# Patient Record
Sex: Male | Born: 1945 | Race: White | Hispanic: No | Marital: Single | Smoking: Current every day smoker
Health system: Southern US, Community
[De-identification: ages and names within clinical notes are randomized; demographics above are authoritative.]

## PROBLEM LIST (undated history)

## (undated) DIAGNOSIS — C73 Malignant neoplasm of thyroid gland: Secondary | ICD-10-CM

## (undated) DIAGNOSIS — N189 Chronic kidney disease, unspecified: Secondary | ICD-10-CM

## (undated) DIAGNOSIS — D369 Benign neoplasm, unspecified site: Secondary | ICD-10-CM

## (undated) DIAGNOSIS — E119 Type 2 diabetes mellitus without complications: Secondary | ICD-10-CM

## (undated) DIAGNOSIS — K644 Residual hemorrhoidal skin tags: Secondary | ICD-10-CM

## (undated) DIAGNOSIS — D126 Benign neoplasm of colon, unspecified: Secondary | ICD-10-CM

## (undated) DIAGNOSIS — J449 Chronic obstructive pulmonary disease, unspecified: Secondary | ICD-10-CM

## (undated) HISTORY — DX: Chronic obstructive pulmonary disease, unspecified: J44.9

## (undated) HISTORY — DX: Residual hemorrhoidal skin tags: K64.4

## (undated) HISTORY — DX: Malignant neoplasm of thyroid gland: C73

## (undated) HISTORY — DX: Benign neoplasm, unspecified site: D36.9

## (undated) HISTORY — DX: Benign neoplasm of colon, unspecified: D12.6

## (undated) HISTORY — PX: THYROIDECTOMY: SHX17

## (undated) HISTORY — DX: Chronic kidney disease, unspecified: N18.9

---

## 2000-03-11 ENCOUNTER — Other Ambulatory Visit: Admission: RE | Admit: 2000-03-11 | Discharge: 2000-03-11 | Payer: Self-pay | Admitting: Internal Medicine

## 2000-03-11 ENCOUNTER — Encounter (INDEPENDENT_AMBULATORY_CARE_PROVIDER_SITE_OTHER): Payer: Self-pay | Admitting: Specialist

## 2002-02-17 ENCOUNTER — Ambulatory Visit (HOSPITAL_BASED_OUTPATIENT_CLINIC_OR_DEPARTMENT_OTHER): Admission: RE | Admit: 2002-02-17 | Discharge: 2002-02-17 | Payer: Self-pay | Admitting: *Deleted

## 2005-01-10 ENCOUNTER — Encounter: Admission: RE | Admit: 2005-01-10 | Discharge: 2005-01-10 | Payer: Self-pay | Admitting: Otolaryngology

## 2005-01-10 IMAGING — CT CT NECK W/ CM
1 of 2 series · 9 of 14 positions shown, 12 images · IV contrast (OMNIPAQUE 75)
Comparison: None.

CLINICAL DATA: Left neck mass.
NECK CT WITH CONTRAST:
TECHNIQUE: Multidetector CT imaging of the neck was performed following the standard protocol during administration of intravenous contrast.
Contrast:  75 cc of Omnipaque 300.

[Series 2: neck · axial · 0.39mm/px · z∈[-32,+181]mm · 9 of 73 slices shown, 12 images]
[im 8/73  soft-tissue]
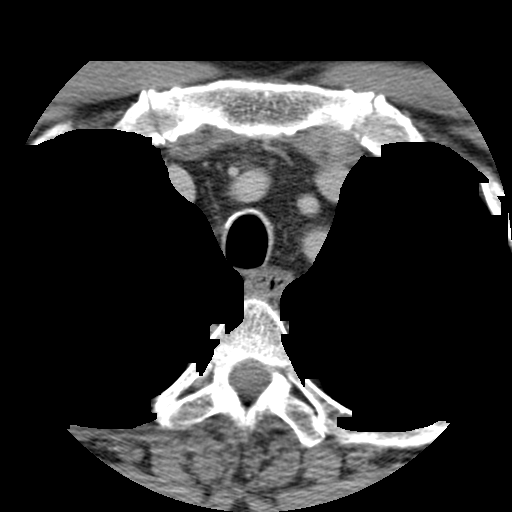
[im 8/73  bone]
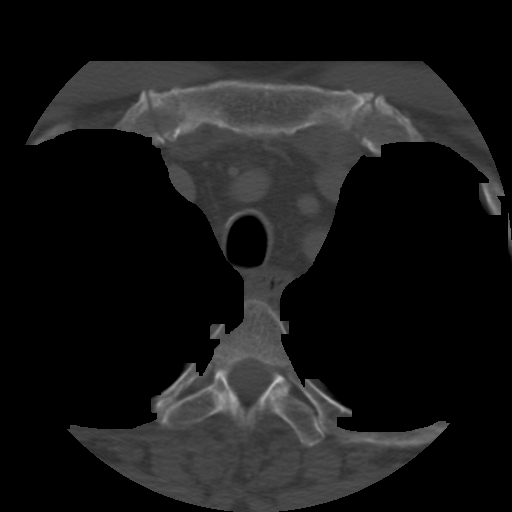
[im 15/73  bone]
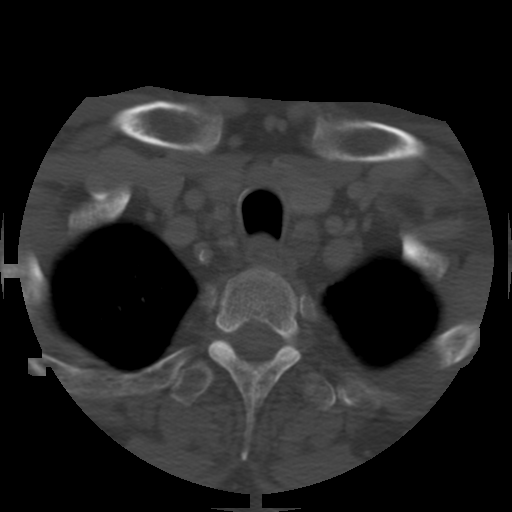
[im 22/73  bone]
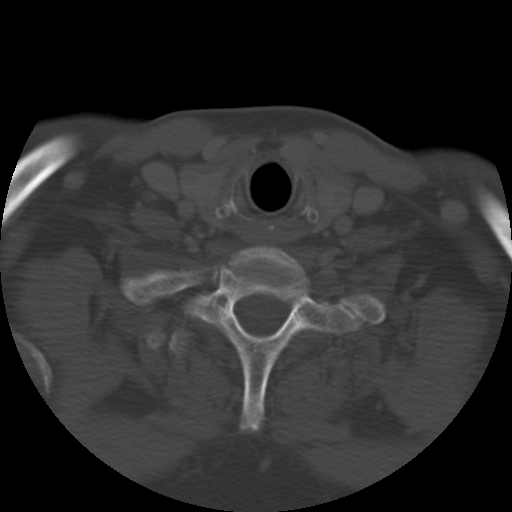
[im 29/73  bone]
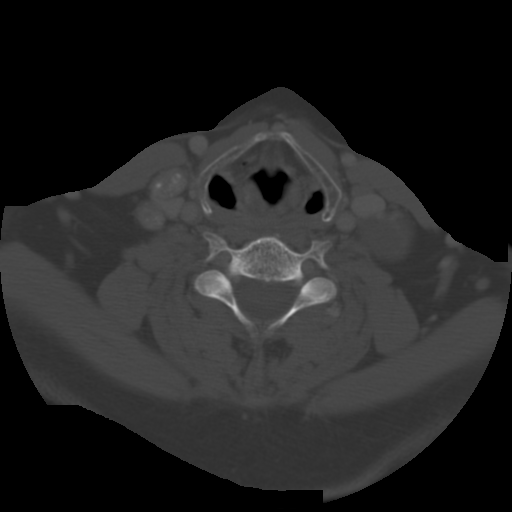
[im 37/73  soft-tissue]
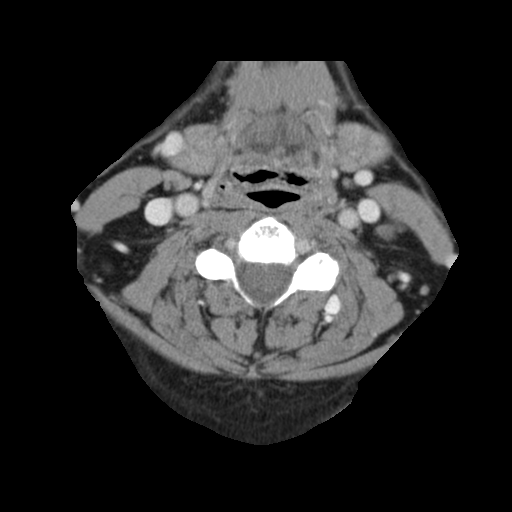
[im 37/73  bone]
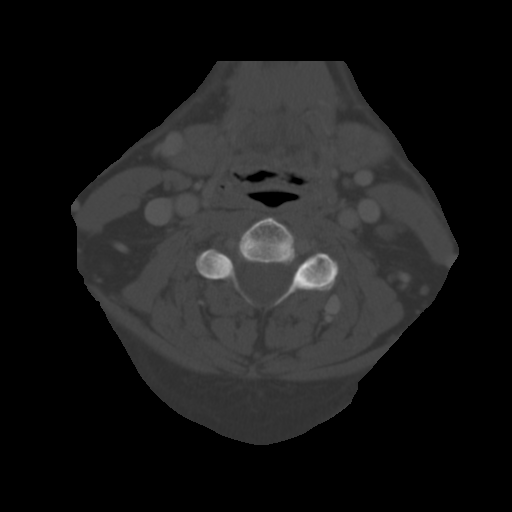
[im 44/73  bone]
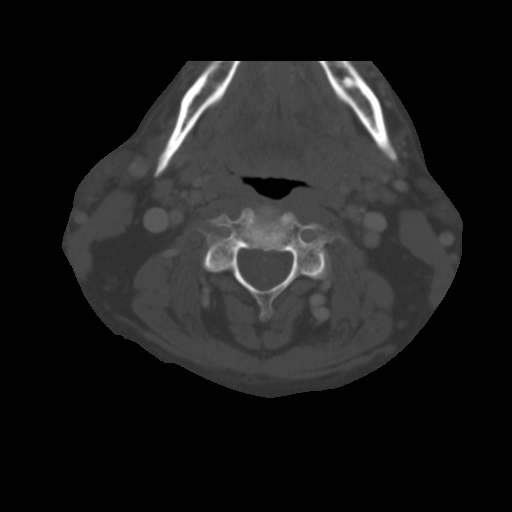
[im 51/73  bone]
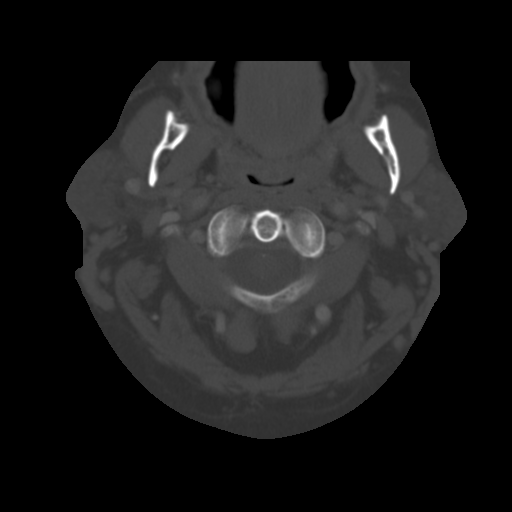
[im 58/73  bone]
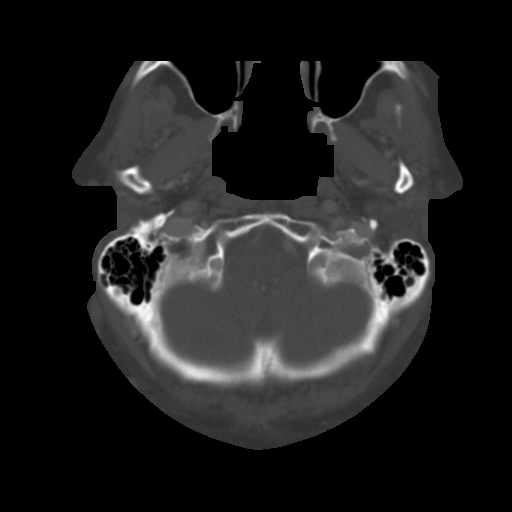
[im 65/73  soft-tissue]
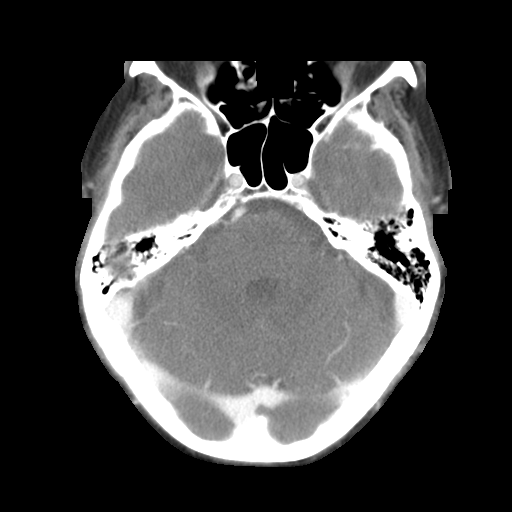
[im 65/73  bone]
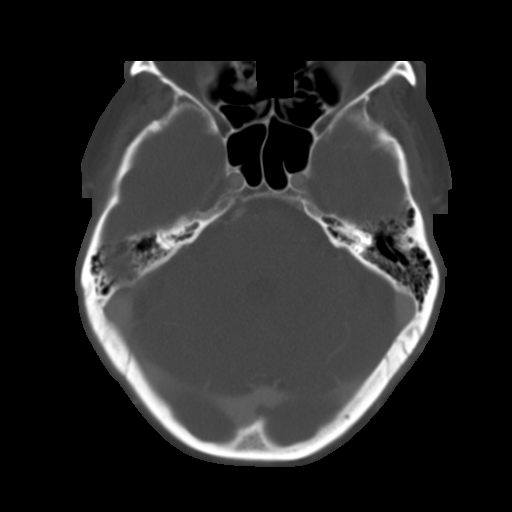

[9 of 14 positions shown; findings below may reference images not displayed]

FINDINGS: CT confirms the presence of a round mass in the left neck which is  at the level of the length of the thyroid cartilage.  The mass is spherical in shape.  It measures approximately 2.7 x 2.6 cm in diameter.  It is, for the most part, homogeneous with Hounsfield units in the high 20s.  However, anteriorly along the lower aspect of the mass, there is either some calcification or enhancement.  The sternocleidomastoid drapes over the mass.  It is directly adjacent to the outer aspect of the jugular vein.  On the contralateral side, there are some enlarged  lymph nodes with stippled calcifications.   The largest measures about 22 x 12 mm ,anterior to the great vessels.  A second node measuring about 9 x 12 mm is lateral to the jugular vein.  These nodes have stippled calcification that looks different from the left side.  Nonetheless, the mass is most likely to be a necrotic lymph node.  It is lower than one would anticipate for a branchial cleft cyst ,although that is still possible.  No other masses are identified.  No lesions of the airway or prevertebral space.  The thyroid gland appears normal except for a tiny calcification seen in the anterior aspect of the isthmus on image #57. This measures about 5 mm.
IMPRESSION: 1.  Left neck mass, as described above.  This is most likely a necrotic lymph node. The etiology of the presumed adenopathy is not apparent.
2.  There are enlarged lymph nodes on the right that have stippled calcification.
3.  No other masses or significant findings.

## 2005-01-22 ENCOUNTER — Encounter: Admission: RE | Admit: 2005-01-22 | Discharge: 2005-01-22 | Payer: Self-pay | Admitting: Otolaryngology

## 2005-01-22 IMAGING — CT CT CHEST W/O CM
1 series · 15 of 33 positions shown, 19 images · IV contrast (agent unspecified)
Comparison: none

CLINICAL DATA: Recent thyroid carcinoma with resection.  Evaluate for metastatic involvement or adenopathy.
 CHEST CT WITHOUT CONTRAST:
TECHNIQUE: Multidetector CT imaging of the chest was performed following the standard protocol without IV contrast.
 On lung window images, no evidence of lung metastases is seen.  No effusion is noted.  The thyroid gland appears normal in size.  There is a prominent node just posterior to the left lobe of thyroid measuring 16 x 9 mm and follow up CT is recommended.  A few small nodes are also noted in the midline just beneath the thyroid.  There is also a partially calcified node on image #16 adjacent to the trachea of 10 x 12 mm, worrisome for metastatic involvement.  No mediastinal or hilar nodes are evident.   On the last cut, there appears to be calcification of the wall of the gallbladder which may indicate porcelain gallbladder.

[Series 2: chest w/o · axial · non-contrast · 0.64mm/px · z∈[-381,-91]mm · 15 of 70 slices shown, 19 images]
[im 6/70  mediastinal]
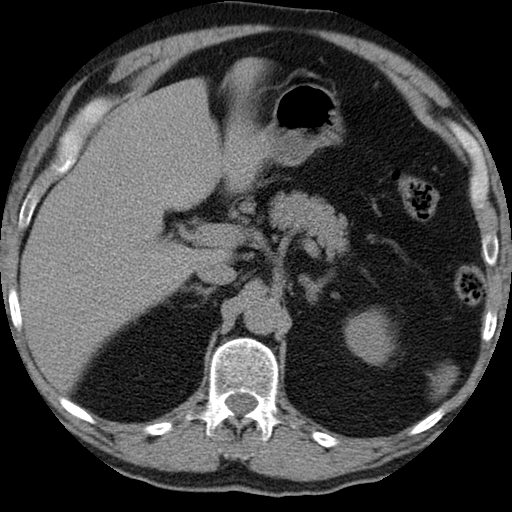
[im 6/70  lung]
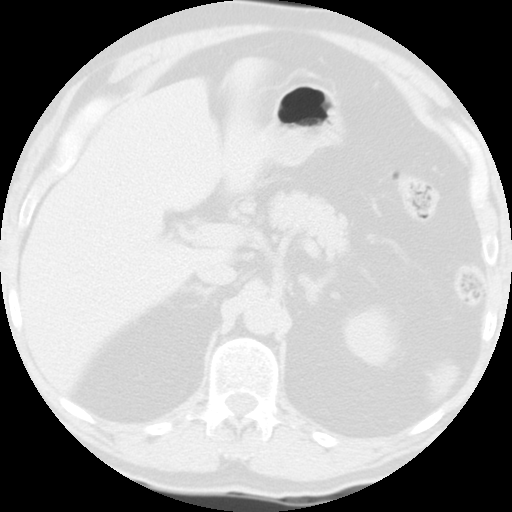
[im 11/70  lung]
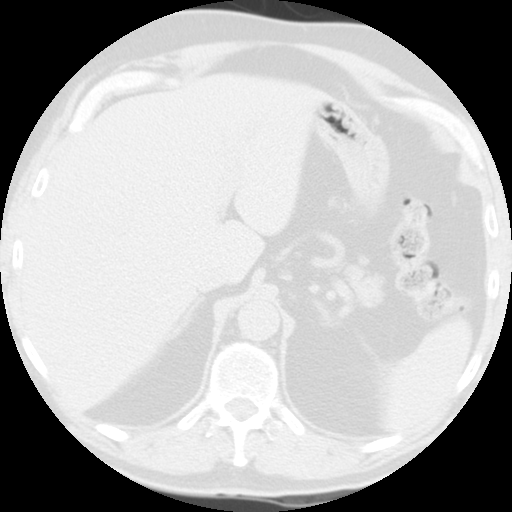
[im 14/70  lung]
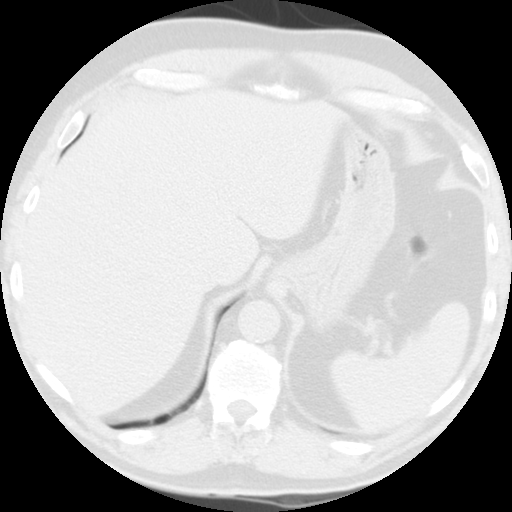
[im 18/70  lung]
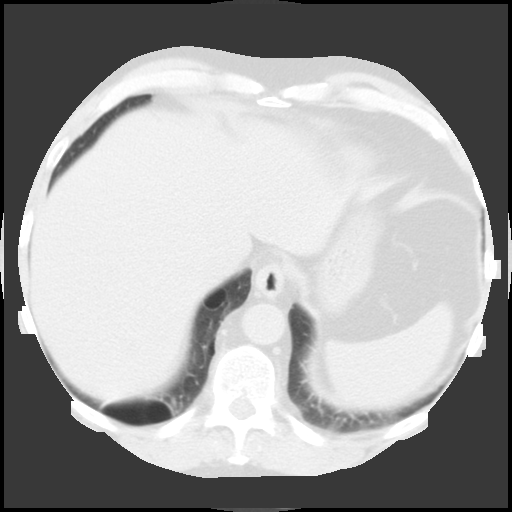
[im 24/70  mediastinal]
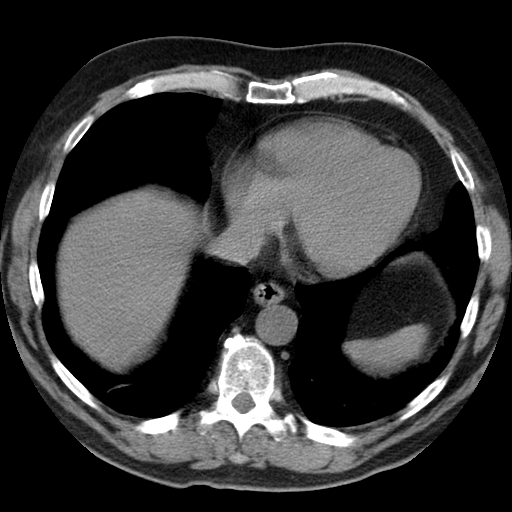
[im 24/70  lung]
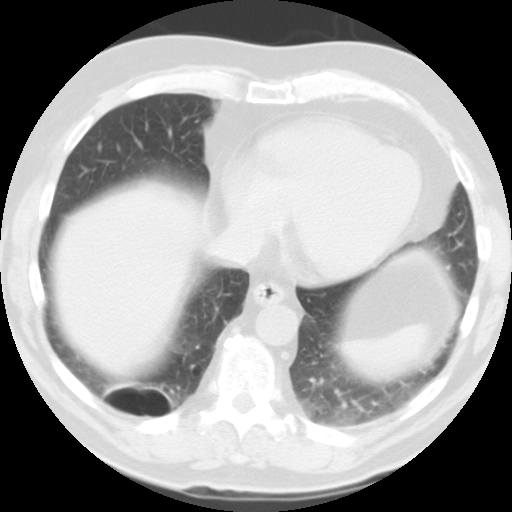
[im 28/70  lung]
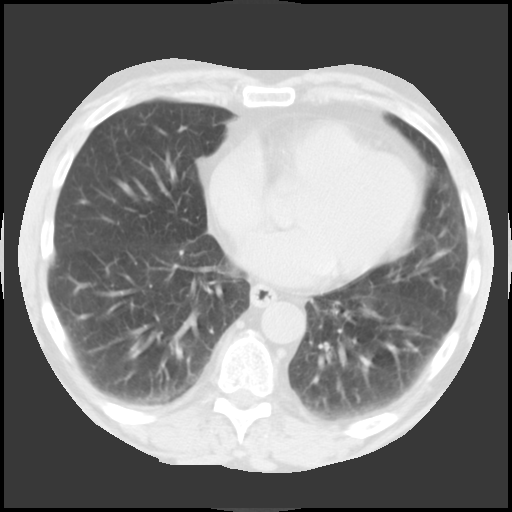
[im 31/70  lung]
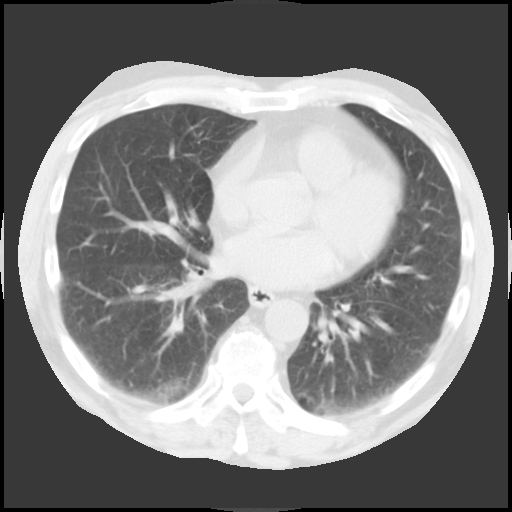
[im 36/70  lung]
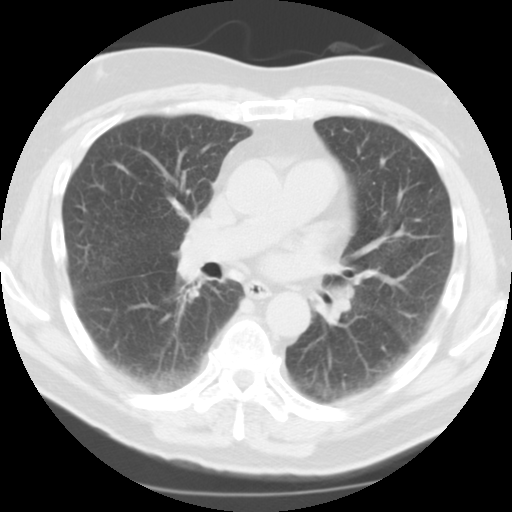
[im 39/70  mediastinal]
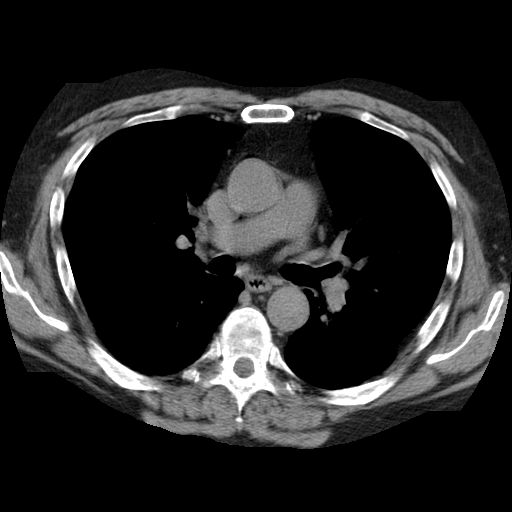
[im 39/70  lung]
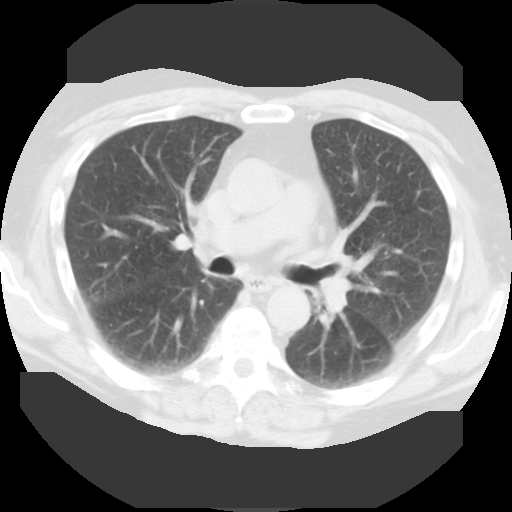
[im 42/70  lung]
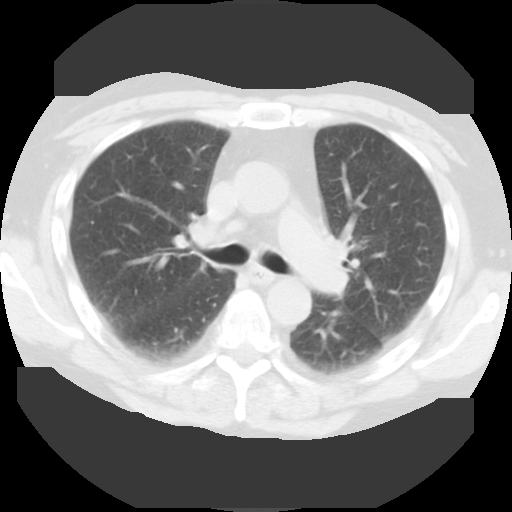
[im 47/70  lung]
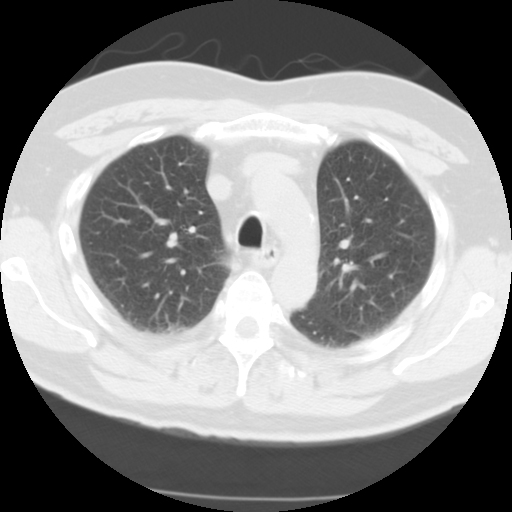
[im 52/70  lung]
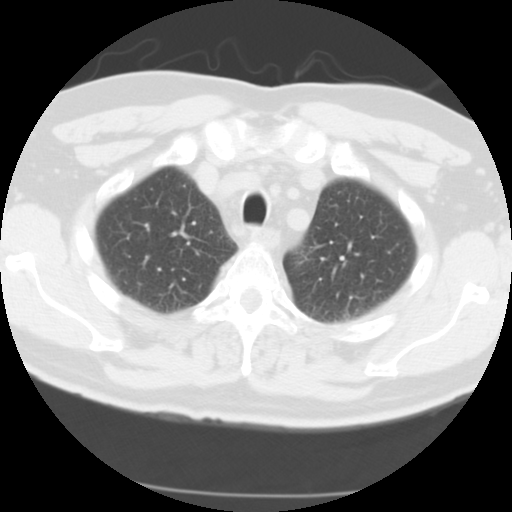
[im 56/70  mediastinal]
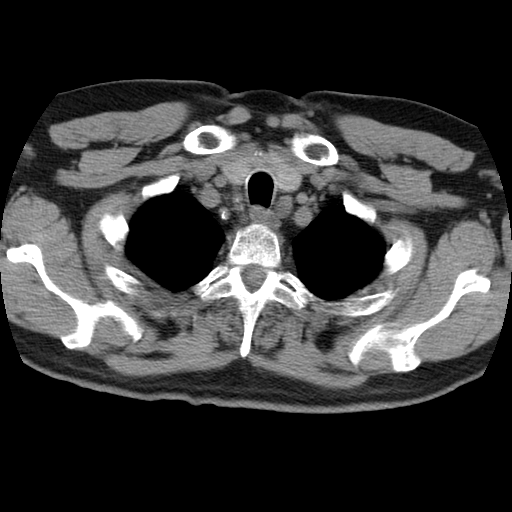
[im 56/70  lung]
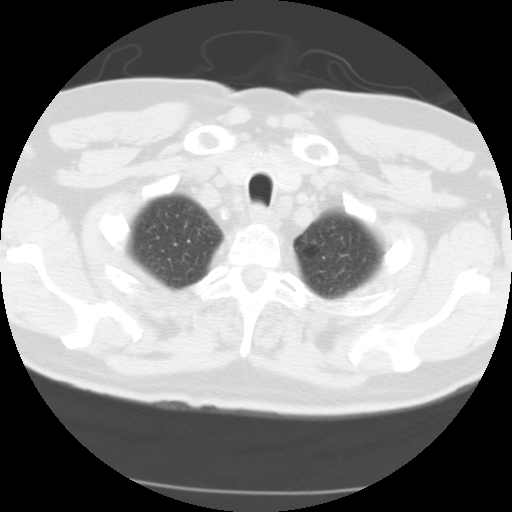
[im 59/70  lung]
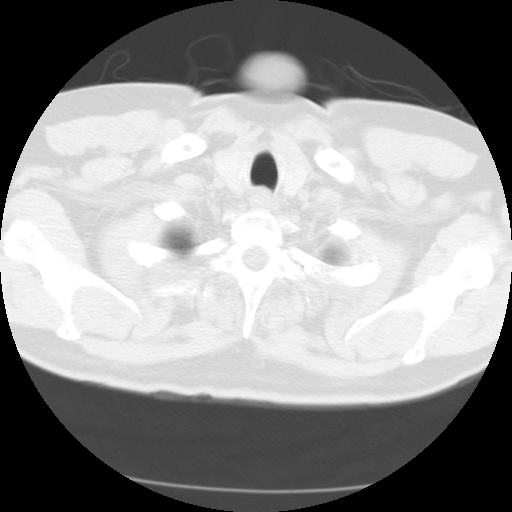
[im 64/70  lung]
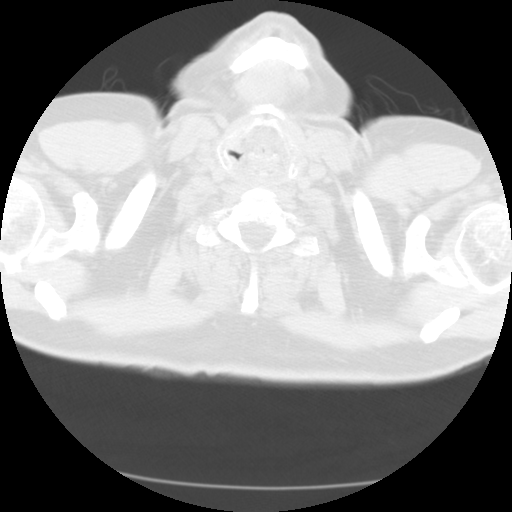

[15 of 33 positions shown; findings below may reference images not displayed]

IMPRESSION: 1.  There are some prominent nodes adjacent and inferior to the thyroid gland, and therefore metastatic adenopathy cannot be excluded.  No mediastinal or hilar adenopathy is seen.
 2.  No lung lesions are seen.
 3.  Question porcelain gallbladder on last image obtained.

## 2005-02-05 ENCOUNTER — Inpatient Hospital Stay (HOSPITAL_COMMUNITY): Admission: RE | Admit: 2005-02-05 | Discharge: 2005-02-07 | Payer: Self-pay | Admitting: Otolaryngology

## 2005-02-05 ENCOUNTER — Encounter (INDEPENDENT_AMBULATORY_CARE_PROVIDER_SITE_OTHER): Payer: Self-pay | Admitting: Specialist

## 2005-04-29 ENCOUNTER — Encounter (HOSPITAL_COMMUNITY): Admission: RE | Admit: 2005-04-29 | Discharge: 2005-07-28 | Payer: Self-pay | Admitting: Endocrinology

## 2005-05-02 ENCOUNTER — Encounter (HOSPITAL_COMMUNITY): Admission: RE | Admit: 2005-05-02 | Discharge: 2005-07-31 | Payer: Self-pay | Admitting: Endocrinology

## 2006-03-31 ENCOUNTER — Encounter: Admission: RE | Admit: 2006-03-31 | Discharge: 2006-03-31 | Payer: Self-pay | Admitting: Otolaryngology

## 2007-09-17 ENCOUNTER — Emergency Department (HOSPITAL_COMMUNITY): Admission: EM | Admit: 2007-09-17 | Discharge: 2007-09-17 | Payer: Self-pay | Admitting: Family Medicine

## 2008-11-17 ENCOUNTER — Encounter (INDEPENDENT_AMBULATORY_CARE_PROVIDER_SITE_OTHER): Payer: Self-pay | Admitting: *Deleted

## 2008-12-14 ENCOUNTER — Ambulatory Visit: Payer: Self-pay | Admitting: Internal Medicine

## 2008-12-28 ENCOUNTER — Encounter: Payer: Self-pay | Admitting: Internal Medicine

## 2008-12-28 ENCOUNTER — Ambulatory Visit: Payer: Self-pay | Admitting: Internal Medicine

## 2009-01-01 ENCOUNTER — Encounter: Payer: Self-pay | Admitting: Internal Medicine

## 2009-12-19 ENCOUNTER — Encounter: Admission: RE | Admit: 2009-12-19 | Discharge: 2009-12-19 | Payer: Self-pay | Admitting: Family Medicine

## 2009-12-19 IMAGING — US US ABDOMEN COMPLETE
1 series · 13 of 25 positions shown · non-contrast
Comparison: None.

CLINICAL DATA: Abdominal pain and bloating, history of thyroid
carcinoma with  thyroidectomy previously

COMPLETE ABDOMINAL ULTRASOUND

[Series 1: us abdomen complete · 0.43mm/px · 13 of 81 slices shown]
[im 1/81]
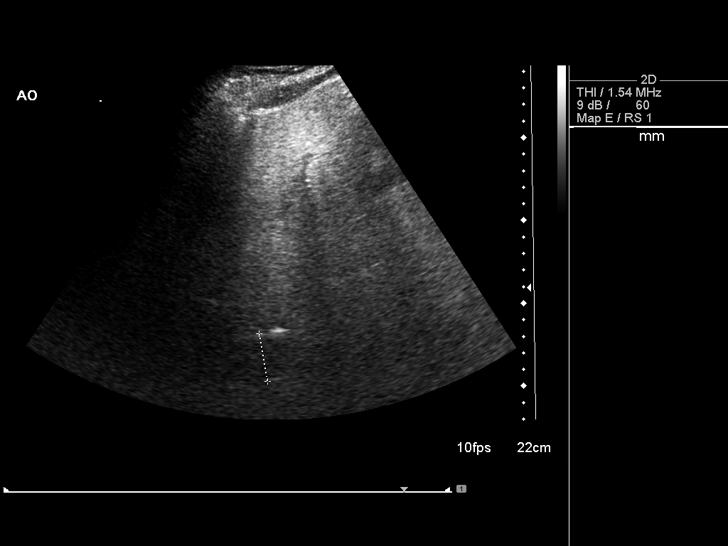
[im 7/81]
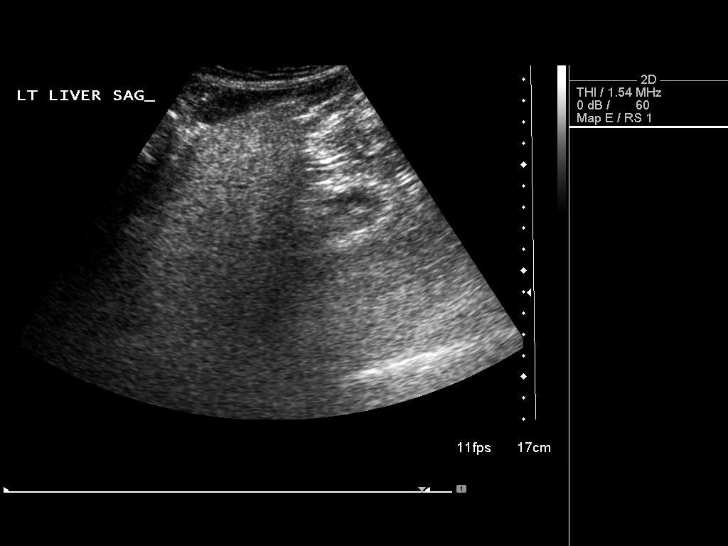
[im 14/81]
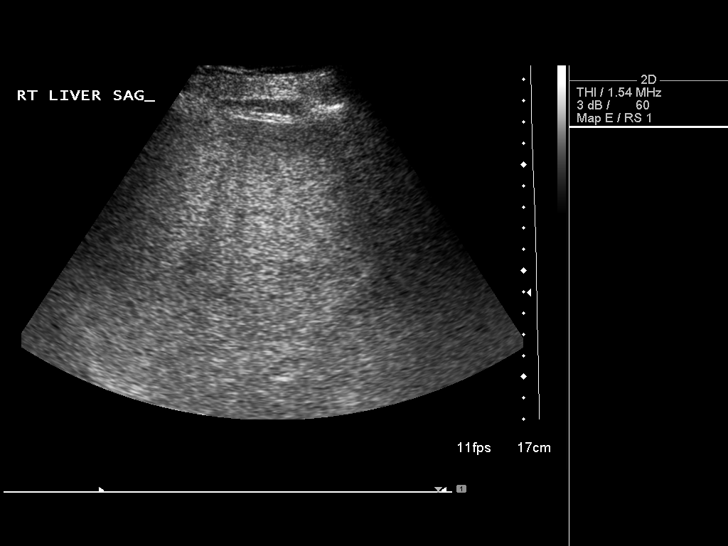
[im 21/81]
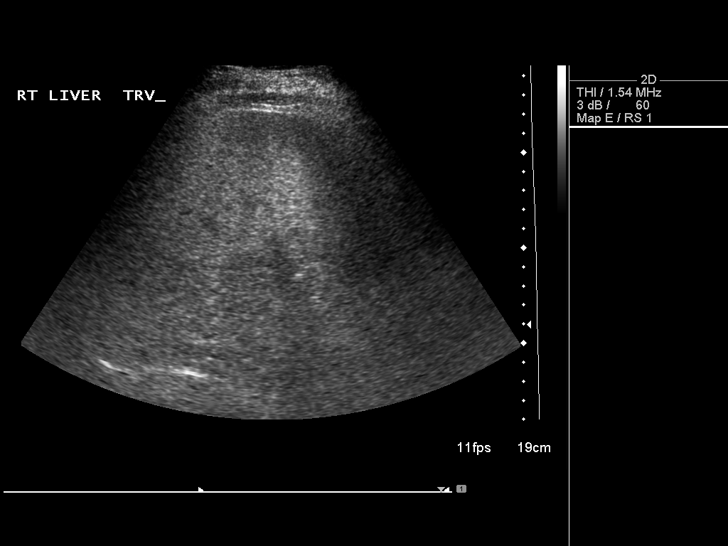
[im 27/81]
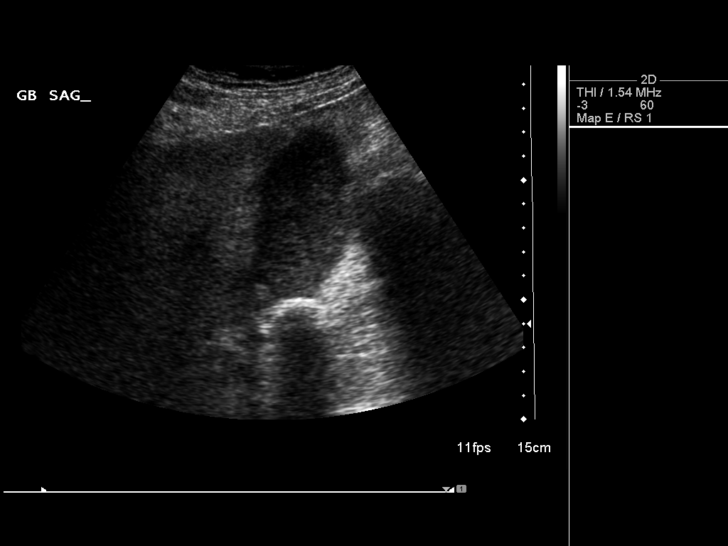
[im 34/81]
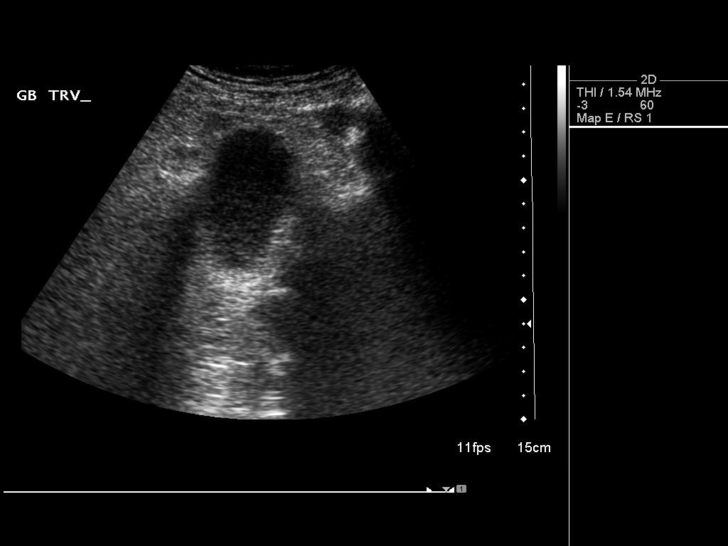
[im 41/81]
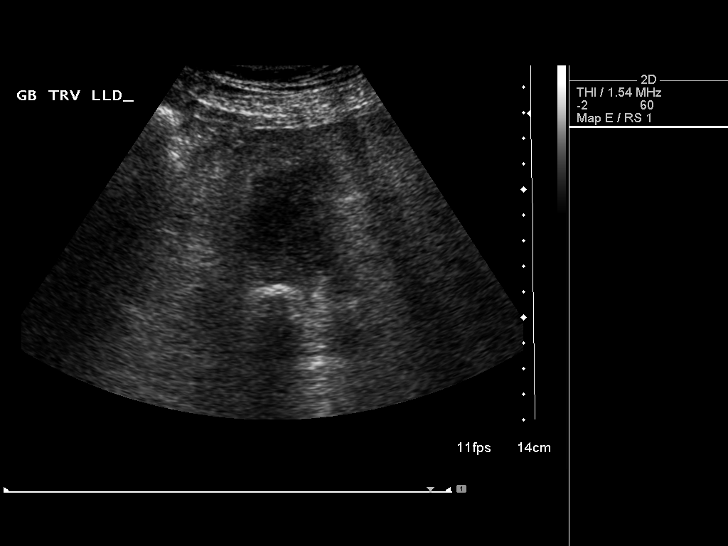
[im 47/81]
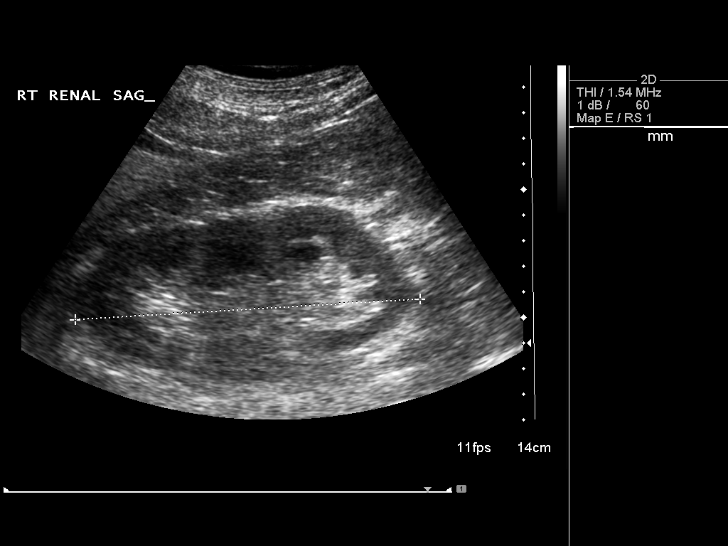
[im 54/81]
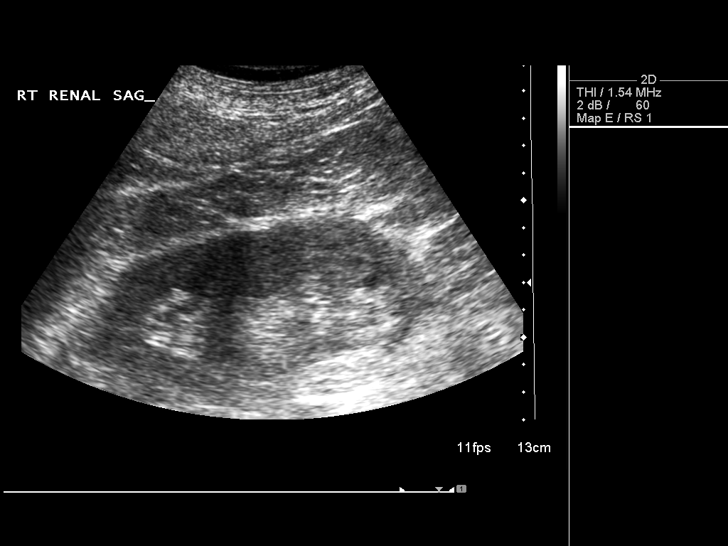
[im 61/81]
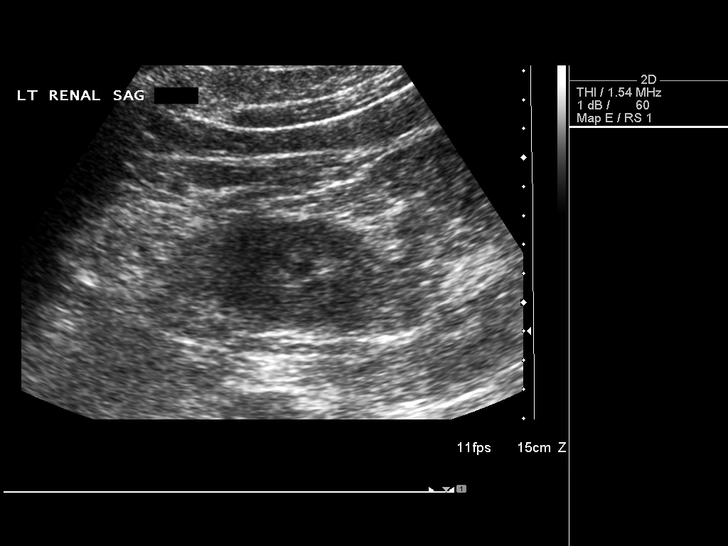
[im 67/81]
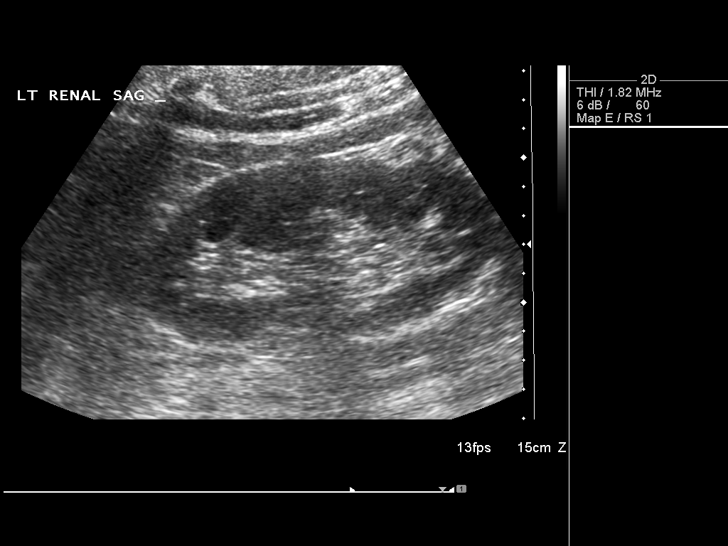
[im 74/81]
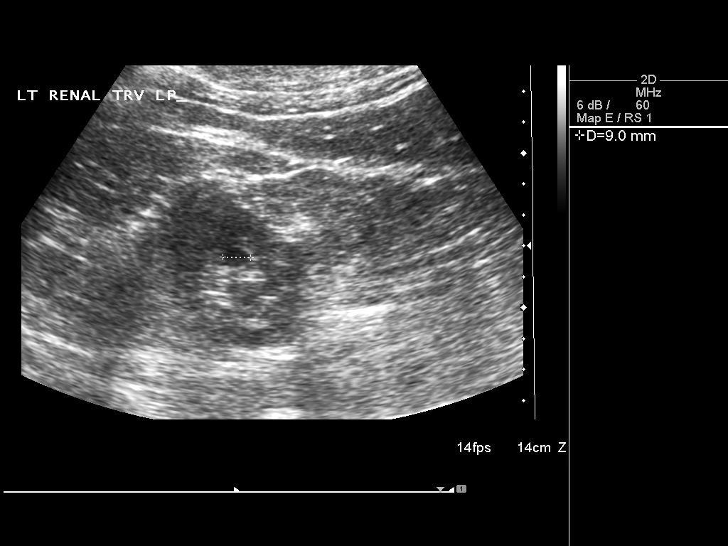
[im 81/81]
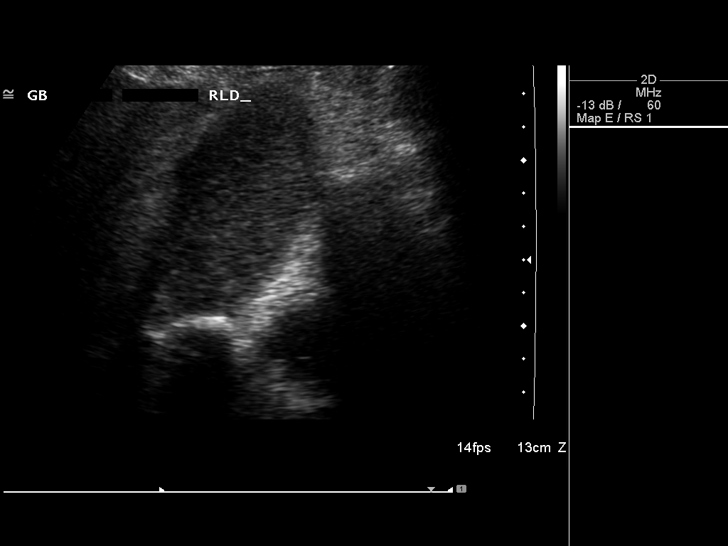

[13 of 25 positions shown; findings below may reference images not displayed]

FINDINGS: Gallbladder:  There is a 1.9 cm gallstone which appears to be
lodged in the neck of the gallbladder with gallbladder sludge
present.  However, no pain is present over the gallbladder with
compression.  The gallbladder wall is not thickened currently.

Common bile duct:  The common bile duct is normal measuring 6.4 mm
in diameter.

Liver:  The liver is diffusely echogenic consistent with fatty
infiltration.  No ductal dilatation is seen.

IVC:  The IVC is obscured by bowel gas.

Pancreas:  The pancreas is largely obscured by bowel gas.

Spleen:  The spleen is normal measuring six point of centimeters
sagittally.

Right Kidney:  No hydronephrosis is seen.  The right kidney
measures 13.5 cm sagittally.  A cyst is noted in the lower pole of
1.3 x 0.9 x 1.2 cm.

Left Kidney:  No hydronephrosis.  The left kidney measures 13.4 cm
sagittally.  Too small cysts are present no larger than 1.3 cm in
maximum diameter.

Abdominal aorta:  The abdominal aorta is normal in caliber although
distally obscured by bowel gas.
IMPRESSION: 1.  1.9 cm gallstone appears lodged in the neck of the gallbladder
with gallbladder sludge.  No pain is present over gallbladder with
compression.
2.  Diffuse fatty infiltration of the liver.  No ductal dilatation.
3.  The pancreas and portions of the abdominal aorta are obscured
by bowel gas.

## 2010-05-25 ENCOUNTER — Encounter: Payer: Self-pay | Admitting: Endocrinology

## 2010-09-20 NOTE — H&P (Signed)
NAME:  Matthew [name], Matthew [name]                           ACCOUNT NO.:  [id number]   MEDICAL RECORD NO.:  [id number]                   PATIENT TYPE:  AMB   LOCATION:  DSC                                  FACILITY:  [facility]   PHYSICIAN:  Reynolds Bowl, M.D.                 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   OPERATION/PROCEDURE:  Arthroscopy, partial medial meniscectomy.   DESCRIPTION OF PROCEDURE:  The patient was given general anesthetic while in  the supine position on the operating table.   A proximal pneumatic tourniquet was put in place.   He was prepped with Duraprep from the tips of the toes to the tourniquet  area and then draped in the usual manner, following which the leg after a  period of elevation and use of an Esmarch was exsanguinated and a proximal  pneumatic tourniquet elevated to 300 mmHg.   I established anterolateral and anteromedial portals and examined the knee.  The patella appeared to have only mild changes. The femoral trochlea had at  least grade III changes, and the lateral side of the knee appeared very  pristine. The anterior cruciate appeared normal. Attention was directed back  to the medial side. On the medial side, I could rub the articular cartilage  with a probe, and the cuticle of articular cartilage would be abraded off.  In general, it was very soft. The posterior flap tear was identified and  resected using various punches and the rotary meniscotome. This was resected  back to normal tissue, or stable tissue. I spent time irrigating all about  the knee including the  suprapatellar pouch to remove bits of debris. Having  completed that, the instruments were removed. Marcaine with epinephrine was  injected in the joint. Portals were approximated with 5-0 nylon. A bulky  dressing was applied, proximal pneumatic tourniquet deflated, and patient  returned to recovery room in good condition.   PLAN:  The plan at home will be the patient will take Vicodin for pain, use  cold packs, do frequent quad sets and ankle pumps, house rest with the leg  elevated. Call with concerns, otherwise see me in the office approximately  Monday as planned.                                               Reynolds Bowl, M.D.    JWK/MEDQ  D:  02/17/2002  T:  02/18/2002  Job:  [id number]

## 2010-09-20 NOTE — Op Note (Signed)
Matthew [name], Matthew [name]                 ACCOUNT NO.:  [id number]   MEDICAL RECORD NO.:  [id number]          PATIENT TYPE:  INP   LOCATION:  2867                         FACILITY:  [facility]   PHYSICIAN:  Kristine Garbe. Ezzard Standing, M.D.DATE OF BIRTH:  [birth year]   DATE OF PROCEDURE:  02/05/2005  DATE OF DISCHARGE:                                 OPERATIVE REPORT   PREOPERATIVE DIAGNOSIS:  Metastatic papillary carcinoma.   POSTOPERATIVE DIAGNOSIS:  Metastatic papillary carcinoma.   OPERATION:  Total thyroidectomy, limited right neck dissection, excision of  bilateral paratracheal lymph nodes.   SURGEON:  Kristine Garbe. Ezzard Standing, M.D.   ASSISTANT:  Hermelinda Medicus, M.D.   ANESTHESIA:  General endotracheal anesthesia.   ESTIMATED BLOOD LOSS:  100 mL.   COMPLICATIONS:  None.   BRIEF CLINICAL NOTE:  Matthew [name] is a 65 year old gentleman who has had  enlarged neck nodes now for several months.  He underwent excision initially  of a left neck node that measured approximately 3-4 cm in size in the first  part of September and final pathology report of this neck node revealed  metastatic papillary carcinoma.  On repeat and review of his CT scan, he has  several calcified lymph nodes, two in the right lower neck, and several  paratracheal lymph nodes.  He has no obvious thyroid mass but does have some  areas of calcium stipulations within the thyroid gland.  He is taken to the  operating room at this time for total thyroidectomy and excision of any  grossly enlarged lymph nodes in the right neck and paratracheal area  bilaterally.   DESCRIPTION OF PROCEDURE:  After adequate endotracheal anesthesia, the  patient received 1 gram Ancef IV preoperatively as well as 8 mg Decadron IV  preoperatively.  The neck was prepped with Betadine and draped out with  sterile towels.  The proposed incision site was marked out, injected with  Xylocaine with epinephrine.  Using a scalpel, an incision was made and  the  subplatysmal flaps were elevated superiorly and inferiorly.  The strap  muscles were divided in the midline.  There were a couple of enlarged lymph  nodes in the anterior tragal pad of lymph nodes.  These were excised and  sent as a specimen.  The thyroid isthmus was very enlarged.  This was  divided and first the right lobe of the thyroid was removed.  The dissection  was carried around the strap muscles which were resected off the thyroid  lobe on the right side.  Dissection was carried out inferiorly.  I  identified the parathyroid gland inferiorly and preserved this.  The  recurrent laryngeal nerve was identified inferiorly and followed superiorly  as the thyroid lobe was dissected out.  The superior thyroid vessels were  identified and ligated with 2-0 silk sutures and divided.  The lateral  thyroid veins were cauterized.  The thyroid lobe was resected and sent to  pathology along with the thyroid isthmus.  Next, the left thyroid lobe was  dissected out in a similar fashion.  Again, the recurrent nerve was  identified inferiorly.  The superior thyroid vessels and inferior thyroid  vessels were ligated with 2-0 silk sutures and divided.  The left thyroid  lobe was dissected out the tracheoesophageal groove with the recurrent nerve  in site.  After removing the thyroid lobes bilaterally, there were two  enlarged lymph nodes in the left paratracheal area which were dissected out  from the fat.  Bipolar cautery was used for hemostasis.  On the right side,  there were multiple enlarged paratracheal nodes which were dissected out and  sent as right paratracheal nodes from the right side.  Hemostasis was  obtained with bipolar cautery.  Following completion of the thyroidectomy  and excision of the paratracheal nodes, there were two enlarged nodes in the  right neck next to the jugular vein.  Dissection was carried out anterior to  the sternocleidomastoid muscle, the first node was  anterior to the jugular  vein and this was dissected out and sent to pathology.  There was another  node deep to the jugular vein, the jugular vein was dissected out, was  retracted medially and the other large lymph node was dissected off the back  side of the jugular vein.  There were no other palpable enlarged lymph nodes  in the neck or paratracheal area.  Hemostasis was obtained.  A large Hemovac  drain was brought through a separate stab incision.  The wound was then  closed with 3-0 chromic suture subcutaneously and staples to reapproximate  the skin edges.  A dressing was applied.  The patient was awakened from  anesthesia and transferred to the recovery room postop doing well.   DISPOSITION:  Will admit the patient to the hospital for follow up and  perioperative IV antibiotics.  We will need to follow perioperative calcium.  Will probably be able to discharge him in two days.           ______________________________  Kristine Garbe Ezzard Standing, M.D.     CEN/MEDQ  D:  02/05/2005  T:  02/05/2005  Job:  [id number]   cc:   Hermelinda Medicus, M.D.  Fax: [phone]   Windle Guard, M.D.  Fax: [phone]   Tera Mater. Evlyn Kanner, M.D.  Fax: [phone]

## 2010-09-20 NOTE — Op Note (Signed)
Matthew [name], Matthew [name]                 ACCOUNT NO.:  [id number]   MEDICAL RECORD NO.:  [id number]          PATIENT TYPE:  INP   LOCATION:  2867                         FACILITY:  [facility]   PHYSICIAN:  Kristine Garbe. Ezzard Standing, M.D.DATE OF BIRTH:  12/02/1945   DATE OF PROCEDURE:  02/05/2005  DATE OF DISCHARGE:                                 OPERATIVE REPORT           ______________________________  Kristine Garbe. Ezzard Standing, M.D.     CEN/MEDQ  D:  02/05/2005  T:  02/05/2005  Job:  [id number]

## 2010-10-28 NOTE — Letter (Signed)
Summary: Recall Colonoscopy Letter  [facility]  [address] [place], [place] [address]   Phone: [phone]  Fax: [phone]      November 17, 2008 MRN: [id number]   [place] [address] [place], [place]  [address]   Dear Mr. Ruggerio,   According to your medical record, it is time for you to schedule a Colonoscopy. The American Cancer Society recommends this procedure as a method to detect early colon cancer. Patients with a family history of colon cancer, or a personal history of colon polyps or inflammatory bowel disease are at increased risk.  This letter has beeen generated based on the recommendations made at the time of your procedure. If you feel that in your particular situation this may no longer apply, please contact our office.  Please call our office at [phone] to schedule this appointment or to update your records at your earliest convenience.  Thank you for cooperating with [place] to provide you with the very best care possible.   Sincerely,  Wilhemina Bonito. Marina Goodell, M.D  [facility] [phone]

## 2011-04-18 ENCOUNTER — Telehealth: Payer: Self-pay | Admitting: Internal Medicine

## 2011-04-18 NOTE — Telephone Encounter (Signed)
Pt states that he is having some bright red bleeding from his rectum. Pt also states he is having some pain and he thinks the pain is from a gallstone that was found on an ultrasound. Pt wanted to schedule a colon and have the gallstone removed. Let pt know Dr. Marina Goodell needed to see him to see if he needed any procedures done. Pt scheduled to see Dr. Marina Goodell 04/25/11@4pm . Pt aware of appt date and time.

## 2011-04-25 ENCOUNTER — Ambulatory Visit (INDEPENDENT_AMBULATORY_CARE_PROVIDER_SITE_OTHER): Payer: Medicare Other | Admitting: Internal Medicine

## 2011-04-25 ENCOUNTER — Encounter: Payer: Self-pay | Admitting: Internal Medicine

## 2011-04-25 DIAGNOSIS — Z860100 Personal history of colon polyps, unspecified: Secondary | ICD-10-CM

## 2011-04-25 DIAGNOSIS — K649 Unspecified hemorrhoids: Secondary | ICD-10-CM

## 2011-04-25 DIAGNOSIS — K625 Hemorrhage of anus and rectum: Secondary | ICD-10-CM

## 2011-04-25 MED ORDER — HYDROCORTISONE ACETATE 25 MG RE SUPP: 25.0000 mg | Freq: Every evening | RECTAL | Status: AC | PRN

## 2011-04-25 NOTE — Patient Instructions (Signed)
We have sent the following medications to your pharmacy for you to pick up at your convenience:  Anusol suppositories  You have been given samples of Metamucil to try.  This can be obtained over the counter if it works for you

## 2011-04-25 NOTE — Progress Notes (Signed)
HISTORY OF PRESENT ILLNESS:  Matthew [name] is a 65 y.o. male who is followed in this office for a history of adenomatous colon polyps and a family history of colon cancer. He is undergone multiple prior colonoscopies. Last colonoscopy in August of 2010 revealing diminutive adenomatous polyp which was removed and internal/external hemorrhoids. He presents today for evaluation of rectal bleeding. About one week ago he noticed bright red blood per rectum with bowel movements. Blood on the tissue. No associated rectal pain. No new, pain. Describes his bowel habits is regular. He was concerned. He has had less bleeding in the past few days. None this morning. No other complaints.  REVIEW OF SYSTEMS:  All non-GI ROS negative.   Past Medical History  Diagnosis Date  . Adenomatous polyps   . Hemorrhoids   . External hemorrhoids   . Thyroid cancer   . Benign neoplasm of colon     No past surgical history on file.  Social History Matthew [name]  reports that he has been smoking.  He has never used smokeless tobacco. He reports that he drinks alcohol. He reports that he does not use illicit drugs.  family history includes Breast cancer in his mother; Heart attack in his father; and Hypertension in his brother, father, and mother.  There is no history of Colon cancer.  No Known Allergies     PHYSICAL EXAMINATION: Vital signs: BP 150/82  Pulse 120  Ht 5\' 11"  (1.803 m)  Wt 245 lb (111.131 kg)  BMI 34.17 kg/m2  Constitutional: generally well-appearing, no acute distress Psychiatric: alert and oriented x3, cooperative Eyes: extraocular movements intact, anicteric, conjunctiva pink Mouth: oral pharynx moist, no lesions Neck: supple no lymphadenopathy Cardiovascular: heart regular rate and rhythm, no murmur Lungs: clear to auscultation bilaterally Abdomen: soft, nontender, nondistended, no obvious ascites, no peritoneal signs, normal bowel sounds, no organomegaly Rectal: Friable internal  hemorrhoids Extremities: no lower extremity edema bilaterally Skin: no lesions on visible extremities Neuro: No focal deficits.   ASSESSMENT:  #1. Recurrent minor rectal bleeding due to internal hemorrhoids #2. History of adenomatous colon polyps   PLAN:  #1. Metamucil 2 tablespoons daily and 14 ounces of water #2. Anusol-HC suppositories at night when necessary. Prescribed. #3. Surveillance colonoscopy due around August 2015 #4. GI followup when necessary

## 2013-12-01 ENCOUNTER — Encounter: Payer: Self-pay | Admitting: Internal Medicine

## 2014-06-14 ENCOUNTER — Encounter: Payer: Self-pay | Admitting: Internal Medicine

## 2014-07-31 ENCOUNTER — Encounter: Payer: Self-pay | Admitting: Internal Medicine

## 2014-08-09 ENCOUNTER — Encounter: Payer: Medicare Other | Admitting: Internal Medicine

## 2015-03-27 ENCOUNTER — Encounter: Payer: Self-pay | Admitting: Internal Medicine

## 2016-07-27 IMAGING — US US RENAL
1 series · 14 of 25 positions shown · non-contrast
Comparison: None.

CLINICAL DATA: Chronic kidney disease

EXAM:
RENAL / URINARY TRACT ULTRASOUND COMPLETE

[Series 1: us renal · 0.30mm/px · 14 of 40 slices shown]
[im 1/40]
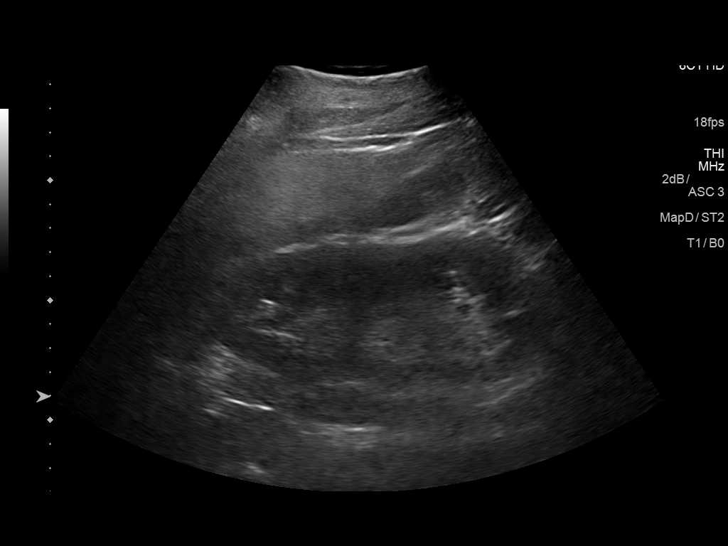
[im 4/40]
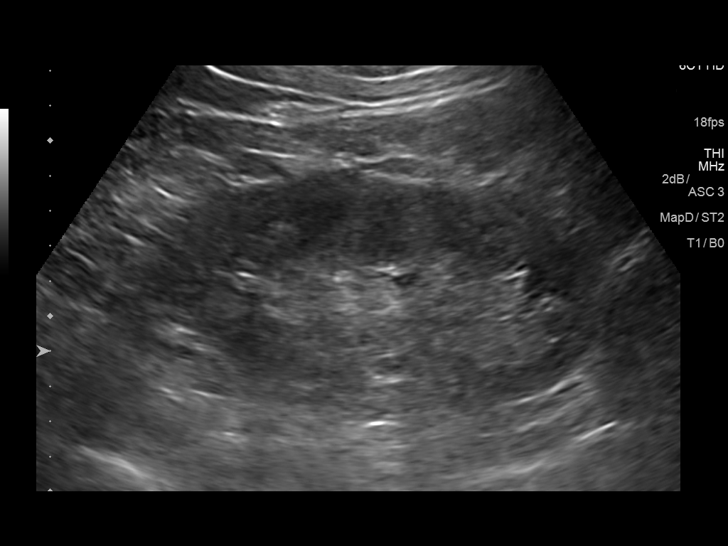
[im 7/40]
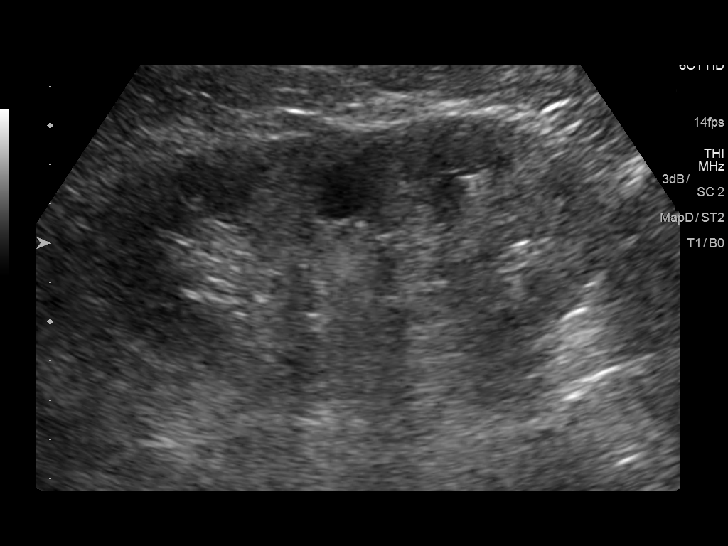
[im 10/40]
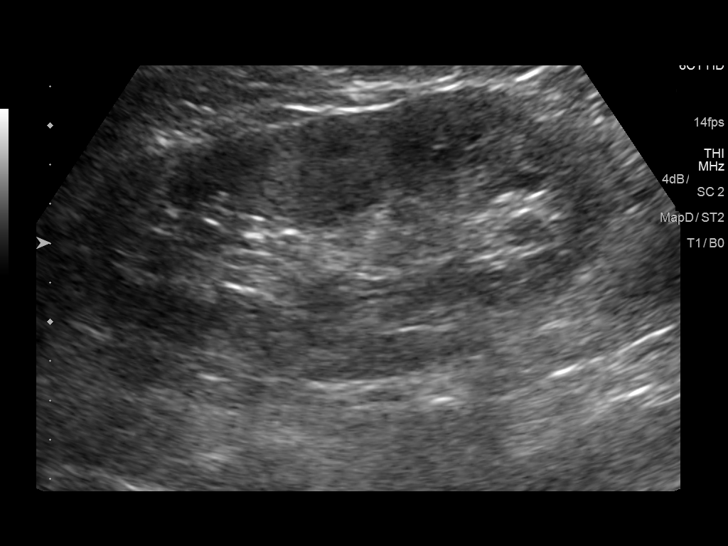
[im 14/40]
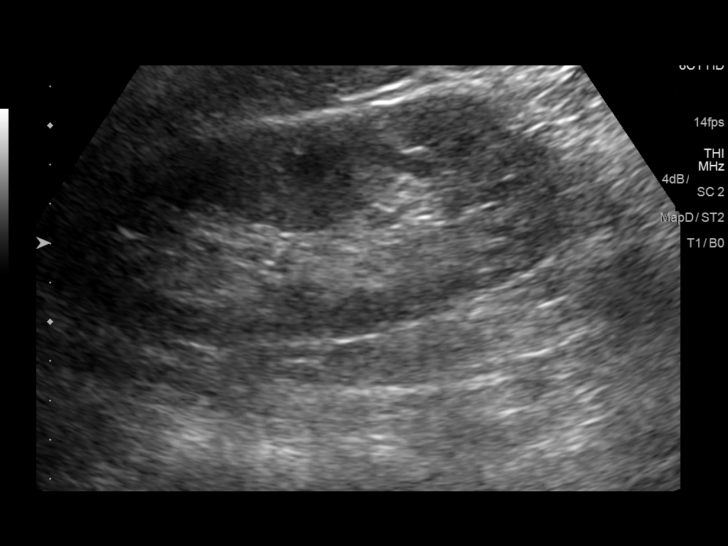
[im 15/40]
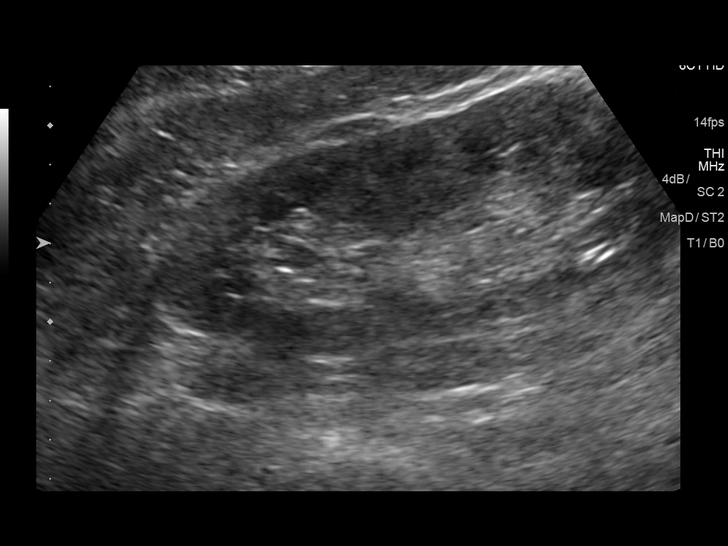
[im 18/40]
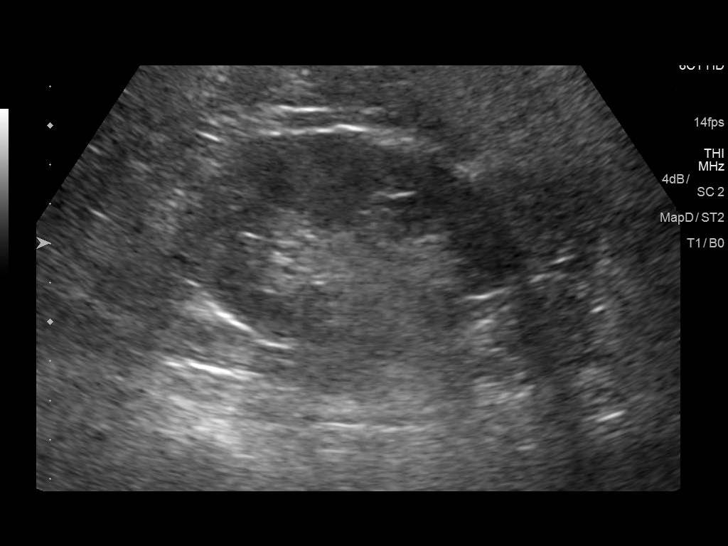
[im 22/40]
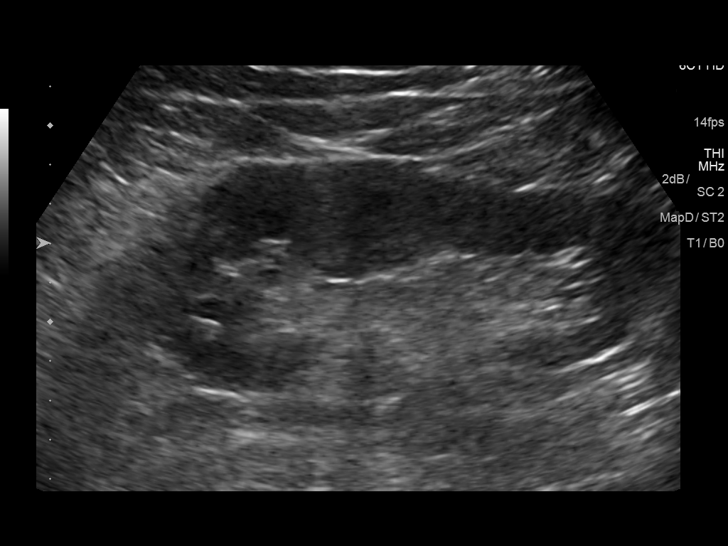
[im 25/40]
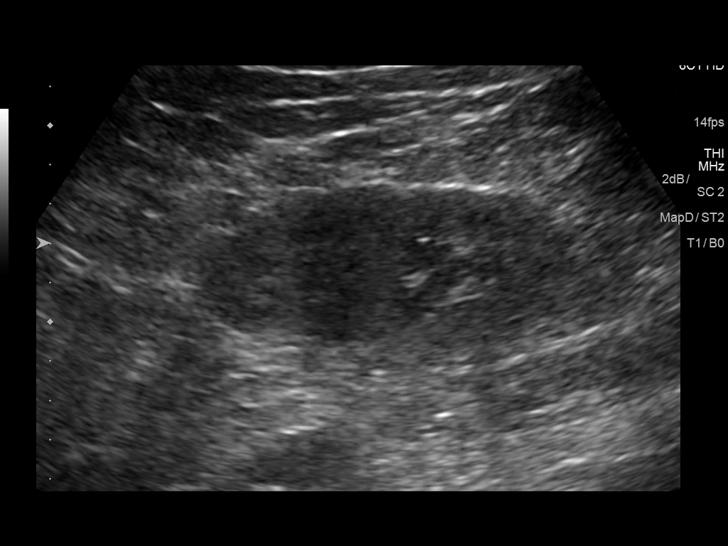
[im 27/40]
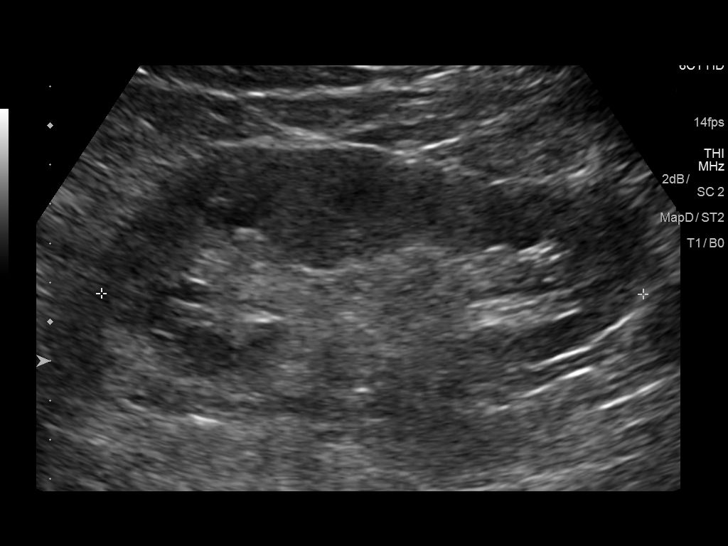
[im 30/40]
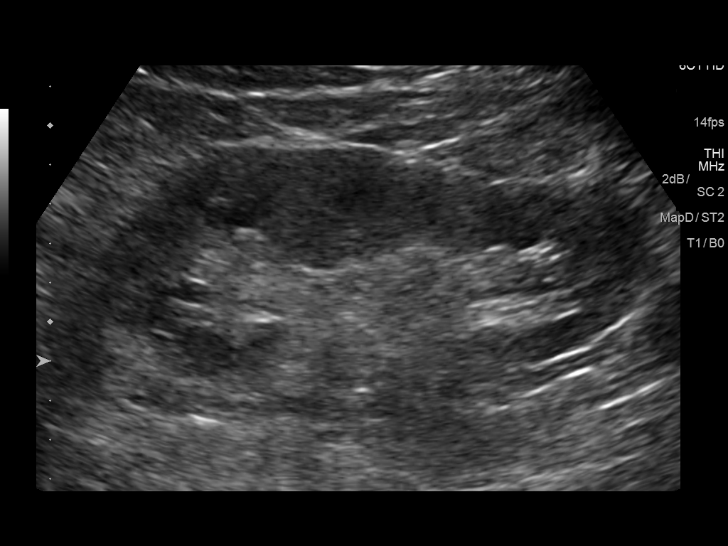
[im 33/40]
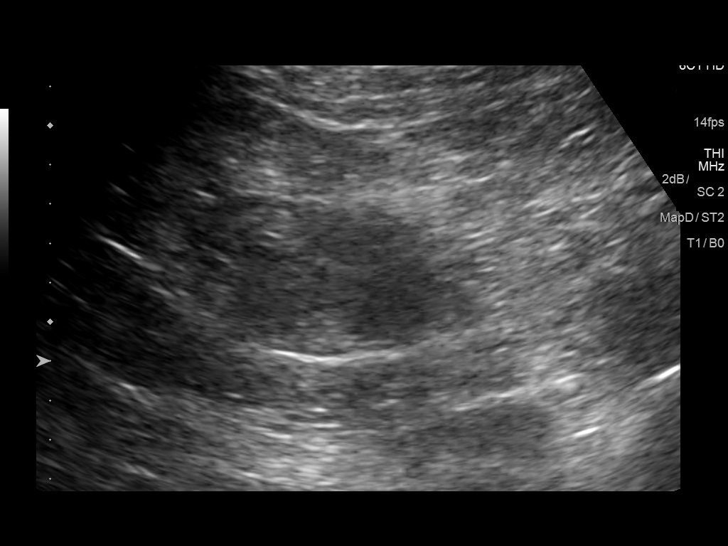
[im 36/40]
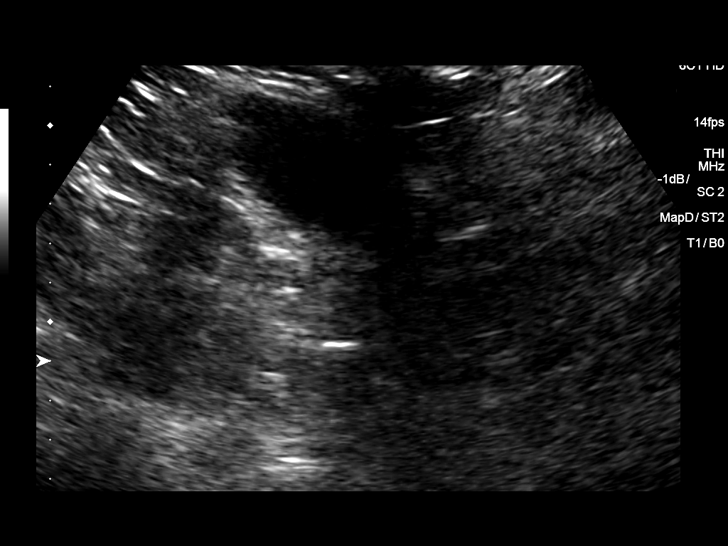
[im 40/40]
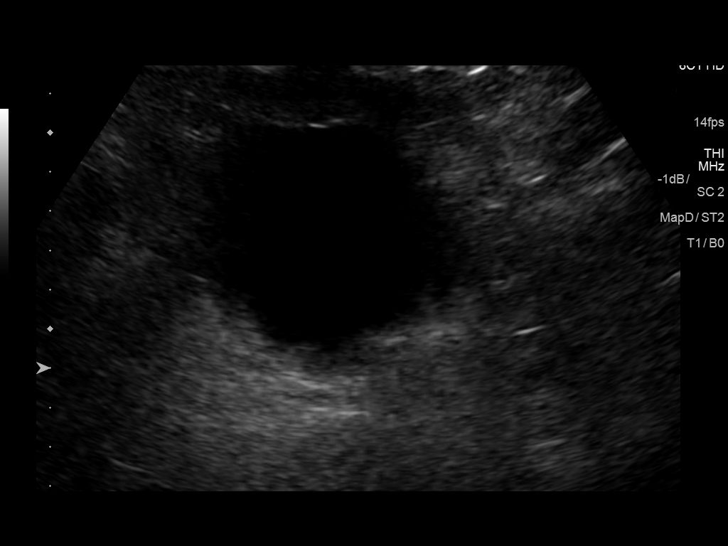

[14 of 25 positions shown; findings below may reference images not displayed]

FINDINGS: Right Kidney:

Length: 13.2 cm. Echogenicity within normal limits. No
hydronephrosis visualized. Cyst in the midpole measuring 1.3 cm.

Left Kidney:

Length: 13.7 cm. Echogenicity within normal limits. No mass or
hydronephrosis visualized.

Bladder:

Appears normal for degree of bladder distention.
IMPRESSION: 1.  Negative for hydronephrosis.

2.  Small cyst in the mid right kidney.

## 2017-03-12 ENCOUNTER — Other Ambulatory Visit: Payer: Self-pay | Admitting: Nephrology

## 2017-03-12 DIAGNOSIS — N182 Chronic kidney disease, stage 2 (mild): Secondary | ICD-10-CM

## 2017-03-18 ENCOUNTER — Ambulatory Visit
Admission: RE | Admit: 2017-03-18 | Discharge: 2017-03-18 | Disposition: A | Discharge status: Home / Self Care | Payer: Medicare Other | Source: Ambulatory Visit | Attending: Nephrology | Admitting: Nephrology

## 2017-03-18 DIAGNOSIS — N182 Chronic kidney disease, stage 2 (mild): Secondary | ICD-10-CM

## 2017-12-03 DEATH — deceased

## 2018-10-19 ENCOUNTER — Emergency Department (HOSPITAL_COMMUNITY): Payer: Medicare Other

## 2018-10-19 ENCOUNTER — Inpatient Hospital Stay (HOSPITAL_COMMUNITY)
Admission: EM | Admit: 2018-10-19 | Discharge: 2018-10-24 | DRG: 637 | Disposition: A | Discharge status: Home Health | Payer: Medicare Other | Attending: Internal Medicine | Admitting: Internal Medicine

## 2018-10-19 ENCOUNTER — Observation Stay (HOSPITAL_COMMUNITY): Payer: Medicare Other

## 2018-10-19 ENCOUNTER — Encounter (HOSPITAL_COMMUNITY): Payer: Self-pay

## 2018-10-19 ENCOUNTER — Other Ambulatory Visit: Payer: Self-pay

## 2018-10-19 DIAGNOSIS — E1165 Type 2 diabetes mellitus with hyperglycemia: Principal | ICD-10-CM | POA: Diagnosis present

## 2018-10-19 DIAGNOSIS — E860 Dehydration: Secondary | ICD-10-CM | POA: Diagnosis present

## 2018-10-19 DIAGNOSIS — F17210 Nicotine dependence, cigarettes, uncomplicated: Secondary | ICD-10-CM | POA: Diagnosis present

## 2018-10-19 DIAGNOSIS — E876 Hypokalemia: Secondary | ICD-10-CM | POA: Diagnosis present

## 2018-10-19 DIAGNOSIS — Z825 Family history of asthma and other chronic lower respiratory diseases: Secondary | ICD-10-CM

## 2018-10-19 DIAGNOSIS — Z803 Family history of malignant neoplasm of breast: Secondary | ICD-10-CM

## 2018-10-19 DIAGNOSIS — C73 Malignant neoplasm of thyroid gland: Secondary | ICD-10-CM | POA: Diagnosis present

## 2018-10-19 DIAGNOSIS — Z8661 Personal history of infections of the central nervous system: Secondary | ICD-10-CM

## 2018-10-19 DIAGNOSIS — Z1159 Encounter for screening for other viral diseases: Secondary | ICD-10-CM

## 2018-10-19 DIAGNOSIS — I160 Hypertensive urgency: Secondary | ICD-10-CM | POA: Diagnosis present

## 2018-10-19 DIAGNOSIS — K644 Residual hemorrhoidal skin tags: Secondary | ICD-10-CM | POA: Diagnosis present

## 2018-10-19 DIAGNOSIS — I129 Hypertensive chronic kidney disease with stage 1 through stage 4 chronic kidney disease, or unspecified chronic kidney disease: Secondary | ICD-10-CM | POA: Diagnosis present

## 2018-10-19 DIAGNOSIS — Z86010 Personal history of colonic polyps: Secondary | ICD-10-CM

## 2018-10-19 DIAGNOSIS — E039 Hypothyroidism, unspecified: Secondary | ICD-10-CM | POA: Diagnosis present

## 2018-10-19 DIAGNOSIS — K8020 Calculus of gallbladder without cholecystitis without obstruction: Secondary | ICD-10-CM | POA: Diagnosis present

## 2018-10-19 DIAGNOSIS — R739 Hyperglycemia, unspecified: Secondary | ICD-10-CM

## 2018-10-19 DIAGNOSIS — Z7982 Long term (current) use of aspirin: Secondary | ICD-10-CM

## 2018-10-19 DIAGNOSIS — G92 Toxic encephalopathy: Secondary | ICD-10-CM | POA: Diagnosis present

## 2018-10-19 DIAGNOSIS — Z7984 Long term (current) use of oral hypoglycemic drugs: Secondary | ICD-10-CM

## 2018-10-19 DIAGNOSIS — R410 Disorientation, unspecified: Secondary | ICD-10-CM

## 2018-10-19 DIAGNOSIS — H539 Unspecified visual disturbance: Secondary | ICD-10-CM | POA: Diagnosis present

## 2018-10-19 DIAGNOSIS — Z79899 Other long term (current) drug therapy: Secondary | ICD-10-CM

## 2018-10-19 DIAGNOSIS — Z85850 Personal history of malignant neoplasm of thyroid: Secondary | ICD-10-CM

## 2018-10-19 DIAGNOSIS — Z923 Personal history of irradiation: Secondary | ICD-10-CM

## 2018-10-19 DIAGNOSIS — G9340 Encephalopathy, unspecified: Secondary | ICD-10-CM | POA: Diagnosis present

## 2018-10-19 DIAGNOSIS — Z9114 Patient's other noncompliance with medication regimen: Secondary | ICD-10-CM

## 2018-10-19 DIAGNOSIS — J449 Chronic obstructive pulmonary disease, unspecified: Secondary | ICD-10-CM | POA: Diagnosis present

## 2018-10-19 DIAGNOSIS — E1122 Type 2 diabetes mellitus with diabetic chronic kidney disease: Secondary | ICD-10-CM | POA: Diagnosis present

## 2018-10-19 DIAGNOSIS — Z8249 Family history of ischemic heart disease and other diseases of the circulatory system: Secondary | ICD-10-CM

## 2018-10-19 DIAGNOSIS — K760 Fatty (change of) liver, not elsewhere classified: Secondary | ICD-10-CM | POA: Diagnosis present

## 2018-10-19 DIAGNOSIS — N182 Chronic kidney disease, stage 2 (mild): Secondary | ICD-10-CM | POA: Diagnosis present

## 2018-10-19 DIAGNOSIS — B349 Viral infection, unspecified: Secondary | ICD-10-CM | POA: Diagnosis present

## 2018-10-19 DIAGNOSIS — Z79890 Hormone replacement therapy (postmenopausal): Secondary | ICD-10-CM

## 2018-10-19 DIAGNOSIS — Z823 Family history of stroke: Secondary | ICD-10-CM

## 2018-10-19 HISTORY — DX: Type 2 diabetes mellitus without complications: E11.9

## 2018-10-19 LAB — BASIC METABOLIC PANEL
Anion gap: 13 (ref 5–15)
BUN: 12 mg/dL (ref 8–23)
CO2: 16 mmol/L — ABNORMAL LOW (ref 22–32)
Calcium: 7.9 mg/dL — ABNORMAL LOW (ref 8.9–10.3)
Chloride: 98 mmol/L (ref 98–111)
Creatinine, Ser: 1.26 mg/dL — ABNORMAL HIGH (ref 0.61–1.24)
GFR calc Af Amer: >60 mL/min (ref >60)
GFR calc non Af Amer: 57 mL/min — ABNORMAL LOW (ref >60)
Glucose, Bld: 443 mg/dL — ABNORMAL HIGH (ref 70–99)
Potassium: 5.2 mmol/L — ABNORMAL HIGH (ref 3.5–5.1)
Sodium: 127 mmol/L — ABNORMAL LOW (ref 135–145)

## 2018-10-19 LAB — CBG MONITORING, ED
Glucose-Capillary: >600 mg/dL (ref 70–99)
Glucose-Capillary: 529 mg/dL (ref 70–99)
Glucose-Capillary: 538 mg/dL (ref 70–99)
Glucose-Capillary: 495 mg/dL — ABNORMAL HIGH (ref 70–99)

## 2018-10-19 LAB — OSMOLALITY: Osmolality: 300 mOsm/kg — ABNORMAL HIGH (ref 275–295)

## 2018-10-19 LAB — COMPREHENSIVE METABOLIC PANEL
ALT: 32 U/L (ref 0–44)
AST: 21 U/L (ref 15–41)
Albumin: 3.2 g/dL — ABNORMAL LOW (ref 3.5–5.0)
Alkaline Phosphatase: 143 U/L — ABNORMAL HIGH (ref 38–126)
Anion gap: 13 (ref 5–15)
BUN: 8 mg/dL (ref 8–23)
CO2: 20 mmol/L — ABNORMAL LOW (ref 22–32)
Calcium: 8.5 mg/dL — ABNORMAL LOW (ref 8.9–10.3)
Chloride: 97 mmol/L — ABNORMAL LOW (ref 98–111)
Creatinine, Ser: 1.16 mg/dL (ref 0.61–1.24)
GFR calc Af Amer: >60 mL/min (ref >60)
GFR calc non Af Amer: >60 mL/min (ref >60)
Glucose, Bld: 607 mg/dL (ref 70–99)
Potassium: 4.1 mmol/L (ref 3.5–5.1)
Sodium: 130 mmol/L — ABNORMAL LOW (ref 135–145)
Total Bilirubin: 0.7 mg/dL (ref 0.3–1.2)
Total Protein: 7 g/dL (ref 6.5–8.1)

## 2018-10-19 LAB — CBC WITH DIFFERENTIAL/PLATELET
Abs Immature Granulocytes: 0.05 10*3/uL (ref 0.00–0.07)
Basophils Absolute: 0.1 10*3/uL (ref 0.0–0.1)
Basophils Relative: 1 %
Eosinophils Absolute: 0.1 10*3/uL (ref 0.0–0.5)
Eosinophils Relative: 1 %
HCT: 47.9 % (ref 39.0–52.0)
Hemoglobin: 16.7 g/dL (ref 13.0–17.0)
Immature Granulocytes: 1 %
Lymphocytes Relative: 25 %
Lymphs Abs: 2.5 10*3/uL (ref 0.7–4.0)
MCH: 31.6 pg (ref 26.0–34.0)
MCHC: 34.9 g/dL (ref 30.0–36.0)
MCV: 90.5 fL (ref 80.0–100.0)
Monocytes Absolute: 0.6 10*3/uL (ref 0.1–1.0)
Monocytes Relative: 6 %
Neutro Abs: 6.8 10*3/uL (ref 1.7–7.7)
Neutrophils Relative %: 66 %
Platelets: 379 10*3/uL (ref 150–400)
RBC: 5.29 MIL/uL (ref 4.22–5.81)
RDW: 13.9 % (ref 11.5–15.5)
WBC: 10.2 10*3/uL (ref 4.0–10.5)
nRBC: 0 % (ref 0.0–0.2)

## 2018-10-19 LAB — POCT I-STAT EG7
Acid-Base Excess: 1 mmol/L (ref 0.0–2.0)
Bicarbonate: 23.4 mmol/L (ref 20.0–28.0)
Calcium, Ion: 0.97 mmol/L — ABNORMAL LOW (ref 1.15–1.40)
HCT: 48 % (ref 39.0–52.0)
Hemoglobin: 16.3 g/dL (ref 13.0–17.0)
O2 Saturation: 100 %
Potassium: 4 mmol/L (ref 3.5–5.1)
Sodium: 129 mmol/L — ABNORMAL LOW (ref 135–145)
TCO2: 24 mmol/L (ref 22–32)
pCO2, Ven: 31.7 mmHg — ABNORMAL LOW (ref 44.0–60.0)
pH, Ven: 7.477 — ABNORMAL HIGH (ref 7.250–7.430)
pO2, Ven: 164 mmHg — ABNORMAL HIGH (ref 32.0–45.0)

## 2018-10-19 LAB — URINALYSIS, ROUTINE W REFLEX MICROSCOPIC
Bacteria, UA: NONE SEEN
Bilirubin Urine: NEGATIVE
Glucose, UA: >=500 mg/dL — AB
Ketones, ur: 20 mg/dL — AB
Leukocytes,Ua: NEGATIVE
Nitrite: NEGATIVE
Protein, ur: >=300 mg/dL — AB
Specific Gravity, Urine: 1.032 — ABNORMAL HIGH (ref 1.005–1.030)
pH: 6 (ref 5.0–8.0)

## 2018-10-19 LAB — RAPID URINE DRUG SCREEN, HOSP PERFORMED
Amphetamines: NOT DETECTED
Barbiturates: NOT DETECTED
Benzodiazepines: NOT DETECTED
Cocaine: NOT DETECTED
Opiates: NOT DETECTED
Tetrahydrocannabinol: NOT DETECTED

## 2018-10-19 LAB — TSH: TSH: 18.259 u[IU]/mL — ABNORMAL HIGH (ref 0.350–4.500)

## 2018-10-19 LAB — T4, FREE: Free T4: 0.6 ng/dL — ABNORMAL LOW (ref 0.61–1.12)

## 2018-10-19 LAB — GLUCOSE, CAPILLARY
Glucose-Capillary: 461 mg/dL — ABNORMAL HIGH (ref 70–99)
Glucose-Capillary: 373 mg/dL — ABNORMAL HIGH (ref 70–99)

## 2018-10-19 LAB — MAGNESIUM: Magnesium: 1.8 mg/dL (ref 1.7–2.4)

## 2018-10-19 LAB — PHOSPHORUS: Phosphorus: 2.6 mg/dL (ref 2.5–4.6)

## 2018-10-19 LAB — ETHANOL: Alcohol, Ethyl (B): <10 mg/dL (ref <10)

## 2018-10-19 IMAGING — CT CT HEAD WITHOUT CONTRAST
4 series · 16 of 47 positions shown, 18 images · non-contrast
Comparison: None.

CLINICAL DATA: Altered mental status

EXAM:
CT HEAD WITHOUT CONTRAST
TECHNIQUE: Contiguous axial images were obtained from the base of the skull
through the vertex without intravenous contrast.

[Series 3: head without · axial · non-contrast · 0.48mm/px · z∈[-64,+72]mm · 7 of 37 slices shown, 9 images]
[im 5/37  brain]
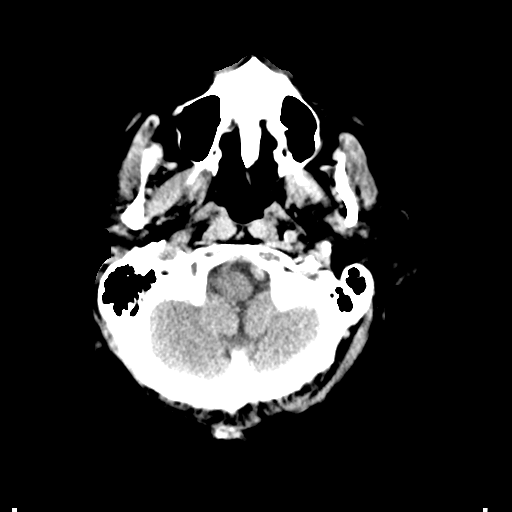
[im 5/37  bone]
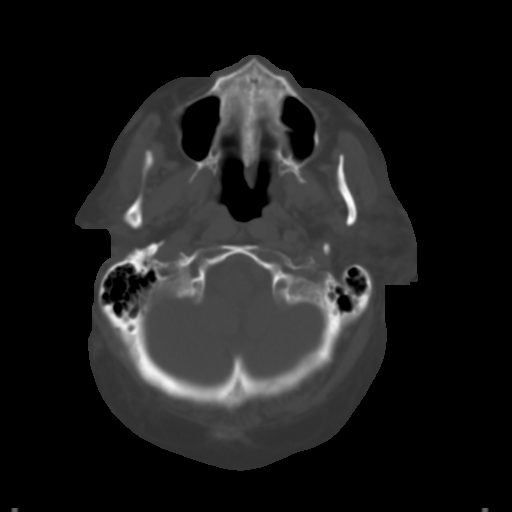
[im 10/37  brain]
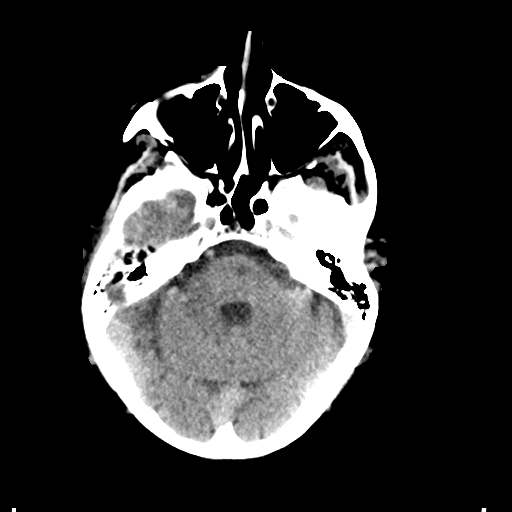
[im 14/37  brain]
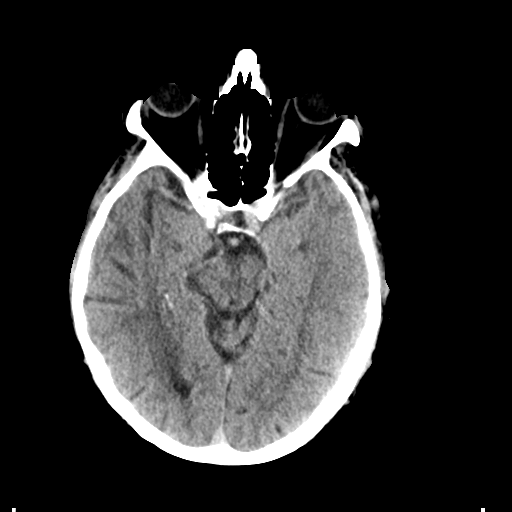
[im 19/37  brain]
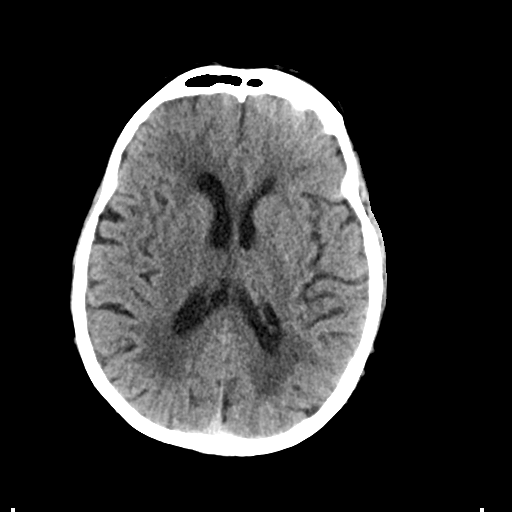
[im 23/37  brain]
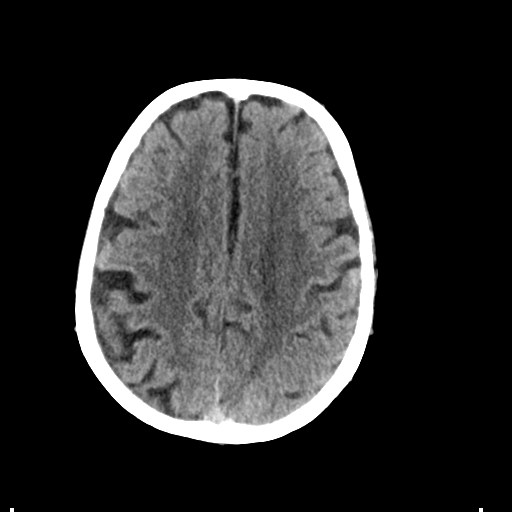
[im 23/37  bone]
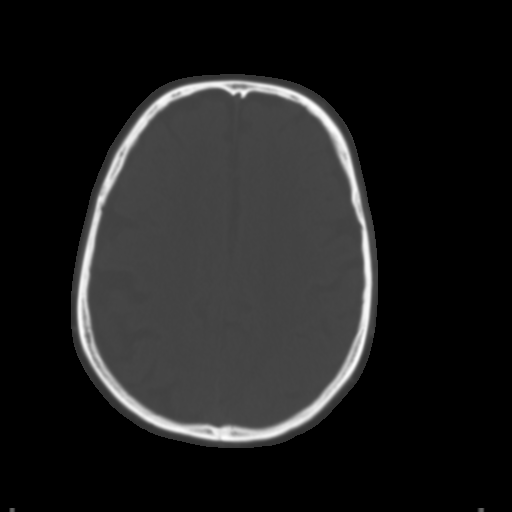
[im 28/37  brain]
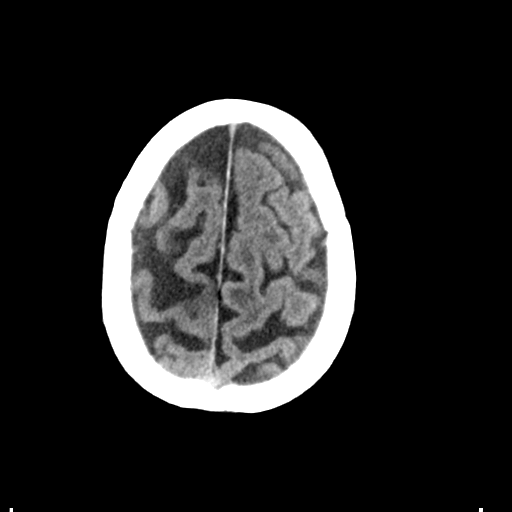
[im 32/37  brain]
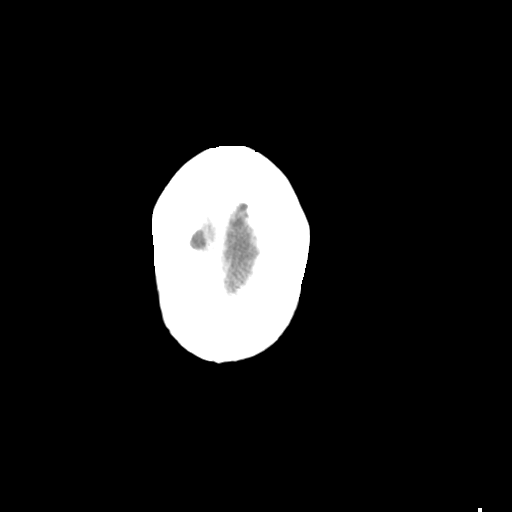

[Series 4: head bone · axial · 0.48mm/px · z∈[-66,-30]mm · 3 of 91 slices shown]
[im 10/91  bone]
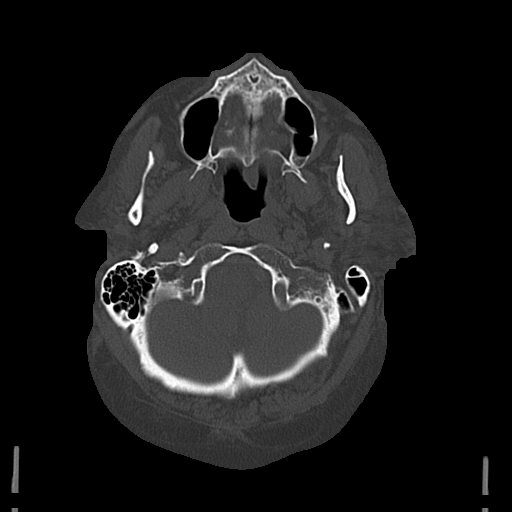
[im 19/91  bone]
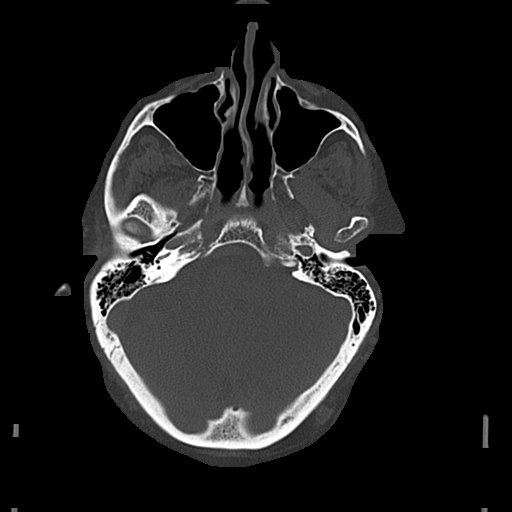
[im 28/91  bone]
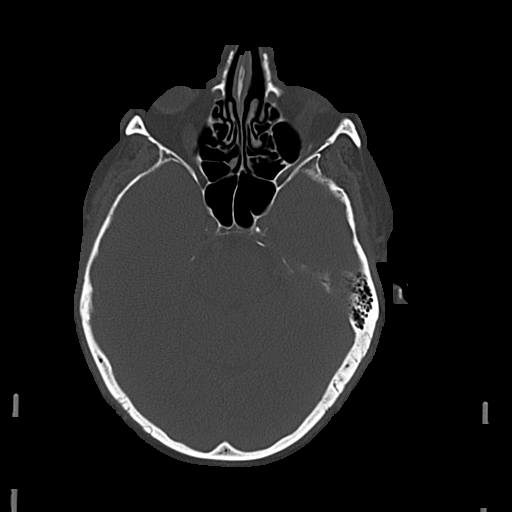

[Series 5: head without cor · coronal · non-contrast · 0.35mm/px · 3 of 70 slices shown]
[im 24/70  brain]
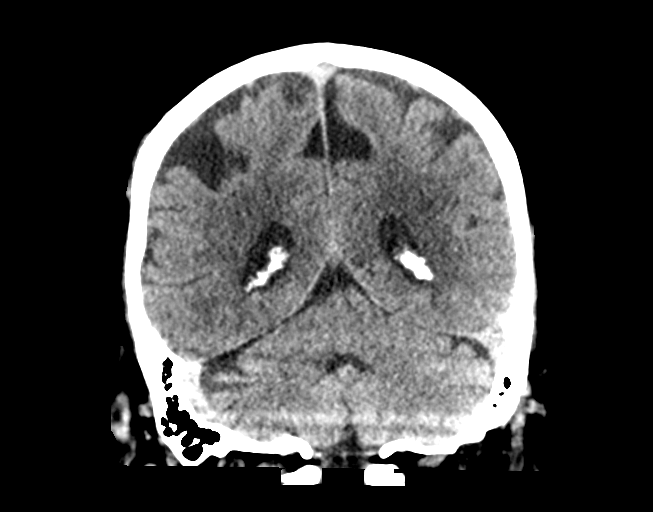
[im 31/70  brain]
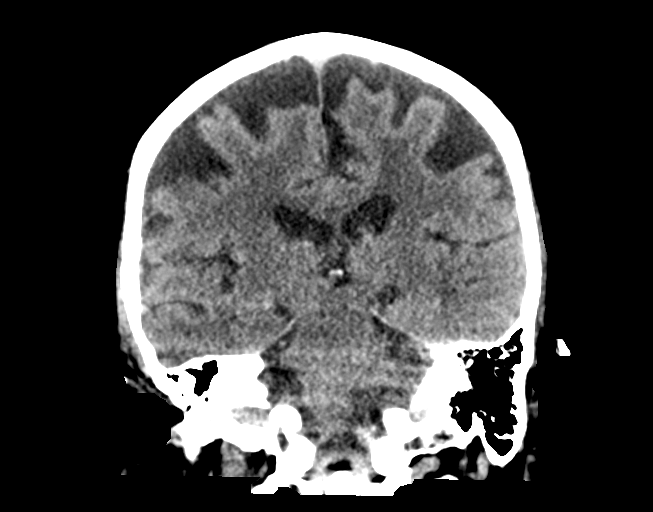
[im 39/70  brain]
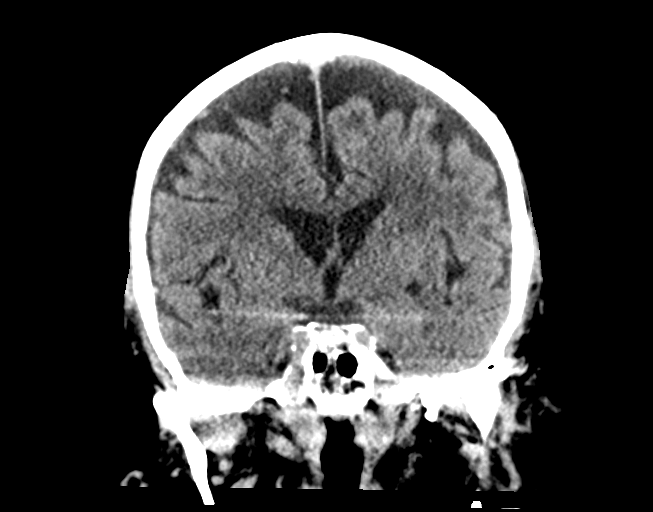

[Series 6: head without sag · sagittal · non-contrast · 0.34mm/px · 3 of 67 slices shown]
[im 23/67  brain]
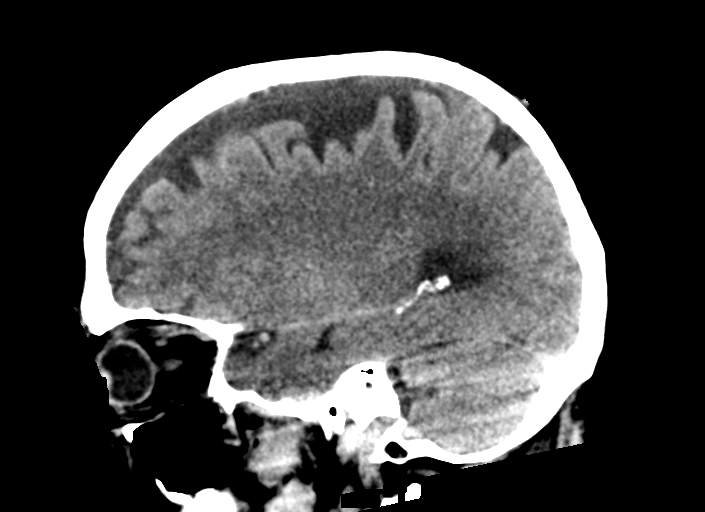
[im 34/67  brain]
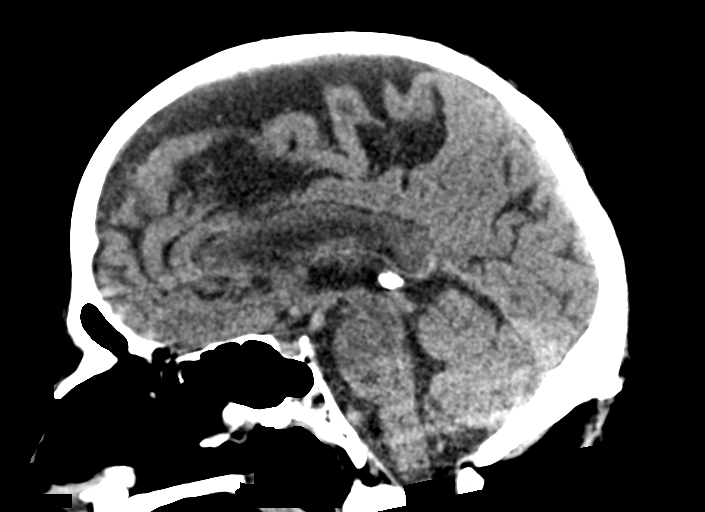
[im 45/67  brain]
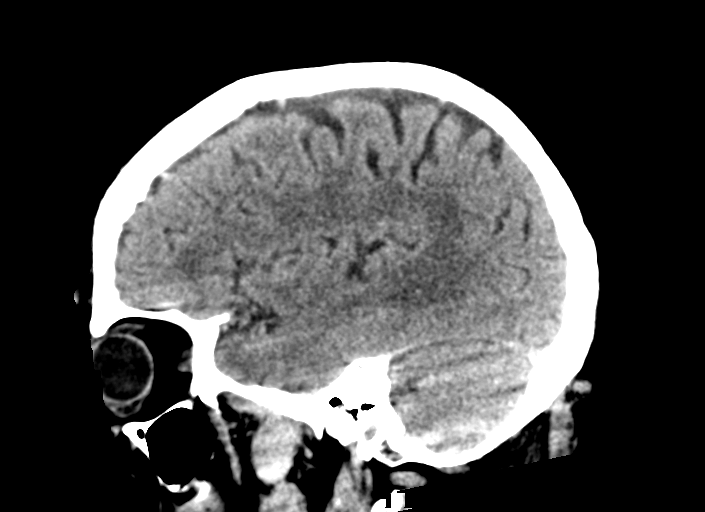

[16 of 47 positions shown; findings below may reference images not displayed]

FINDINGS: Brain: No evidence of acute infarction, hemorrhage, extra-axial
collection, ventriculomegaly, or mass effect. Generalized cerebral
atrophy. Periventricular white matter low attenuation likely
secondary to microangiopathy.

Vascular: Cerebrovascular atherosclerotic calcifications are noted.

Skull: Negative for fracture or focal lesion.

Sinuses/Orbits: Visualized portions of the orbits are unremarkable.
Visualized portions of the paranasal sinuses and mastoid air cells
are unremarkable.

Other: None.
IMPRESSION: No acute intracranial pathology.

## 2018-10-19 IMAGING — CT CT ABDOMEN AND PELVIS WITH CONTRAST
2 of 5 series · 16 of 46 positions shown, 18 images · IV contrast (APPLIED)
Comparison: None.

CLINICAL DATA: Acute generalized abdominal pain.

EXAM:
CT ABDOMEN AND PELVIS WITH CONTRAST
TECHNIQUE: Multidetector CT imaging of the abdomen and pelvis was performed
using the standard protocol following bolus administration of
intravenous contrast.
CONTRAST:  100mL OMNIPAQUE IOHEXOL 300 MG/ML  SOLN

[Series 3: abdomen 5.0 · axial · 0.96mm/px · z∈[+824,+1279]mm · 13 of 105 slices shown, 15 images]
[im 7/105  soft-tissue]
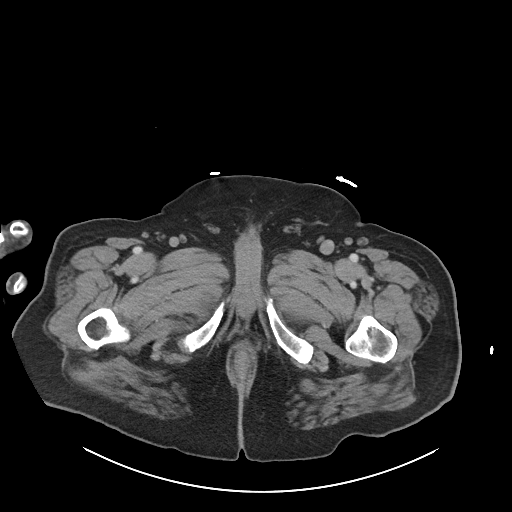
[im 7/105  bone]
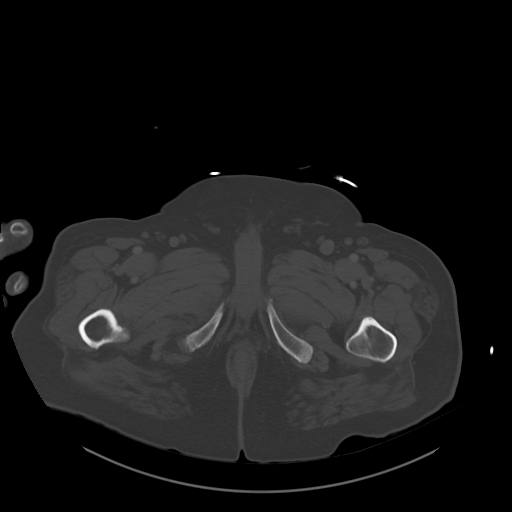
[im 14/105  soft-tissue]
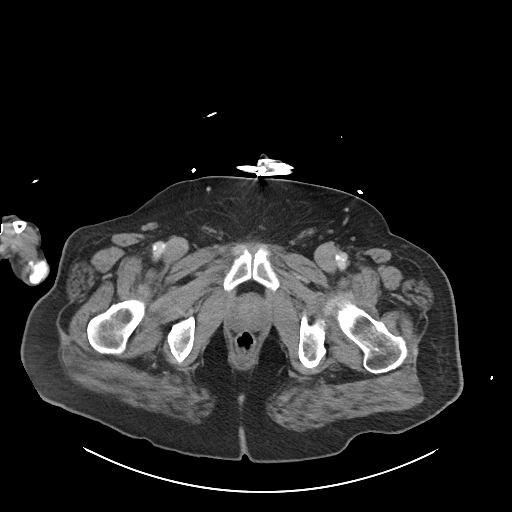
[im 20/105  soft-tissue]
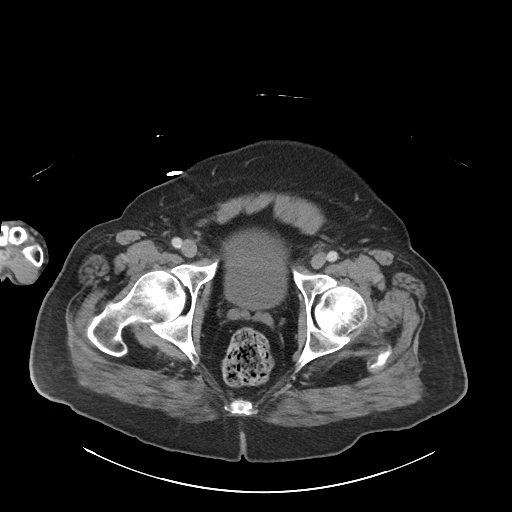
[im 33/105  soft-tissue]
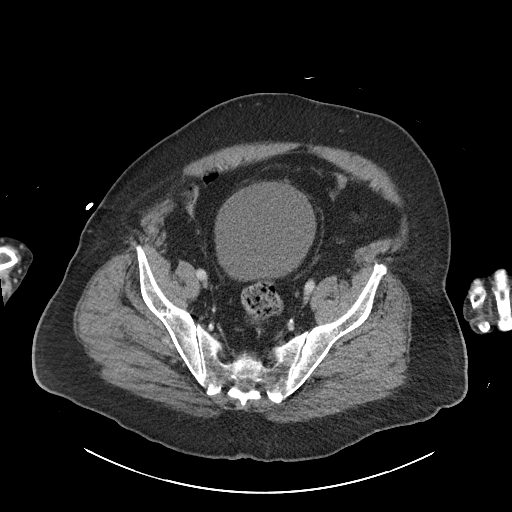
[im 40/105  soft-tissue]
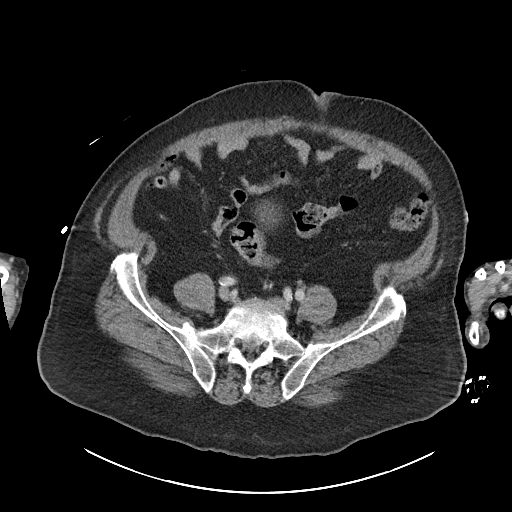
[im 46/105  soft-tissue]
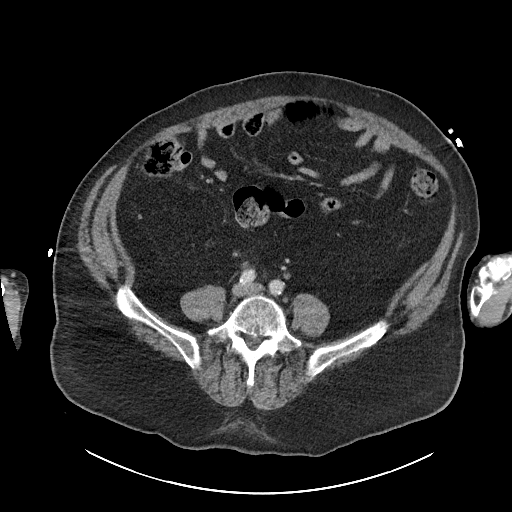
[im 53/105  soft-tissue]
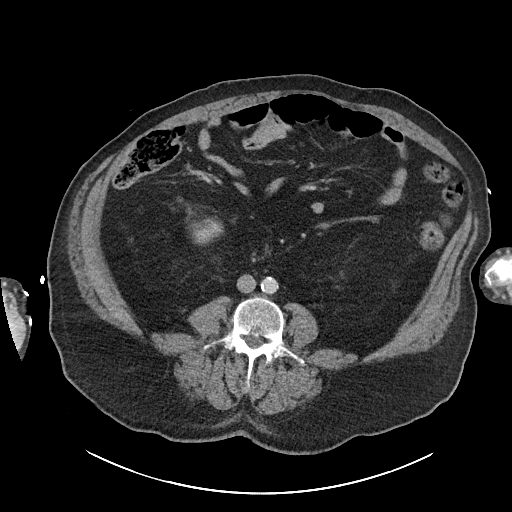
[im 59/105  soft-tissue]
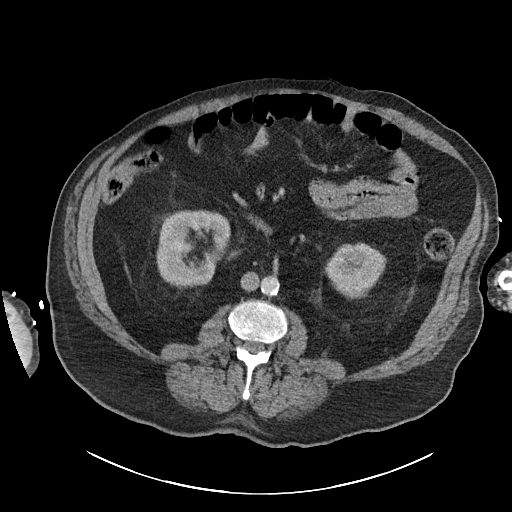
[im 66/105  soft-tissue]
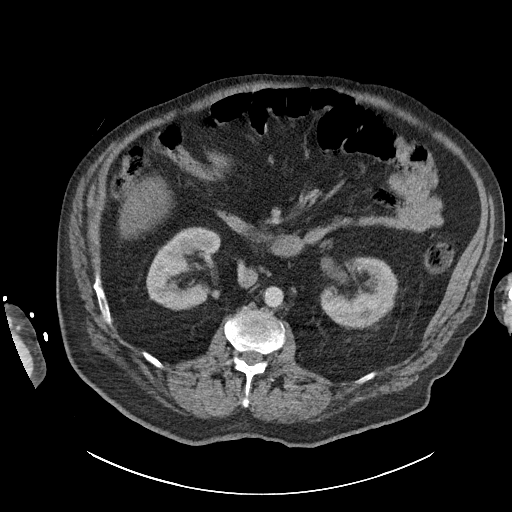
[im 66/105  bone]
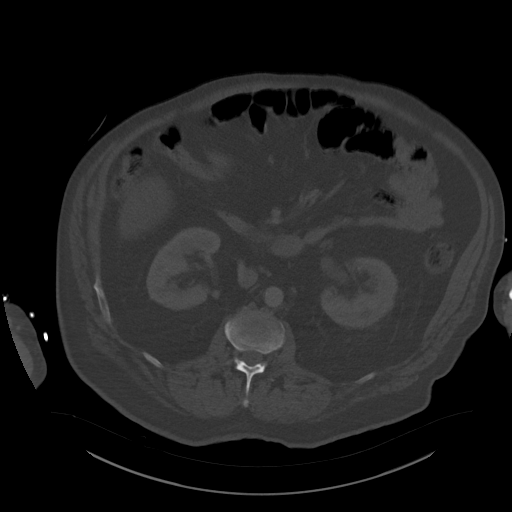
[im 72/105  soft-tissue]
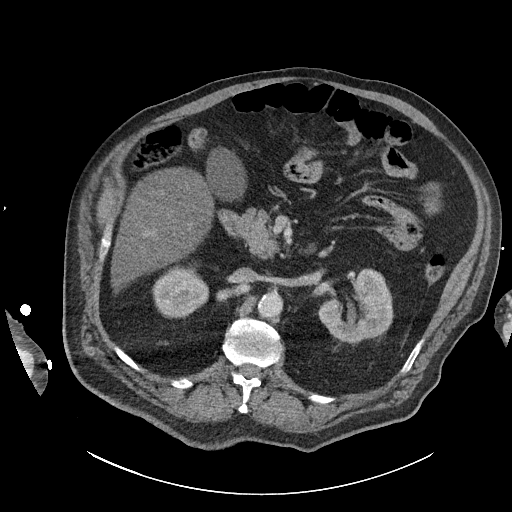
[im 85/105  soft-tissue]
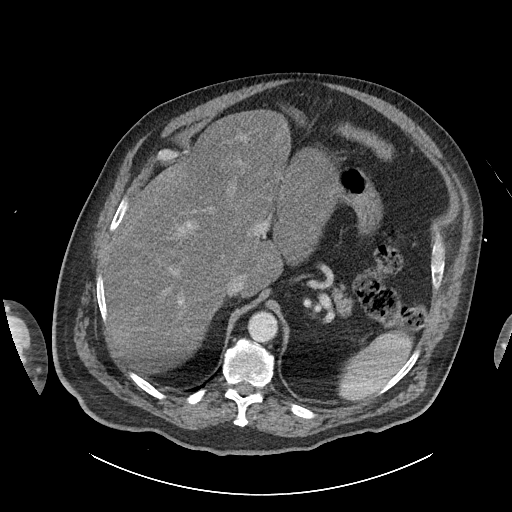
[im 92/105  soft-tissue]
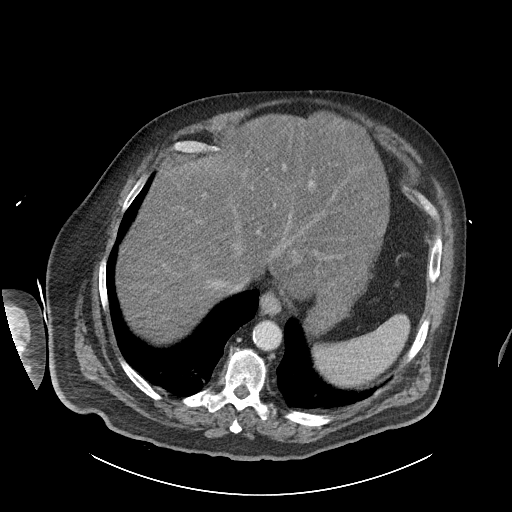
[im 98/105  soft-tissue]
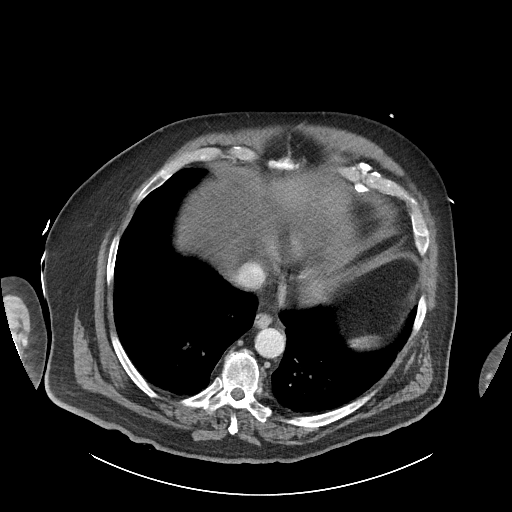

[Series 6: abdomen 3.0 mpr cor · coronal · 0.98mm/px · 3 of 139 slices shown]
[im 47/139  soft-tissue]
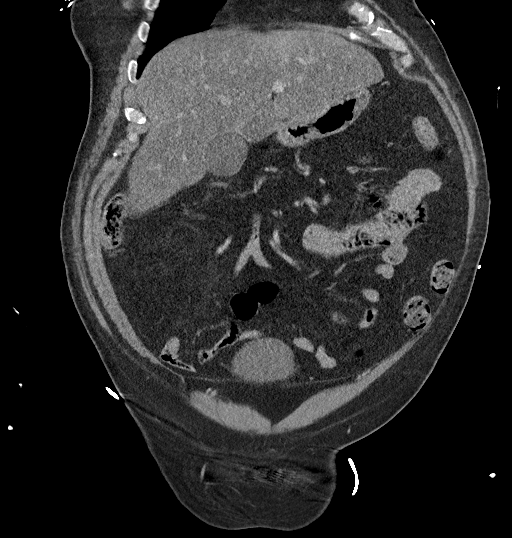
[im 62/139  soft-tissue]
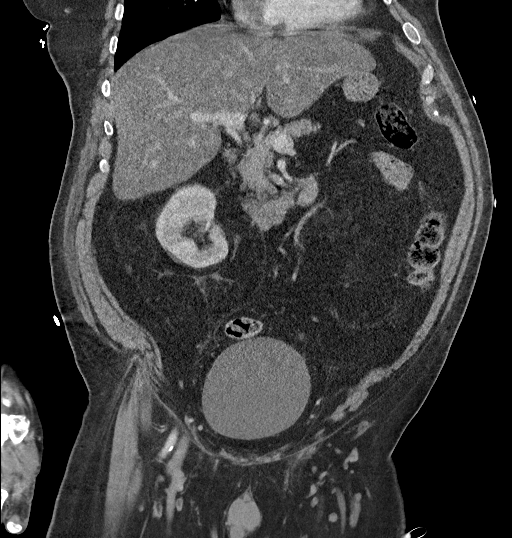
[im 77/139  soft-tissue]
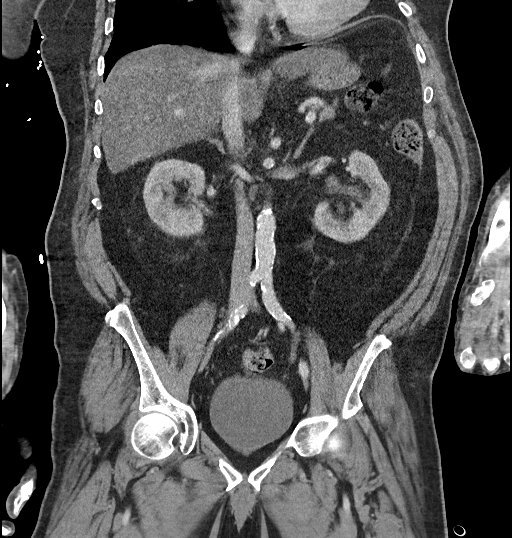

[16 of 46 positions shown; findings below may reference images not displayed]

FINDINGS: Lower chest: The lung bases are poorly evaluated secondary to motion
artifact and streak artifact from the patient's arms. The heart
appears borderline enlarged. The lungs are mostly clear aside from a
minimal amount of atelectasis at the lung bases.

Hepatobiliary: There is severe hepatic steatosis. Again noted is a
large gallstone at the gallbladder neck. There is no CT evidence of
acute cholecystitis.

Pancreas: Unremarkable. No pancreatic ductal dilatation or
surrounding inflammatory changes.

Spleen: Normal in size without focal abnormality.

Adrenals/Urinary Tract: Adrenal glands are unremarkable. Kidneys are
normal, without renal calculi, focal lesion, or hydronephrosis. The
bladder is moderately distended.

Stomach/Bowel: There is a moderate amount of stool in the colon. The
stomach is unremarkable. The appendix is unremarkable. There is no
evidence of a small-bowel obstruction.

Vascular/Lymphatic: Aortic atherosclerosis. No enlarged abdominal or
pelvic lymph nodes.

Reproductive: Prostate is unremarkable.

Other: There is a small fat containing periumbilical hernia. There
is no significant free fluid within the abdomen or pelvis.

Musculoskeletal: No acute or significant osseous findings.
IMPRESSION: 1. No acute intra-abdominal abnormality detected.
2. Severe hepatic steatosis.
3. Large gallstone is again noted at the gallbladder neck. There is
no CT evidence of acute cholecystitis.
4. Moderately distended urinary bladder.

## 2018-10-19 IMAGING — DX PORTABLE CHEST - 1 VIEW
1 series · 1 of 1 positions shown · non-contrast
Comparison: None.

CLINICAL DATA: Altered mental status

EXAM:
PORTABLE CHEST 1 VIEW

[chest ap]
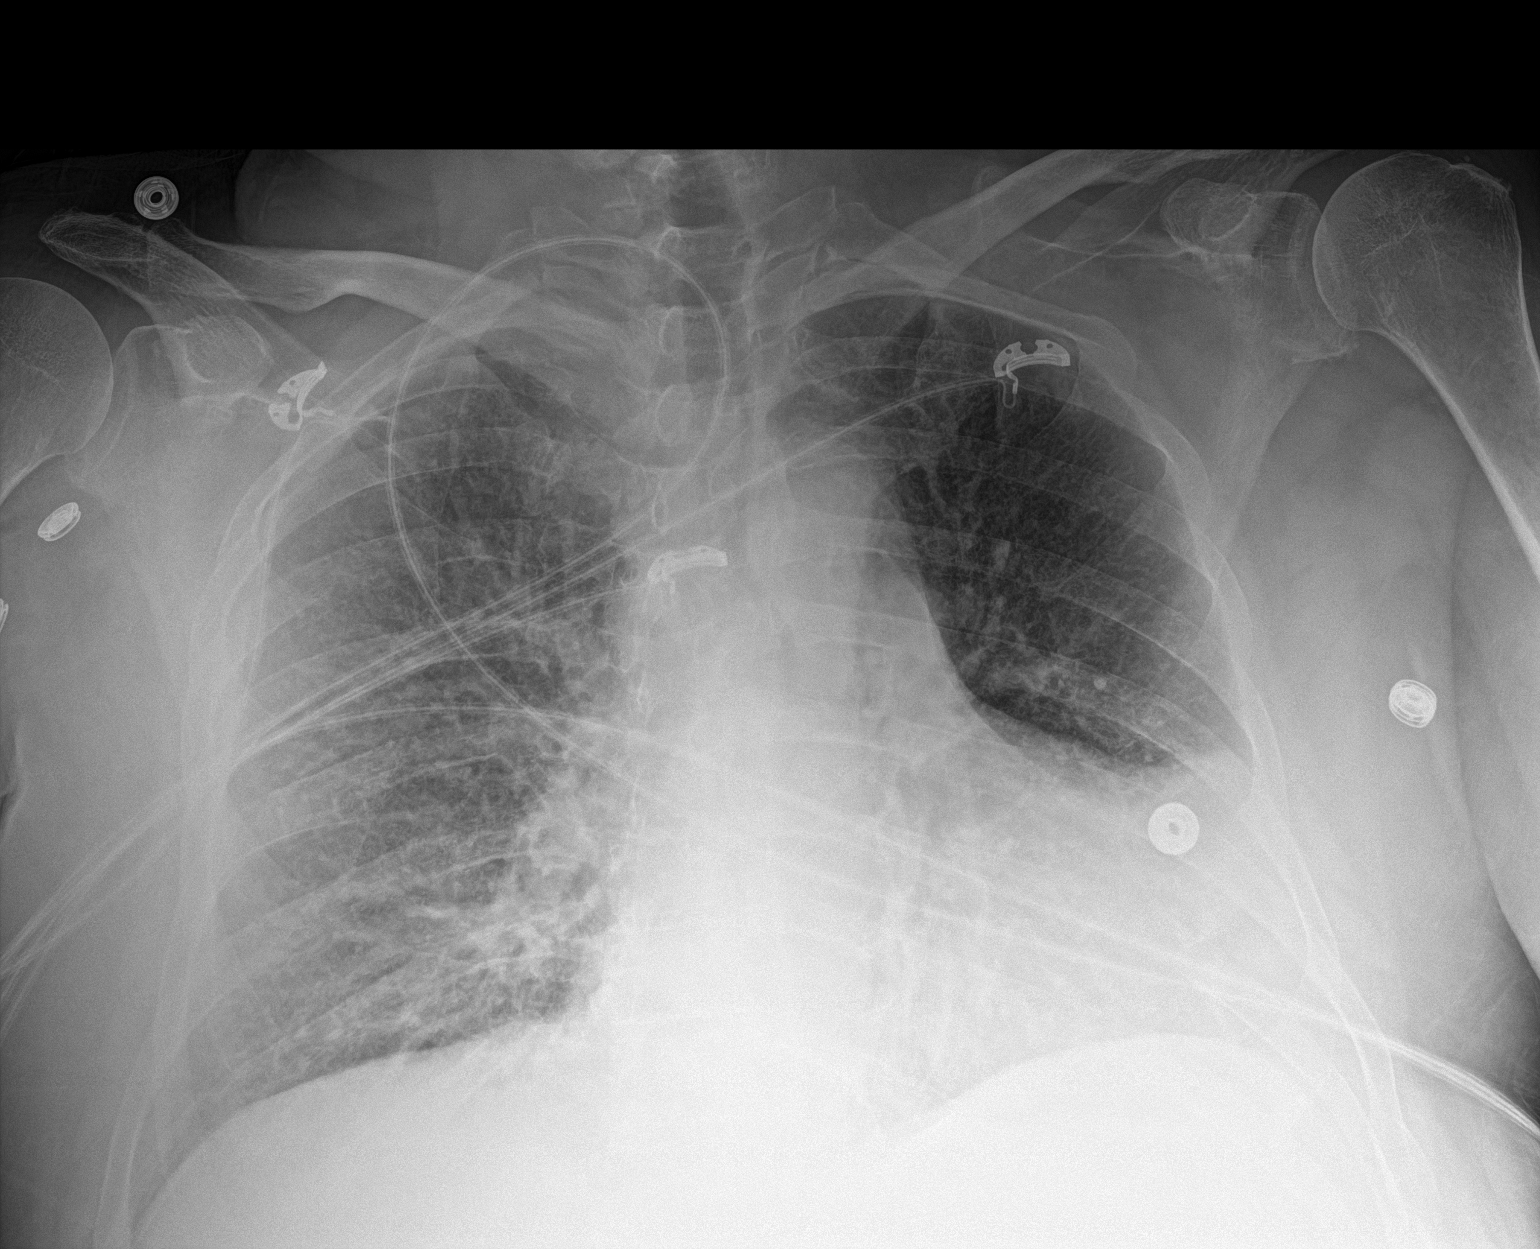

[1 of 1 positions shown; findings below may reference images not displayed]

FINDINGS: The heart size is enlarged. There appear to be emphysematous changes
bilaterally. There is no pneumothorax. The patient is rotated which
limits evaluation. No large area of consolidation. No pneumothorax.
No acute osseous abnormality. There is no unexpected metallic
radiopaque foreign body.
IMPRESSION: 1. No unexpected radiopaque metallic foreign body.
2. Cardiomegaly.
3. Emphysematous changes bilaterally. No acute cardiopulmonary
process identified on today's exam.

## 2018-10-19 MED ORDER — POTASSIUM CHLORIDE 10 MEQ/100ML IV SOLN
10.0000 meq | INTRAVENOUS | Status: AC
  Filled 2018-10-19: qty 100

## 2018-10-19 MED ORDER — LORAZEPAM 2 MG/ML IJ SOLN
INTRAMUSCULAR | Status: AC
  Administered 2018-10-19: 16:00:00 1 mg via INTRAVENOUS
  Filled 2018-10-19: qty 1

## 2018-10-19 MED ORDER — ACETAMINOPHEN 325 MG PO TABS
650.0000 mg | ORAL_TABLET | Freq: Four times a day (QID) | ORAL | Status: DC | PRN
  Administered 2018-10-21 – 2018-10-24 (×3): 650 mg via ORAL
  Filled 2018-10-19 (×3): qty 2

## 2018-10-19 MED ORDER — PROMETHAZINE HCL 25 MG PO TABS: 12.5000 mg | ORAL_TABLET | Freq: Four times a day (QID) | ORAL | Status: DC | PRN

## 2018-10-19 MED ORDER — SODIUM CHLORIDE 0.9 % IV BOLUS
1000.0000 mL | Freq: Once | INTRAVENOUS | Status: AC
  Administered 2018-10-19: 1000 mL via INTRAVENOUS

## 2018-10-19 MED ORDER — LABETALOL HCL 5 MG/ML IV SOLN
5.0000 mg | INTRAVENOUS | Status: DC | PRN
  Administered 2018-10-19: 5 mg via INTRAVENOUS
  Filled 2018-10-19: qty 4

## 2018-10-19 MED ORDER — DEXTROSE-NACL 5-0.45 % IV SOLN: INTRAVENOUS | Status: DC

## 2018-10-19 MED ORDER — HALOPERIDOL LACTATE 5 MG/ML IJ SOLN
2.0000 mg | Freq: Once | INTRAMUSCULAR | Status: AC
  Administered 2018-10-19: 2 mg via INTRAVENOUS
  Filled 2018-10-19: qty 1

## 2018-10-19 MED ORDER — IPRATROPIUM-ALBUTEROL 0.5-2.5 (3) MG/3ML IN SOLN
3.0000 mL | Freq: Three times a day (TID) | RESPIRATORY_TRACT | Status: DC
  Administered 2018-10-19 – 2018-10-20 (×2): 3 mL via RESPIRATORY_TRACT
  Filled 2018-10-19: qty 3

## 2018-10-19 MED ORDER — ACETAMINOPHEN 650 MG RE SUPP
650.0000 mg | Freq: Four times a day (QID) | RECTAL | Status: DC | PRN
  Administered 2018-10-19 – 2018-10-20 (×2): 650 mg via RECTAL
  Filled 2018-10-19 (×2): qty 1

## 2018-10-19 MED ORDER — LORAZEPAM 2 MG/ML IJ SOLN
1.0000 mg | Freq: Once | INTRAMUSCULAR | Status: AC
  Administered 2018-10-19: 1 mg via INTRAVENOUS

## 2018-10-19 MED ORDER — LORAZEPAM 2 MG/ML IJ SOLN
1.0000 mg | Freq: Once | INTRAMUSCULAR | Status: AC
  Administered 2018-10-19: 1 mg via INTRAVENOUS
  Filled 2018-10-19: qty 1

## 2018-10-19 MED ORDER — VANCOMYCIN HCL 10 G IV SOLR
2000.0000 mg | Freq: Once | INTRAVENOUS | Status: AC
  Administered 2018-10-20: 2000 mg via INTRAVENOUS
  Filled 2018-10-19: qty 2000

## 2018-10-19 MED ORDER — ENOXAPARIN SODIUM 40 MG/0.4ML ~~LOC~~ SOLN
40.0000 mg | SUBCUTANEOUS | Status: DC
  Administered 2018-10-19: 40 mg via SUBCUTANEOUS
  Filled 2018-10-19: qty 0.4

## 2018-10-19 MED ORDER — DEXAMETHASONE SODIUM PHOSPHATE 10 MG/ML IJ SOLN
10.0000 mg | Freq: Four times a day (QID) | INTRAMUSCULAR | Status: AC
  Administered 2018-10-20 (×2): 10 mg via INTRAVENOUS
  Filled 2018-10-19 (×2): qty 1

## 2018-10-19 MED ORDER — IPRATROPIUM-ALBUTEROL 0.5-2.5 (3) MG/3ML IN SOLN
3.0000 mL | Freq: Four times a day (QID) | RESPIRATORY_TRACT | Status: DC | PRN
  Administered 2018-10-21: 3 mL via RESPIRATORY_TRACT
  Filled 2018-10-19 (×2): qty 3

## 2018-10-19 MED ORDER — INSULIN REGULAR(HUMAN) IN NACL 100-0.9 UT/100ML-% IV SOLN
INTRAVENOUS | Status: DC
  Administered 2018-10-19: 4.4 [IU]/h via INTRAVENOUS
  Administered 2018-10-20: 9.4 [IU]/h via INTRAVENOUS
  Filled 2018-10-19 (×2): qty 100

## 2018-10-19 MED ORDER — CALCIUM GLUCONATE-NACL 1-0.675 GM/50ML-% IV SOLN
1.0000 g | Freq: Once | INTRAVENOUS | Status: AC
  Administered 2018-10-19: 1000 mg via INTRAVENOUS
  Filled 2018-10-19: qty 50

## 2018-10-19 MED ORDER — SODIUM CHLORIDE 0.9 % IV SOLN
INTRAVENOUS | Status: DC
  Administered 2018-10-19: 20:00:00 via INTRAVENOUS

## 2018-10-19 MED ORDER — INSULIN ASPART 100 UNIT/ML ~~LOC~~ SOLN
8.0000 [IU] | Freq: Once | SUBCUTANEOUS | Status: AC
  Administered 2018-10-19: 8 [IU] via INTRAVENOUS

## 2018-10-19 MED ORDER — THIAMINE HCL 100 MG/ML IJ SOLN
100.0000 mg | Freq: Every day | INTRAMUSCULAR | Status: DC
  Administered 2018-10-20 – 2018-10-22 (×3): 100 mg via INTRAVENOUS
  Filled 2018-10-19 (×3): qty 2

## 2018-10-19 MED ORDER — SODIUM CHLORIDE 0.9 % IV SOLN: INTRAVENOUS | Status: DC

## 2018-10-19 MED ORDER — INSULIN REGULAR BOLUS VIA INFUSION
0.0000 [IU] | Freq: Three times a day (TID) | INTRAVENOUS | Status: DC
  Filled 2018-10-19: qty 10

## 2018-10-19 MED ORDER — INSULIN REGULAR(HUMAN) IN NACL 100-0.9 UT/100ML-% IV SOLN: INTRAVENOUS | Status: DC

## 2018-10-19 MED ORDER — FOLIC ACID 5 MG/ML IJ SOLN
1.0000 mg | Freq: Every day | INTRAMUSCULAR | Status: DC
  Administered 2018-10-20 – 2018-10-22 (×3): 1 mg via INTRAVENOUS
  Filled 2018-10-19 (×5): qty 0.2

## 2018-10-19 MED ORDER — LORAZEPAM 2 MG/ML IJ SOLN
1.0000 mg | Freq: Once | INTRAMUSCULAR | Status: AC
  Administered 2018-10-19 – 2018-10-20 (×2): 1 mg via INTRAVENOUS
  Filled 2018-10-19: qty 1

## 2018-10-19 MED ORDER — POLYETHYLENE GLYCOL 3350 17 G PO PACK
17.0000 g | PACK | Freq: Every day | ORAL | Status: DC | PRN
  Administered 2018-10-22: 17 g via ORAL
  Filled 2018-10-19: qty 1

## 2018-10-19 MED ORDER — LEVOTHYROXINE SODIUM 100 MCG/5ML IV SOLN
100.0000 ug | Freq: Every day | INTRAVENOUS | Status: DC
  Administered 2018-10-20 – 2018-10-21 (×2): 100 ug via INTRAVENOUS
  Filled 2018-10-19 (×3): qty 5

## 2018-10-19 MED ORDER — DEXTROSE-NACL 5-0.45 % IV SOLN
INTRAVENOUS | Status: DC
  Administered 2018-10-20: 04:00:00 via INTRAVENOUS

## 2018-10-19 MED ORDER — SODIUM CHLORIDE 0.9 % IV SOLN
2.0000 g | Freq: Two times a day (BID) | INTRAVENOUS | Status: DC
  Administered 2018-10-20 (×3): 2 g via INTRAVENOUS
  Filled 2018-10-19 (×3): qty 20

## 2018-10-19 MED ORDER — SODIUM CHLORIDE 0.9 % IV SOLN
1.0000 g | Freq: Four times a day (QID) | INTRAVENOUS | Status: DC
  Administered 2018-10-20 – 2018-10-21 (×6): 1 g via INTRAVENOUS
  Filled 2018-10-19 (×8): qty 1000

## 2018-10-19 MED ORDER — IOHEXOL 300 MG/ML  SOLN
100.0000 mL | Freq: Once | INTRAMUSCULAR | Status: AC | PRN
  Administered 2018-10-19: 100 mL via INTRAVENOUS

## 2018-10-19 MED ORDER — DEXTROSE 50 % IV SOLN: 25.0000 mL | INTRAVENOUS | Status: DC | PRN

## 2018-10-19 NOTE — ED Triage Notes (Signed)
Pt from home AMS x2 hours. Family states pt reported migraine this morning and took on e amitriptylline, took a nap and woke up[ ams. blood sugar read high. Abdominal swelling new. No vomiting or nausea, afebrile.

## 2018-10-19 NOTE — ED Notes (Addendum)
ED TO INPATIENT HANDOFF REPORT  ED Nurse Name and Phone #: Lourena Simmonds [phone]  S Name/Age/Gender Matthew [name] 73 y.o. male Room/Bed: 041C/041C  Code Status   Code Status: Full Code  Home/SNF/Other Home Patient oriented to: self Is this baseline? No   Triage Complete: Triage complete  Chief Complaint AMS  Triage Note Pt from home AMS x2 hours. Family states pt reported migraine this morning and took on e amitriptylline, took a nap and woke up[ ams. blood sugar read high. Abdominal swelling new. No vomiting or nausea, afebrile.   Allergies No Known Allergies  Level of Care/Admitting Diagnosis ED Disposition    ED Disposition Condition [facility] Area: [place] [100100]  Level of Care: Progressive [102]  Covid Evaluation: Screening Protocol (No Symptoms)  Diagnosis: Encephalopathy acute [474259]  Admitting Physician: Aldine Contes [phone]  Attending Physician: Aldine Contes [phone]  PT Class (Do Not Modify): Observation [104]  PT Acc Code (Do Not Modify): Observation [10022]       B Medical/Surgery History Past Medical History:  Diagnosis Date  . Adenomatous polyps   . Benign neoplasm of colon   . Diabetes mellitus without complication ([place])   . External hemorrhoids   . Hemorrhoids   . Thyroid cancer ([place])    History reviewed. No pertinent surgical history.   A IV Location/Drains/Wounds Patient Lines/Drains/Airways Status   Active Line/Drains/Airways    Name:   Placement date:   Placement time:   Site:   Days:   Peripheral IV 10/19/18 Right Antecubital   10/19/18    1304    Antecubital   less than 1   External Urinary Catheter   10/19/18    1404    -   less than 1          Intake/Output Last 24 hours No intake or output data in the 24 hours ending 10/19/18 1924  Labs/Imaging Results for orders placed or performed during the hospital encounter of 10/19/18 (from the past 48 hour(s))  CBG monitoring, ED      Status: Abnormal   Collection Time: 10/19/18 12:42 PM  Result Value Ref Range   Glucose-Capillary >600 (HH) 70 - 99 mg/dL   Comment 1 Notify RN    Comment 2 Document in Chart   Ethanol     Status: None   Collection Time: 10/19/18  1:05 PM  Result Value Ref Range   Alcohol, Ethyl (B) <10 <10 mg/dL    Comment: (NOTE) Lowest detectable limit for serum alcohol is 10 mg/dL. For medical purposes only. Performed at [facility], [place] [address], [place], [place] [address]   Urinalysis, Routine w reflex microscopic     Status: Abnormal   Collection Time: 10/19/18  1:05 PM  Result Value Ref Range   Color, Urine STRAW (A) YELLOW   APPearance CLEAR CLEAR   Specific Gravity, Urine 1.032 (H) 1.005 - 1.030   pH 6.0 5.0 - 8.0   Glucose, UA >=500 (A) NEGATIVE mg/dL   Hgb urine dipstick SMALL (A) NEGATIVE   Bilirubin Urine NEGATIVE NEGATIVE   Ketones, ur 20 (A) NEGATIVE mg/dL   Protein, ur >=300 (A) NEGATIVE mg/dL   Nitrite NEGATIVE NEGATIVE   Leukocytes,Ua NEGATIVE NEGATIVE   RBC / HPF 0-5 0 - 5 RBC/hpf   WBC, UA 0-5 0 - 5 WBC/hpf   Bacteria, UA NONE SEEN NONE SEEN   Squamous Epithelial / LPF 0-5 0 - 5  Comment: Performed at [facility], [place] [address] [place], [place] [address]  Urine rapid drug screen (hosp performed)     Status: None   Collection Time: 10/19/18  1:05 PM  Result Value Ref Range   Opiates NONE DETECTED NONE DETECTED   Cocaine NONE DETECTED NONE DETECTED   Benzodiazepines NONE DETECTED NONE DETECTED   Amphetamines NONE DETECTED NONE DETECTED   Tetrahydrocannabinol NONE DETECTED NONE DETECTED   Barbiturates NONE DETECTED NONE DETECTED    Comment: (NOTE) DRUG SCREEN FOR MEDICAL PURPOSES ONLY.  IF CONFIRMATION IS NEEDED FOR ANY PURPOSE, NOTIFY LAB WITHIN 5 DAYS. LOWEST DETECTABLE LIMITS FOR URINE DRUG SCREEN Drug Class                     Cutoff (ng/mL) Amphetamine and metabolites    1000 Barbiturate and metabolites    200 Benzodiazepine                  263 Tricyclics and metabolites     300 Opiates and metabolites        300 Cocaine and metabolites        300 THC                            50 Performed at [facility], [place] [address] [place] [address] [place], [place] [address]   POCT I-Stat EG7     Status: Abnormal   Collection Time: 10/19/18  1:36 PM  Result Value Ref Range   pH, Ven 7.477 (H) 7.250 - 7.430   pCO2, Ven 31.7 (L) 44.0 - 60.0 mmHg   pO2, Ven 164.0 (H) 32.0 - 45.0 mmHg   Bicarbonate 23.4 20.0 - 28.0 mmol/L   TCO2 24 22 - 32 mmol/L   O2 Saturation 100.0 %   Acid-Base Excess 1.0 0.0 - 2.0 mmol/L   Sodium 129 (L) 135 - 145 mmol/L   Potassium 4.0 3.5 - 5.1 mmol/L   Calcium, Ion 0.97 (L) 1.15 - 1.40 mmol/L   HCT 48.0 39.0 - 52.0 %   Hemoglobin 16.3 13.0 - 17.0 g/dL   Patient temperature HIDE    Sample type VENOUS   Comprehensive metabolic panel     Status: Abnormal   Collection Time: 10/19/18  2:18 PM  Result Value Ref Range   Sodium 130 (L) 135 - 145 mmol/L   Potassium 4.1 3.5 - 5.1 mmol/L   Chloride 97 (L) 98 - 111 mmol/L   CO2 20 (L) 22 - 32 mmol/L   Glucose, Bld 607 (HH) 70 - 99 mg/dL    Comment: CRITICAL RESULT CALLED TO, READ BACK BY AND VERIFIED WITH: E Alphonsus Doyel RN [phone] BY A BENNETT    BUN 8 8 - 23 mg/dL   Creatinine, Ser 1.16 0.61 - 1.24 mg/dL   Calcium 8.5 (L) 8.9 - 10.3 mg/dL   Total Protein 7.0 6.5 - 8.1 g/dL   Albumin 3.2 (L) 3.5 - 5.0 g/dL   AST 21 15 - 41 U/L   ALT 32 0 - 44 U/L   Alkaline Phosphatase 143 (H) 38 - 126 U/L   Total Bilirubin 0.7 0.3 - 1.2 mg/dL   GFR calc non Af Amer >60 >60 mL/min   GFR calc Af Amer >60 >60 mL/min   Anion gap 13 5 - 15    Comment: Performed at [facility], [place] [address] [place],  [address]  CBC with Differential  Status: None   Collection Time: 10/19/18  2:18 PM  Result Value Ref Range   WBC 10.2 4.0 - 10.5 K/uL   RBC 5.29 4.22 - 5.81 MIL/uL   Hemoglobin 16.7 13.0 - 17.0 g/dL   HCT 47.9 39.0 - 52.0 %   MCV 90.5 80.0 - 100.0 fL   MCH  31.6 26.0 - 34.0 pg   MCHC 34.9 30.0 - 36.0 g/dL   RDW 13.9 11.5 - 15.5 %   Platelets 379 150 - 400 K/uL   nRBC 0.0 0.0 - 0.2 %   Neutrophils Relative % 66 %   Neutro Abs 6.8 1.7 - 7.7 K/uL   Lymphocytes Relative 25 %   Lymphs Abs 2.5 0.7 - 4.0 K/uL   Monocytes Relative 6 %   Monocytes Absolute 0.6 0.1 - 1.0 K/uL   Eosinophils Relative 1 %   Eosinophils Absolute 0.1 0.0 - 0.5 K/uL   Basophils Relative 1 %   Basophils Absolute 0.1 0.0 - 0.1 K/uL   Immature Granulocytes 1 %   Abs Immature Granulocytes 0.05 0.00 - 0.07 K/uL    Comment: Performed at [facility], [address], [place], [place] [address]  Magnesium     Status: None   Collection Time: 10/19/18  2:28 PM  Result Value Ref Range   Magnesium 1.8 1.7 - 2.4 mg/dL    Comment: Performed at [facility], [place] [address] [place], [place] [address]  POC CBG, ED     Status: Abnormal   Collection Time: 10/19/18  5:25 PM  Result Value Ref Range   Glucose-Capillary 529 (HH) 70 - 99 mg/dL   Comment 1 Notify RN    Comment 2 Document in Chart   CBG monitoring, ED     Status: Abnormal   Collection Time: 10/19/18  7:01 PM  Result Value Ref Range   Glucose-Capillary 538 (HH) 70 - 99 mg/dL   Comment 1 Notify RN    Comment 2 Document in Chart    Ct Head Wo Contrast  Result Date: 10/19/2018 CLINICAL DATA:  Altered mental status EXAM: CT HEAD WITHOUT CONTRAST TECHNIQUE: Contiguous axial images were obtained from the base of the skull through the vertex without intravenous contrast. COMPARISON:  None. FINDINGS: Brain: No evidence of acute infarction, hemorrhage, extra-axial collection, ventriculomegaly, or mass effect. Generalized cerebral atrophy. Periventricular white matter low attenuation likely secondary to microangiopathy. Vascular: Cerebrovascular atherosclerotic calcifications are noted. Skull: Negative for fracture or focal lesion. Sinuses/Orbits: Visualized portions of the orbits are unremarkable. Visualized portions of  the paranasal sinuses and mastoid air cells are unremarkable. Other: None. IMPRESSION: No acute intracranial pathology. Electronically Signed   By: Kathreen Devoid   On: 10/19/2018 16:56    Pending Labs Unresulted Labs (From admission, onward)    Start     Ordered   10/19/18 1848  Culture, blood (routine x 2)  BLOOD CULTURE X 2,   R (with STAT occurrences)     10/19/18 1847   10/19/18 1818  Osmolality  Add-on,   AD     10/19/18 1817   10/19/18 0923  Basic metabolic panel  Now then every 4 hours,   R (with STAT occurrences)     10/19/18 1811   10/19/18 1811  TSH  Once,   STAT     10/19/18 1811   10/19/18 1811  T4, free  Once,   STAT     10/19/18 1811   10/19/18 1242  Novel Coronavirus,NAA,(SEND-OUT TO REF  LAB - TAT 24-48 hrs); Hosp Order  (Asymptomatic Patients Labs)  Once,   STAT    Question:  Rule Out  Answer:  Yes   10/19/18 1242          Vitals/Pain Today's Vitals   10/19/18 1715 10/19/18 1730 10/19/18 1800 10/19/18 1830  BP: (!) 187/99 (!) 205/93 (!) 197/102 (!) 173/96  Pulse: (!) 116 (!) 119 (!) 120 (!) 130  Resp: (!) 25 (!) 27 (!) 27 (!) 28  Temp:      TempSrc:      SpO2: 93% 95% 99% 98%  PainSc:        Isolation Precautions No active isolations  Medications Medications  dextrose 5 %-0.45 % sodium chloride infusion (has no administration in time range)  dextrose 50 % solution 25 mL (has no administration in time range)  0.9 %  sodium chloride infusion (has no administration in time range)  enoxaparin (LOVENOX) injection 40 mg (has no administration in time range)  acetaminophen (TYLENOL) tablet 650 mg (has no administration in time range)    Or  acetaminophen (TYLENOL) suppository 650 mg (has no administration in time range)  polyethylene glycol (MIRALAX / GLYCOLAX) packet 17 g (has no administration in time range)  promethazine (PHENERGAN) tablet 12.5 mg (has no administration in time range)  thiamine (B-1) injection 100 mg (has no administration in time range)   folic acid injection 1 mg (has no administration in time range)  labetalol (NORMODYNE) injection 5 mg (has no administration in time range)  0.9 %  sodium chloride infusion (has no administration in time range)  insulin regular, human (MYXREDLIN) 100 units/ 100 mL infusion (has no administration in time range)  dextrose 5 %-0.45 % sodium chloride infusion (has no administration in time range)  potassium chloride 10 mEq in 100 mL IVPB (has no administration in time range)  levothyroxine (SYNTHROID, LEVOTHROID) injection 100 mcg (has no administration in time range)  sodium chloride 0.9 % bolus 1,000 mL (0 mLs Intravenous Stopped 10/19/18 1457)  haloperidol lactate (HALDOL) injection 2 mg (2 mg Intravenous Given 10/19/18 1348)  haloperidol lactate (HALDOL) injection 2 mg (2 mg Intravenous Given 10/19/18 1412)  sodium chloride 0.9 % bolus 1,000 mL (1,000 mLs Intravenous New Bag/Given 10/19/18 1457)  LORazepam (ATIVAN) injection 1 mg (1 mg Intravenous Given 10/19/18 1516)  insulin aspart (novoLOG) injection 8 Units (8 Units Intravenous Given 10/19/18 1545)  LORazepam (ATIVAN) injection 1 mg (1 mg Intravenous Given 10/19/18 1603)  haloperidol lactate (HALDOL) injection 2 mg (2 mg Intravenous Given 10/19/18 1731)  LORazepam (ATIVAN) injection 1 mg (1 mg Intravenous Given 10/19/18 1906)    Mobility non-ambulatory at moment due to confusion Moderate fall risk   Focused Assessments   No results found for: CKTOTAL, CKMB, CKMBINDEX, TROPONINI No results found for: DDIMER Does the Patient currently have chest pain? No   , Neuro Assessment Handoff:  Swallow screen pass? Yes      Last date known well: 10/19/18 Last time known well: 1045 Neuro Assessment: Exceptions to [facility] Neuro Checks:      If patient is a Neuro Trauma and patient is going to OR before floor call report to [place] nurse: [phone] or [phone]     R Recommendations: See Admitting Provider Note  Report given to:  Rollene Fare, RN  Additional Notes:

## 2018-10-19 NOTE — ED Provider Notes (Signed)
[facility] EMERGENCY DEPARTMENT Provider Note   CSN: [id number] Arrival date & time: 10/19/18  1237    History   Chief Complaint Chief Complaint  Patient presents with  . Altered Mental Status    HPI Matthew [name] is a 73 y.o. male.     HPI   He has been EMS for evaluation of altered mental status, with high blood sugar.  He is unable to give any history.   Level 5 caveat-altered mental status  Past Medical History:  Diagnosis Date  . Adenomatous polyps   . Benign neoplasm of colon   . Diabetes mellitus without complication ([place])   . External hemorrhoids   . Hemorrhoids   . Thyroid cancer ([place])     There are no active problems to display for this patient.   History reviewed. No pertinent surgical history.      Home Medications    Prior to Admission medications   Medication Sig Start Date End Date Taking? Authorizing Provider  amitriptyline (ELAVIL) 75 MG tablet Take 75 mg by mouth at bedtime as needed for sleep. 08/15/18  Yes [provider]  levothyroxine (SYNTHROID) 200 MCG tablet Take 200-400 mcg by mouth See admin instructions. Take 1 tablet by mouth once daily then take 2 tablets on the same day each week for hypothyroid 09/01/18  Yes [provider]  lisinopril (ZESTRIL) 20 MG tablet Take 20 mg by mouth daily. 09/01/18  Yes [provider]  metFORMIN (GLUCOPHAGE) 1000 MG tablet Take 1,000 mg by mouth 2 (two) times a day. 09/01/18  Yes [provider]  aspirin 81 MG tablet Take 81 mg by mouth daily.      [provider]  glimepiride (AMARYL) 4 MG tablet Take 4 mg by mouth daily. 06/03/18   [provider]    Family History Family History  Problem Relation Age of Onset  . Heart attack Father   . Hypertension Father   . Breast cancer Mother   . Hypertension Mother   . Hypertension Brother   . Colon cancer Neg Hx     Social History Social History   Tobacco Use  . Smoking  status: Current Every Day Smoker    Packs/day: 0.50    Types: Cigarettes  . Smokeless tobacco: Never Used  . Tobacco comment: Counseling sheet given in exam room for smoking   Substance Use Topics  . Alcohol use: Yes    Comment: occ  . Drug use: No     Allergies   Patient has no known allergies.   Review of Systems Review of Systems  All other systems reviewed and are negative.    Physical Exam Updated Vital Signs BP (!) 197/102   Pulse (!) 120   Temp 98.2 F (36.8 C) (Rectal)   Resp (!) 27   SpO2 99%   Physical Exam Vitals signs and nursing note reviewed.  Constitutional:      General: He is not in acute distress.    Appearance: He is well-developed. He is obese. He is ill-appearing. He is not toxic-appearing or diaphoretic.  HENT:     Head: Normocephalic and atraumatic.     Right Ear: External ear normal.     Left Ear: External ear normal.     Mouth/Throat:     Mouth: Mucous membranes are dry.     Pharynx: No oropharyngeal exudate or posterior oropharyngeal erythema.     Comments: No oral lesions Eyes:     Conjunctiva/sclera:  Conjunctivae normal.     Pupils: Pupils are equal, round, and reactive to light.  Neck:     Musculoskeletal: Normal range of motion and neck supple.     Trachea: Phonation normal.  Cardiovascular:     Rate and Rhythm: Normal rate and regular rhythm.     Heart sounds: Normal heart sounds.  Pulmonary:     Effort: Pulmonary effort is normal.     Breath sounds: Normal breath sounds.  Abdominal:     General: There is no distension.     Palpations: Abdomen is soft.     Tenderness: There is no abdominal tenderness. There is no rebound.     Hernia: No hernia is present.  Musculoskeletal: Normal range of motion.  Skin:    General: Skin is warm and dry.  Neurological:     Mental Status: He is alert.     Cranial Nerves: No cranial nerve deficit.     Sensory: No sensory deficit.     Motor: No abnormal muscle tone.     Coordination:  Coordination normal.     Comments: Responsive follows commands.  No nystagmus or aphasia.  Psychiatric:        Mood and Affect: Mood normal.        Speech: Speech is delayed.        Behavior: Behavior is agitated.        Thought Content: Thought content is delusional. Thought content does not include homicidal or suicidal plan.        Cognition and Memory: Memory is impaired.        Judgment: Judgment is inappropriate.      ED Treatments / Results  Labs (all labs ordered are listed, but only abnormal results are displayed) Labs Reviewed  COMPREHENSIVE METABOLIC PANEL - Abnormal; Notable for the following components:      Result Value   Sodium 130 (*)    Chloride 97 (*)    CO2 20 (*)    Glucose, Bld 607 (*)    Calcium 8.5 (*)    Albumin 3.2 (*)    Alkaline Phosphatase 143 (*)    All other components within normal limits  URINALYSIS, ROUTINE W REFLEX MICROSCOPIC - Abnormal; Notable for the following components:   Color, Urine STRAW (*)    Specific Gravity, Urine 1.032 (*)    Glucose, UA >=500 (*)    Hgb urine dipstick SMALL (*)    Ketones, ur 20 (*)    Protein, ur >=300 (*)    All other components within normal limits  CBG MONITORING, ED - Abnormal; Notable for the following components:   Glucose-Capillary >600 (*)    All other components within normal limits  POCT I-STAT EG7 - Abnormal; Notable for the following components:   pH, Ven 7.477 (*)    pCO2, Ven 31.7 (*)    pO2, Ven 164.0 (*)    Sodium 129 (*)    Calcium, Ion 0.97 (*)    All other components within normal limits  CBG MONITORING, ED - Abnormal; Notable for the following components:   Glucose-Capillary 529 (*)    All other components within normal limits  NOVEL CORONAVIRUS, NAA (HOSPITAL ORDER, SEND-OUT TO REF LAB)  CBC WITH DIFFERENTIAL/PLATELET  ETHANOL  RAPID URINE DRUG SCREEN, HOSP PERFORMED  MAGNESIUM    EKG EKG Interpretation  Date/Time:  Tuesday October 19 2018 12:48:28 EDT Ventricular Rate:  98  PR Interval:    QRS Duration: 95 QT Interval:  400 QTC Calculation:  511 R Axis:   112 Text Interpretation:  Sinus rhythm Anteroseptal infarct, age indeterminate Prolonged QT interval Since last tracing QT has lengthened Confirmed by Daleen Bo [phone]) on 10/19/2018 12:52:11 PM   Radiology Ct Head Wo Contrast  Result Date: 10/19/2018 CLINICAL DATA:  Altered mental status EXAM: CT HEAD WITHOUT CONTRAST TECHNIQUE: Contiguous axial images were obtained from the base of the skull through the vertex without intravenous contrast. COMPARISON:  None. FINDINGS: Brain: No evidence of acute infarction, hemorrhage, extra-axial collection, ventriculomegaly, or mass effect. Generalized cerebral atrophy. Periventricular white matter low attenuation likely secondary to microangiopathy. Vascular: Cerebrovascular atherosclerotic calcifications are noted. Skull: Negative for fracture or focal lesion. Sinuses/Orbits: Visualized portions of the orbits are unremarkable. Visualized portions of the paranasal sinuses and mastoid air cells are unremarkable. Other: None. IMPRESSION: No acute intracranial pathology. Electronically Signed   By: Kathreen Devoid   On: 10/19/2018 16:56    Procedures .Critical Care Performed by: Daleen Bo, MD Authorized by: Daleen Bo, MD   Critical care provider statement:    Critical care time (minutes):  55   Critical care start time:  10/19/2018 12:40 PM   Critical care end time:  10/19/2018 6:00 PM   Critical care time was exclusive of:  Separately billable procedures and treating other patients   Critical care was necessary to treat or prevent imminent or life-threatening deterioration of the following conditions:  CNS failure or compromise and metabolic crisis   Critical care was time spent personally by me on the following activities:  Blood draw for specimens, development of treatment plan with patient or surrogate, discussions with consultants, evaluation of patient's  response to treatment, examination of patient, obtaining history from patient or surrogate, ordering and performing treatments and interventions, ordering and review of laboratory studies, pulse oximetry, re-evaluation of patient's condition, review of old charts and ordering and review of radiographic studies   (including critical care time)  Medications Ordered in ED Medications  dextrose 5 %-0.45 % sodium chloride infusion (has no administration in time range)  insulin regular bolus via infusion 0-10 Units (has no administration in time range)  insulin regular, human (MYXREDLIN) 100 units/ 100 mL infusion (has no administration in time range)  dextrose 50 % solution 25 mL (has no administration in time range)  0.9 %  sodium chloride infusion (has no administration in time range)  sodium chloride 0.9 % bolus 1,000 mL (0 mLs Intravenous Stopped 10/19/18 1457)  haloperidol lactate (HALDOL) injection 2 mg (2 mg Intravenous Given 10/19/18 1348)  haloperidol lactate (HALDOL) injection 2 mg (2 mg Intravenous Given 10/19/18 1412)  sodium chloride 0.9 % bolus 1,000 mL (1,000 mLs Intravenous New Bag/Given 10/19/18 1457)  LORazepam (ATIVAN) injection 1 mg (1 mg Intravenous Given 10/19/18 1516)  insulin aspart (novoLOG) injection 8 Units (8 Units Intravenous Given 10/19/18 1545)  LORazepam (ATIVAN) injection 1 mg (1 mg Intravenous Given 10/19/18 1603)  haloperidol lactate (HALDOL) injection 2 mg (2 mg Intravenous Given 10/19/18 1731)     Initial Impression / Assessment and Plan / ED Course  I have reviewed the triage vital signs and the nursing notes.  Pertinent labs & imaging results that were available during my care of the patient were reviewed by me and considered in my medical decision making (see chart for details).  Clinical Course as of Oct 19 1806  Tue Oct 19, 2018  1458 Patient is calmer, he has required 2 doses of Haldol for agitation and confusion.  His daughter is here now  she states that  she called EMS today because he was confused when she went to his home.  She last saw him 1 week ago.  She is concerned about his compliance with medications.  She states he does not drink alcohol or use illegal drugs.   [EW]  1725 Normal except sodium low, chloride low, CO2 low, glucose high, calcium low, albumin low, alkaline phosphatase high  Comprehensive metabolic panel(!!) [EW]  1610 Normal except pH elevated, PCO2 low, PO2 high on venous testing  POCT I-Stat EG7(!) [EW]  1726 Normal  Magnesium [EW]  1726 Normal  CBC with Differential [EW]  1726 Normal  Ethanol [EW]  1726 Normal  Urine rapid drug screen (hosp performed) [EW]  1726 Normal except specific gravity high, glucose high, hemoglobin small, ketones elevated, protein high  Urinalysis, Routine w reflex microscopic(!) [EW]  1727 No acute abnormalities, images reviewed by me  CT Head Wo Contrast [EW]    Clinical Course User Index [EW] Daleen Bo, MD        Patient Vitals for the past 24 hrs:  BP Temp Temp src Pulse Resp SpO2  10/19/18 1800 (!) 197/102 - - (!) 120 (!) 27 99 %  10/19/18 1730 (!) 205/93 - - (!) 119 (!) 27 95 %  10/19/18 1715 (!) 187/99 - - (!) 116 (!) 25 93 %  10/19/18 1445 (!) 203/102 - - (!) 101 15 98 %  10/19/18 1430 (!) 210/110 - - 99 (!) 24 92 %  10/19/18 1330 (!) 220/105 - - - 15 -  10/19/18 1318 - 98.2 F (36.8 C) Rectal - - -  10/19/18 1315 (!) 214/110 - - 96 (!) 30 92 %  10/19/18 1300 (!) 205/101 - - 97 18 93 %  10/19/18 1247 (!) 186/97 (!) 97.5 F (36.4 C) Oral 100 20 95 %  10/19/18 1239 - - - - - 91 %    5:25 PM Reevaluation with update and discussion. After initial assessment and treatment, an updated evaluation reveals patient remains confused.  He is talking to himself, and obviously confused.  He follows commands but quickly forgets and seems uncomfortable.  Findings have been discussed with daughter.  He will require hospitalization.Daleen Bo   Medical Decision Making:  Patient with altered mental status without clear cause.  He does have high blood sugar, but is not in DKA.  He appears dehydrated.  There are no other apparent causes of toxic or metabolic abnormalities.  The patient appears chronically debilitated.  He is managed at home on oral medications for hypertension, and diabetes.  He is apparently noncompliant.  He has required multiple medications for sedation, to protect him and staff members while in the emergency department.  Since he remains acutely delirious, he will require hospitalization for further care and treatment.  Suspect hypertension, related to agitation.  Doubt hypertensive urgency.  Persistent hyperglycemia, after IV insulin given.  He will require additional treatment with IV fluids, and IV insulin.  He will require patient for management.  CRITICAL CARE- yes Performed by: Daleen Bo  Nursing Notes Reviewed/ Care Coordinated Applicable Imaging Reviewed Interpretation of Laboratory Data incorporated into ED treatment   5:59 PM-Consult complete with on-call resident. Patient case explained and discussed.  She agrees to admit patient for further evaluation and treatment. Call ended at 6:08 PM  Plan: Admit  Final Clinical Impressions(s) / ED Diagnoses   Final diagnoses:  Hyperglycemia  Delirium  Dehydration    ED Discharge Orders  None       Daleen Bo, MD 10/19/18 [phone]

## 2018-10-19 NOTE — ED Notes (Signed)
Pt cursing at staff and trying to climb out of bed, MD Eulis Foster aware and will order haldol.

## 2018-10-19 NOTE — ED Notes (Signed)
Patient transported to CT 

## 2018-10-19 NOTE — ED Notes (Signed)
Updated patient's family that patient had arrived to ED with

## 2018-10-19 NOTE — ED Notes (Signed)
Called staffing and they stated they can maybe send a sitter around 1500

## 2018-10-19 NOTE — H&P (Signed)
Date: 10/19/2018               Patient Name:  Matthew [name] MRN: [id number]  DOB: [birth year] Age / Sex: 73 y.o., male   PCP: Leonard Downing, MD         Medical Service: Internal Medicine Teaching Service         Attending Physician: Dr. Aldine Contes, MD    First Contact: Dr. Sherry Ruffing Pager: [phone]  Second Contact: Dr. Maricela Bo Pager: [phone]       After Hours (After 5p/  First Contact Pager: [phone]  weekends / holidays): Second Contact Pager: [phone]   Chief Complaint: Altered mental status  History of Present Illness: This is a 73 year old male with a history of hypothyroidism, CKD stage 2, thyroid cancer prior to 2013, COPD, HTN, and diabetes mellitus who presented with altered mental status. He was found in his room, lives with his elderly girlfriend, where he was noted to be altered and have an elevated blood sugar.   Spoke with daughter, she reports that the lady that live with him told her that he was delirious and was unable to see, difficulty walking. He has been acting odd and forgetting a lot of things, has been having bad headaches for awhile, for about 2 months, would come and go, had been put on a medication but does know if they help. She is not sure how often he takes his medications. He has been sleeping more often, more fatigued, more forgetful. Will sometimes have episodes where he will just stare off and blank out, also going on for about 2 month. Denies any recent travel or sick contact. Denies that he has any any fevers. Denied any previous episode of similar episodes. No recent history of medication changes. Dr. Lucia Gaskins performed thyroid surgery. He has not been to a hospital for years, Dr. Arelia Sneddon is his PCP, does not know the last time patient saw him.   In the ED he was noted to be afebrile, tachypneic to 30, tachycardic to 118, and hypertensive 205/101. Labs significant for glucose of >600, anion gap of 13, WBC of 10.2, Hgb 16.6, and Plt 379. VBG  showed pH 7.477, Pco2 of 31, and Po2 of 164. UDS negative. CT head showed not acute findings. U/A showed ketones 20, glucose >500, and protein >300. EtOH <10. He was given 8 units of IV insulin and 2L NS, CBGs still elevated at 529. He was also very agitated so he was given haldol x2. Admtited to internal medicine.   Meds:  Current Meds  Medication Sig  . amitriptyline (ELAVIL) 75 MG tablet Take 75 mg by mouth at bedtime as needed for sleep.  Marland Kitchen levothyroxine (SYNTHROID) 200 MCG tablet Take 200-400 mcg by mouth See admin instructions. Take 1 tablet by mouth once daily then take 2 tablets on the same day each week for hypothyroid  . lisinopril (ZESTRIL) 20 MG tablet Take 20 mg by mouth daily.  . metFORMIN (GLUCOPHAGE) 1000 MG tablet Take 1,000 mg by mouth 2 (two) times a day.     Allergies: Allergies as of 10/19/2018  . (No Known Allergies)   Past Medical History:  Diagnosis Date  . Adenomatous polyps   . Benign neoplasm of colon   . Diabetes mellitus without complication ([place])   . External hemorrhoids   . Hemorrhoids   . Thyroid cancer ([place])     Family History: Mother: CVA, breast cancer. COPD runs in family.  Social History: Smokes 2 packs per day, denies any etoh, denies any recreational drug use. Lives with a lady friends. He takes care of himself at home.   Review of Systems: A complete ROS was negative except as per HPI.   Physical Exam: Blood pressure (!) 197/102, pulse (!) 120, temperature 98.2 F (36.8 C), temperature source Rectal, resp. rate (!) 27, SpO2 99 %. Physical Exam  Constitutional:  Elderly male, tired appearing, laying in bed, occasionally mumbling words  HENT:  Head: Normocephalic and atraumatic.  Mouth/Throat: Oropharynx is clear and moist.  Eyes: Conjunctivae are normal.  Unable to assess EOMI, pupils normal in size  Neck: Normal range of motion. Neck supple. No thyromegaly present.  Cardiovascular: Regular rhythm.  Tachycardiac, no appreciable  m/r/g  Pulmonary/Chest:  Increased work of breathing, diffuse wheezing in BL lung fields  Abdominal: Soft. There is no rebound and no guarding.  Distended abdomen, hypoactive bowel sounds, no lesions, some groaning with diffuse palpation  Musculoskeletal: Normal range of motion.        General: No edema.  Neurological:  Not alert, opens eyes spontaneously, moves all extremities, not following commands  Skin: Skin is warm and dry.  Small bruises on hand  Psychiatric:  Not alert, unable to assess affect or mood, appears agitated    EKG: personally reviewed my interpretation is NSR, rate of 100, normal QT intervals, PR interval at .24, mild TWI in lead V1  Assessment & Plan by Problem: Active Problems:   Encephalopathy acute  This is a 73 year old male with history of thyroid cancer status post resection and hypothyroidism, hypertension, diabetes mellitus, CKD stage II, COPD who presented after being found to have altered mental status and significantly elevated hyperglycemia at home.  Altered mental status: Unclear cause at this time, he is been having frequent headaches and memory issues for the past few months and had been treated with a migraine medication, unsure which one.  On exam he had recently gotten Haldol, had intermittent agitation, he was able to move all extremities and open his eyes by himself, no neurological deficits found however unable to complete full neuro exam, he was also noted to have a distended abdomen with some tenderness to palpation.  He had a CT scan that was negative.  Labs have been relatively unremarkable except for the significant hyperglycemia. UDS is negative and alcohol was less than 10.  He was noted to be significantly hypertensive up to 205/101.  Patient has been afebrile and has no leukocytosis, no other significant findings for infection at this time.  Differential for his altered mental status include hypertensive encephalopathy, PRES, metabolic  encephalopathy, hypothyroidism, meningitis, delirium, severe hyperglycemia, intracranial infection, medication induced. Will need to obtain MRI when able to further evaluate. Given his abdominal distension and tenderness to palpation, and since he met criteria for SIRS, will obtain abd/pelvis CT scan to evaluate for intraabdominal infection. Patient will likely be unable to obtain this tonight due to his agitation, like to limit his sedation if possible.  -MRI brain -If MRI negative, can consider a lumbar puncture  -CBC and CMP in AM -Maitenance fluids at 150 cc/hr -Safety precautions -CT abdomen/pelvis -Will need to obtain records from PCP office   Hypertensive urgency: BP up to 205/101. He is on lisinopril 20 mg daily at home and reportedly has not been consistently taking this.  He has been having the acute encephalopathy noted above, there may be a component of dictation on adherence however given his significant high  blood pressure there is concern for the other differentials noted above.  -Labetalol PRN q4 hr for BP <500 systolic -Goal BP <938 systolic -Resume home BP meds as needed  Severe hyperglycemia: -Glucose up to 600, daughter reports that he does not always take his medications. No evidence of infection at this time, UDS was negative and alcohol was<10. Not currently in DKA.  She has no anion gap and his VBG actually showed a pH of 7.47, PCO2 31, PO2 of 164.  Has some ketones.  Will obtain osmolaltiy to assess for HHS. Is not on insulin at home, will treat with IV insulin and transition to SubQ once better controlled.   -Checking osm -IV insulin drip, transition to Sub Q insulin when CBG <250 -BMP q4 hr -Hold hold glimepiride and metformin  Thyroid cancer s/p surgical resection, hypothyroidism: -Patient is currently on 200 mg of Synthroid 6 days out of the week and 400 mg on the third day.  His last TSH is unknown.  He appears to have missed multiple doses of medications, and  is unclear how often he takes his medications. -Check TSH, T4 and T3 -Resume Synthroid, since he has AMS will transition to IV, Synthroid 100 mg IV  COPD: Has BL wheezing on exam and increased work of breathing. Not on any medications at this time.  -Obtain CXR -Duonebs PRN  CKD stage 2: -Reported by the daughter, Cr today was 1.12, unknown baseline.   FEN: NS 150 cc/hr, replete lytes prn, NPO VTE ppx: Lovenox  Code Status: FULL  Dispo: Admit patient to Observation with expected length of stay less than 2 midnights.  Signed: Asencion Noble, MD 10/19/2018, 6:43 PM  Pager: [phone]

## 2018-10-19 NOTE — ED Notes (Signed)
Pt attempting to rip all VS monitoring off and climb out of bed, this RN hooked pt back up and explained that he needed to stay in the bed. Door open and room in front of nurses station, Will continue to monitor.

## 2018-10-19 NOTE — ED Notes (Signed)
Daughter at bedside.

## 2018-10-19 NOTE — Discharge Summary (Signed)
Name: Matthew [name] MRN: [id number] DOB: 1945/08/11 73 y.o. PCP: Leonard Downing, MD  Date of Admission: 10/19/2018 12:37 PM Date of Discharge: 10/24/2018 Attending Physician: Aldine Contes  Discharge Diagnosis: 1. Acute encephalopathy, multifactorial causes 2. HHS, DM 3. Hypertensive urgency 4. Hypokalemia  5. Hypothyroidism 6. COPD  Discharge Medications: Allergies as of 10/24/2018   No Known Allergies     Medication List    STOP taking these medications   glimepiride 4 MG tablet Commonly known as: AMARYL     TAKE these medications   amitriptyline 75 MG tablet Commonly known as: ELAVIL Take 75 mg by mouth at bedtime as needed for sleep.   aspirin 81 MG tablet Take 81 mg by mouth daily.   blood glucose meter kit and supplies Dispense based on patient and insurance preference. Use up to four times daily as directed. (FOR ICD-10 E10.9, E11.9).   insulin glargine 100 UNIT/ML injection Commonly known as: LANTUS Inject 0.2 mLs (20 Units total) into the skin daily.   Insulin Glargine 100 UNIT/ML Solostar Pen Commonly known as: LANTUS Inject 20 Units into the skin daily. Notes to patient: Same as above.   levothyroxine 200 MCG tablet Commonly known as: SYNTHROID Take 200-400 mcg by mouth See admin instructions. Take 1 tablet by mouth once daily then take 2 tablets on the same day each week for hypothyroid   lisinopril 40 MG tablet Commonly known as: ZESTRIL Take 1 tablet (40 mg total) by mouth daily. What changed:   medication strength  how much to take   metFORMIN 1000 MG tablet Commonly known as: GLUCOPHAGE Take 1,000 mg by mouth 2 (two) times a day.     ASK your doctor about these medications   insulin starter kit- pen needles Misc 1 kit by Other route once for 1 dose. Ask about: Should I take this medication?       Disposition and follow-up:   Mr.[name] was discharged from [facility] in Stable condition.  At  the hospital follow up visit please address:  1.  Acute encephalopathy: Likely multifactorial due to HHS, hypothyroidism, and hypertension.  Please make that patient is taking his medications as prescribed.  Diabetes mellitus: Noted to be in HHS, A1c 11 started on Lantus 20 units daily and resumed metformin, discontinued glimepiride.  Please make sure he is taking his medications  Hypertensive urgency: Increased lisinopril to 40 mg daily  Hypothyroidism: Continued on his 200 mcg of Synthroid daily, with 1 day a week of 400 mcg  Hypokalemia: -Potassium was 3.5 on day of discharge, please repeat labs.  2.  Labs / imaging needed at time of follow-up: CBC, BMP  3.  Pending labs/ test needing follow-up: 10/20/18 CSF Paraneoplastic, Autoantibody   Follow-up Appointments: Follow-up Information    Leonard Downing, MD. Schedule an appointment as soon as possible for a visit in 1 week(s).   Specialty: Family Medicine Contact information: [place] [address] [address] Hospital Course by problem list: 1. Acute encephalopathy, multifactorial causes:  2. HHS, DM 3. Hypertensive urgency: 4. Hypothyroidism: This is a 73 year old male with history of hypothyroidism, CKD stage II, hx of thyroid cancer,  COPD, hypertension, and diabetes presented with altered mental status and was found in his room.  Reported having worsening headaches for 2 months, worsening confusion, and likely noncompliance to medication.  He was noted to be very lethargic and minimally  responsive.  He was found to be tachypneic, tachycardic, and hypertensive up to 205/101.  Labs showed a glucose of>600, no anion gap.  VBG showed pH 7.477, Pco2 of 31, and Po2 of 164. UDS negative. CT head showed not acute findings. U/A showed ketones 20, glucose >500, and protein >300.  MRI showed no acute findings.  Treated blood pressure with IV antihypertensives and is HHS with IV insulin drip.   Patient was also noted to have hypothyroidism, TSH 18, Free T4 0.6, likely due to medication noncompliance, treated with home synthroid dose.  EEG was normal.  Given the symptoms and inability to obtain more information from patient, neurology was consulted to perform LP.  LP showed protein of 52, glucose of 117, and WBC of 24.  CSF cultures and blood cultures were negative.  He was initially treated with acyclovir, HSV CSF came back negative so this was discontinued.  Given his history of thyroid cancer and a paraneoplastic autoantibody was sent for evaluation.  Improved with treatment of his blood pressure, diabetes, and hypothyroidism. PT and OT evaluated, recommended CIR versus max home health services, discussed with patient and family and they would prefer for him to return home.  Patient was discharged home with home health services. He was discharged home on increased dose of lisinopril 20 mg, same dose of Synthroid 200 mcg daily with 1 day a week of 400 mcg.  5. Hypokalemia:  K down to 2.2 during admission, likely due to decreased oral intake. This was repleted and K on day of discharge was 3.5   6. COPD: Treated with duo nebs PRN and nicotine patch.  Discharge Vitals:   BP (!) 175/90 (BP Location: Left Arm)    Pulse 95    Temp (!) 97.3 F (36.3 C) (Oral)    Resp 20    Ht _0  (1.803 m)    Wt 117 kg    SpO2 94%    BMI 35.98 kg/m   Pertinent Labs, Studies, and Procedures:   CBC Latest Ref Rng & Units 10/22/2018 10/21/2018 10/19/2018  WBC 4.0 - 10.5 K/uL 13.9(H) 18.9(H) 10.2  Hemoglobin 13.0 - 17.0 g/dL 15.6 15.6 16.7  Hematocrit 39.0 - 52.0 % 44.2 44.8 47.9  Platelets 150 - 400 K/uL 349 328 379   BMP Latest Ref Rng & Units 10/24/2018 10/23/2018 10/23/2018  Glucose 70 - 99 mg/dL 149(H) - 115(H)  BUN 8 - 23 mg/dL 9 - 11  Creatinine 0.61 - 1.24 mg/dL 0.88 - 0.91  Sodium 135 - 145 mmol/L 138 - 138  Potassium 3.5 - 5.1 mmol/L 3.5 4.0 3.1(L)  Chloride 98 - 111 mmol/L 106 - 107  CO2 22 - 32  mmol/L 24 - 22  Calcium 8.9 - 10.3 mg/dL 7.6(L) - 7.4(L)   CT head: IMPRESSION: No acute intracranial pathology.  CT abdomen/pelvis: IMPRESSION: 1. No acute intra-abdominal abnormality detected. 2. Severe hepatic steatosis. 3. Large gallstone is again noted at the gallbladder neck. There is no CT evidence of acute cholecystitis. 4. Moderately distended urinary bladder.  MRI brain wo contrast:  IMPRESSION: 1. No acute intracranial abnormality. 2. 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    Discharge Instructions: Discharge Instructions    Call MD for:  difficulty breathing, headache or visual disturbances   Complete by: As directed    Call MD for:  extreme fatigue   Complete by: As directed    Call MD for:  hives  Complete by: As directed    Call MD for:  persistant dizziness or light-headedness   Complete by: As directed    Call MD for:  persistant nausea and vomiting   Complete by: As directed    Call MD for:  redness, tenderness, or signs of infection (pain, swelling, redness, odor or green/yellow discharge around incision site)   Complete by: As directed    Call MD for:  severe uncontrolled pain   Complete by: As directed    Call MD for:  temperature >100.4   Complete by: As directed    Diet - low sodium heart healthy   Complete by: As directed    Increase activity slowly   Complete by: As directed       Signed: Asencion Noble, MD 10/28/2018, 5:15 PM   Pager: [phone]

## 2018-10-19 NOTE — ED Notes (Signed)
Pt given water 

## 2018-10-20 ENCOUNTER — Observation Stay (HOSPITAL_COMMUNITY): Payer: Medicare Other

## 2018-10-20 ENCOUNTER — Inpatient Hospital Stay (HOSPITAL_COMMUNITY): Payer: Medicare Other

## 2018-10-20 DIAGNOSIS — F17210 Nicotine dependence, cigarettes, uncomplicated: Secondary | ICD-10-CM | POA: Diagnosis present

## 2018-10-20 DIAGNOSIS — E860 Dehydration: Secondary | ICD-10-CM | POA: Diagnosis present

## 2018-10-20 DIAGNOSIS — G92 Toxic encephalopathy: Secondary | ICD-10-CM

## 2018-10-20 DIAGNOSIS — R410 Disorientation, unspecified: Secondary | ICD-10-CM

## 2018-10-20 DIAGNOSIS — Z79890 Hormone replacement therapy (postmenopausal): Secondary | ICD-10-CM

## 2018-10-20 DIAGNOSIS — G9340 Encephalopathy, unspecified: Secondary | ICD-10-CM | POA: Diagnosis not present

## 2018-10-20 DIAGNOSIS — Z85850 Personal history of malignant neoplasm of thyroid: Secondary | ICD-10-CM

## 2018-10-20 DIAGNOSIS — Z1159 Encounter for screening for other viral diseases: Secondary | ICD-10-CM | POA: Diagnosis not present

## 2018-10-20 DIAGNOSIS — E1122 Type 2 diabetes mellitus with diabetic chronic kidney disease: Secondary | ICD-10-CM | POA: Diagnosis present

## 2018-10-20 DIAGNOSIS — E876 Hypokalemia: Secondary | ICD-10-CM | POA: Diagnosis present

## 2018-10-20 DIAGNOSIS — Z7984 Long term (current) use of oral hypoglycemic drugs: Secondary | ICD-10-CM

## 2018-10-20 DIAGNOSIS — Z8661 Personal history of infections of the central nervous system: Secondary | ICD-10-CM | POA: Diagnosis not present

## 2018-10-20 DIAGNOSIS — I160 Hypertensive urgency: Secondary | ICD-10-CM | POA: Diagnosis present

## 2018-10-20 DIAGNOSIS — Z823 Family history of stroke: Secondary | ICD-10-CM | POA: Diagnosis not present

## 2018-10-20 DIAGNOSIS — B349 Viral infection, unspecified: Secondary | ICD-10-CM | POA: Diagnosis present

## 2018-10-20 DIAGNOSIS — J449 Chronic obstructive pulmonary disease, unspecified: Secondary | ICD-10-CM | POA: Diagnosis present

## 2018-10-20 DIAGNOSIS — E878 Other disorders of electrolyte and fluid balance, not elsewhere classified: Secondary | ICD-10-CM | POA: Diagnosis not present

## 2018-10-20 DIAGNOSIS — K8020 Calculus of gallbladder without cholecystitis without obstruction: Secondary | ICD-10-CM | POA: Diagnosis present

## 2018-10-20 DIAGNOSIS — Z803 Family history of malignant neoplasm of breast: Secondary | ICD-10-CM | POA: Diagnosis not present

## 2018-10-20 DIAGNOSIS — N182 Chronic kidney disease, stage 2 (mild): Secondary | ICD-10-CM | POA: Diagnosis present

## 2018-10-20 DIAGNOSIS — E039 Hypothyroidism, unspecified: Secondary | ICD-10-CM | POA: Diagnosis present

## 2018-10-20 DIAGNOSIS — I129 Hypertensive chronic kidney disease with stage 1 through stage 4 chronic kidney disease, or unspecified chronic kidney disease: Secondary | ICD-10-CM | POA: Diagnosis present

## 2018-10-20 DIAGNOSIS — H539 Unspecified visual disturbance: Secondary | ICD-10-CM | POA: Diagnosis present

## 2018-10-20 DIAGNOSIS — K644 Residual hemorrhoidal skin tags: Secondary | ICD-10-CM | POA: Diagnosis present

## 2018-10-20 DIAGNOSIS — E1165 Type 2 diabetes mellitus with hyperglycemia: Secondary | ICD-10-CM | POA: Diagnosis present

## 2018-10-20 DIAGNOSIS — R739 Hyperglycemia, unspecified: Secondary | ICD-10-CM | POA: Diagnosis not present

## 2018-10-20 DIAGNOSIS — C73 Malignant neoplasm of thyroid gland: Secondary | ICD-10-CM | POA: Diagnosis present

## 2018-10-20 DIAGNOSIS — Z86010 Personal history of colonic polyps: Secondary | ICD-10-CM | POA: Diagnosis not present

## 2018-10-20 DIAGNOSIS — Z79899 Other long term (current) drug therapy: Secondary | ICD-10-CM

## 2018-10-20 DIAGNOSIS — E890 Postprocedural hypothyroidism: Secondary | ICD-10-CM

## 2018-10-20 DIAGNOSIS — E1100 Type 2 diabetes mellitus with hyperosmolarity without nonketotic hyperglycemic-hyperosmolar coma (NKHHC): Secondary | ICD-10-CM | POA: Diagnosis not present

## 2018-10-20 DIAGNOSIS — K760 Fatty (change of) liver, not elsewhere classified: Secondary | ICD-10-CM | POA: Diagnosis present

## 2018-10-20 DIAGNOSIS — Z8249 Family history of ischemic heart disease and other diseases of the circulatory system: Secondary | ICD-10-CM | POA: Diagnosis not present

## 2018-10-20 DIAGNOSIS — Z825 Family history of asthma and other chronic lower respiratory diseases: Secondary | ICD-10-CM | POA: Diagnosis not present

## 2018-10-20 DIAGNOSIS — Z923 Personal history of irradiation: Secondary | ICD-10-CM

## 2018-10-20 LAB — BASIC METABOLIC PANEL
Anion gap: 9 (ref 5–15)
Anion gap: 14 (ref 5–15)
Anion gap: 13 (ref 5–15)
Anion gap: 12 (ref 5–15)
BUN: 10 mg/dL (ref 8–23)
BUN: 13 mg/dL (ref 8–23)
BUN: 16 mg/dL (ref 8–23)
BUN: 16 mg/dL (ref 8–23)
CO2: 13 mmol/L — ABNORMAL LOW (ref 22–32)
CO2: 18 mmol/L — ABNORMAL LOW (ref 22–32)
CO2: 18 mmol/L — ABNORMAL LOW (ref 22–32)
CO2: 19 mmol/L — ABNORMAL LOW (ref 22–32)
Calcium: 5.3 mg/dL — CL (ref 8.9–10.3)
Calcium: 8.1 mg/dL — ABNORMAL LOW (ref 8.9–10.3)
Calcium: 8.2 mg/dL — ABNORMAL LOW (ref 8.9–10.3)
Calcium: 8.1 mg/dL — ABNORMAL LOW (ref 8.9–10.3)
Chloride: 117 mmol/L — ABNORMAL HIGH (ref 98–111)
Chloride: 106 mmol/L (ref 98–111)
Chloride: 104 mmol/L (ref 98–111)
Chloride: 105 mmol/L (ref 98–111)
Creatinine, Ser: 0.95 mg/dL (ref 0.61–1.24)
Creatinine, Ser: 1.28 mg/dL — ABNORMAL HIGH (ref 0.61–1.24)
Creatinine, Ser: 1.2 mg/dL (ref 0.61–1.24)
Creatinine, Ser: 1.08 mg/dL (ref 0.61–1.24)
GFR calc Af Amer: >60 mL/min (ref >60)
GFR calc Af Amer: >60 mL/min (ref >60)
GFR calc Af Amer: >60 mL/min (ref >60)
GFR calc Af Amer: >60 mL/min (ref >60)
GFR calc non Af Amer: >60 mL/min (ref >60)
GFR calc non Af Amer: 56 mL/min — ABNORMAL LOW (ref >60)
GFR calc non Af Amer: >60 mL/min (ref >60)
GFR calc non Af Amer: >60 mL/min (ref >60)
Glucose, Bld: 188 mg/dL — ABNORMAL HIGH (ref 70–99)
Glucose, Bld: 239 mg/dL — ABNORMAL HIGH (ref 70–99)
Glucose, Bld: 210 mg/dL — ABNORMAL HIGH (ref 70–99)
Glucose, Bld: 161 mg/dL — ABNORMAL HIGH (ref 70–99)
Potassium: 2.2 mmol/L — CL (ref 3.5–5.1)
Potassium: 3.3 mmol/L — ABNORMAL LOW (ref 3.5–5.1)
Potassium: 3.2 mmol/L — ABNORMAL LOW (ref 3.5–5.1)
Potassium: 3 mmol/L — ABNORMAL LOW (ref 3.5–5.1)
Sodium: 139 mmol/L (ref 135–145)
Sodium: 138 mmol/L (ref 135–145)
Sodium: 135 mmol/L (ref 135–145)
Sodium: 136 mmol/L (ref 135–145)

## 2018-10-20 LAB — GLUCOSE, CAPILLARY
Glucose-Capillary: 360 mg/dL — ABNORMAL HIGH (ref 70–99)
Glucose-Capillary: 281 mg/dL — ABNORMAL HIGH (ref 70–99)
Glucose-Capillary: 232 mg/dL — ABNORMAL HIGH (ref 70–99)
Glucose-Capillary: 234 mg/dL — ABNORMAL HIGH (ref 70–99)
Glucose-Capillary: 261 mg/dL — ABNORMAL HIGH (ref 70–99)
Glucose-Capillary: 276 mg/dL — ABNORMAL HIGH (ref 70–99)
Glucose-Capillary: 192 mg/dL — ABNORMAL HIGH (ref 70–99)
Glucose-Capillary: 178 mg/dL — ABNORMAL HIGH (ref 70–99)
Glucose-Capillary: 144 mg/dL — ABNORMAL HIGH (ref 70–99)
Glucose-Capillary: 177 mg/dL — ABNORMAL HIGH (ref 70–99)
Glucose-Capillary: 231 mg/dL — ABNORMAL HIGH (ref 70–99)
Glucose-Capillary: 311 mg/dL — ABNORMAL HIGH (ref 70–99)
Glucose-Capillary: 258 mg/dL — ABNORMAL HIGH (ref 70–99)

## 2018-10-20 LAB — BLOOD GAS, VENOUS
Acid-base deficit: 0.3 mmol/L (ref 0.0–2.0)
Bicarbonate: 24.2 mmol/L (ref 20.0–28.0)
O2 Saturation: 73.5 %
Patient temperature: 98.6
pCO2, Ven: 41.7 mmHg — ABNORMAL LOW (ref 44.0–60.0)
pH, Ven: 7.381 (ref 7.250–7.430)
pO2, Ven: 38.7 mmHg (ref 32.0–45.0)

## 2018-10-20 LAB — CSF CELL COUNT WITH DIFFERENTIAL
Eosinophils, CSF: NONE SEEN % (ref 0–1)
Lymphs, CSF: 3 % — ABNORMAL LOW (ref 40–80)
Monocyte-Macrophage-Spinal Fluid: 20 % (ref 15–45)
RBC Count, CSF: 3 /mm3 — ABNORMAL HIGH
Segmented Neutrophils-CSF: 77 % — ABNORMAL HIGH (ref 0–6)
Tube #: 3
WBC, CSF: 24 /mm3 (ref 0–5)

## 2018-10-20 LAB — VITAMIN B12: Vitamin B-12: 512 pg/mL (ref 180–914)

## 2018-10-20 LAB — HEMOGLOBIN A1C
Hgb A1c MFr Bld: 11.5 % — ABNORMAL HIGH (ref 4.8–5.6)
Mean Plasma Glucose: 283.35 mg/dL

## 2018-10-20 LAB — MAGNESIUM: Magnesium: 1.6 mg/dL — ABNORMAL LOW (ref 1.7–2.4)

## 2018-10-20 LAB — PROTEIN AND GLUCOSE, CSF
Glucose, CSF: 117 mg/dL — ABNORMAL HIGH (ref 40–70)
Total  Protein, CSF: 54 mg/dL — ABNORMAL HIGH (ref 15–45)

## 2018-10-20 LAB — NOVEL CORONAVIRUS, NAA (HOSP ORDER, SEND-OUT TO REF LAB; TAT 18-24 HRS): SARS-CoV-2, NAA: NOT DETECTED

## 2018-10-20 LAB — HIV ANTIBODY (ROUTINE TESTING W REFLEX): HIV Screen 4th Generation wRfx: NONREACTIVE

## 2018-10-20 IMAGING — MR MRI HEAD WITHOUT CONTRAST
12 of 13 series · 44 of 48 positions shown · non-contrast
Comparison: Head CT [DATE]

CLINICAL DATA: Encephalopathy.  Headaches.  Hypertension.

EXAM:
MRI HEAD WITHOUT CONTRAST
TECHNIQUE: Multiplanar, multiecho pulse sequences of the brain and surrounding
structures were obtained without intravenous contrast.

[Series 5: DWI · axial · 3.0mm · 0.88mm/px · z∈[-34,+104]mm · 7 of 96 slices shown (1 of 4)]
[im 1/96]
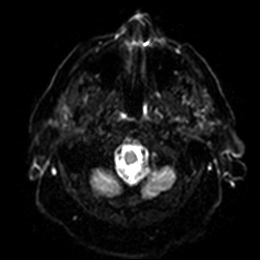
[im 16/96]
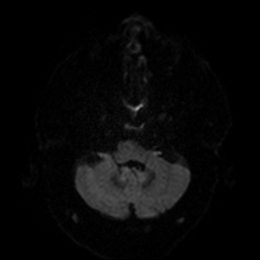
[im 32/96]
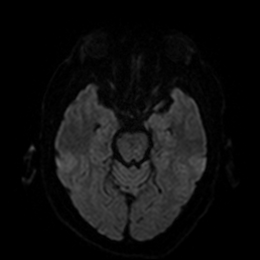
[im 48/96]
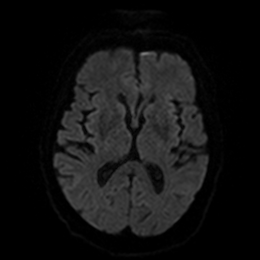
[im 64/96]
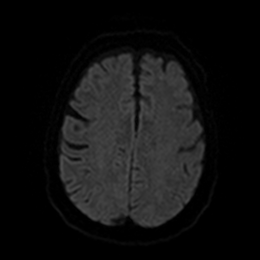
[im 80/96]
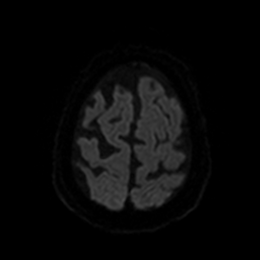
[im 96/96]
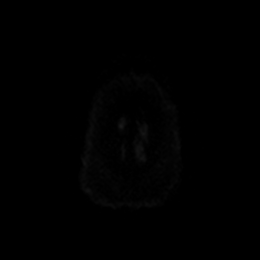

[Series 6: DWI · axial · 3.0mm · 0.88mm/px · z∈[-34,+104]mm · 4 of 48 slices shown (2 of 4)]
[im 1/48]
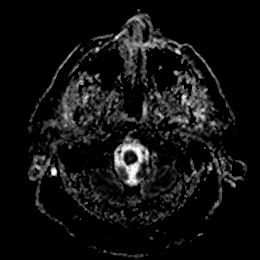
[im 16/48]
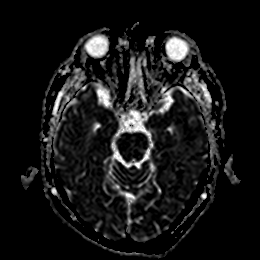
[im 32/48]
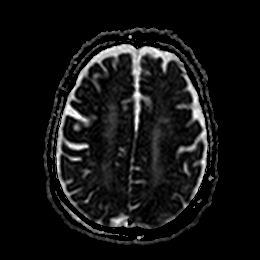
[im 48/48]
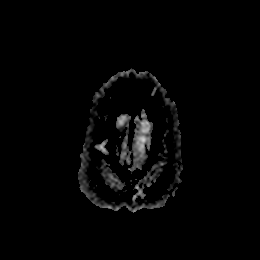

[Series 7: DWI · coronal · 4.0mm · 0.88mm/px · 6 of 70 slices shown (3 of 4)]
[im 1/70]
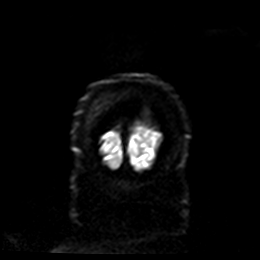
[im 14/70]
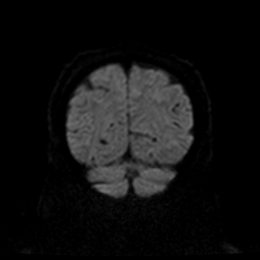
[im 28/70]
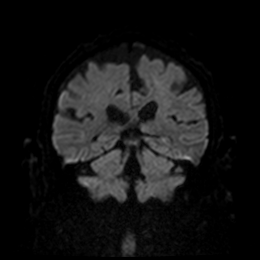
[im 42/70]
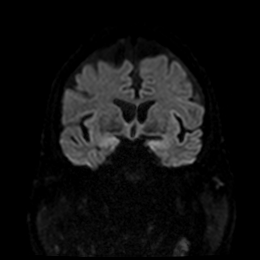
[im 56/70]
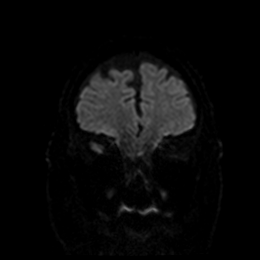
[im 70/70]
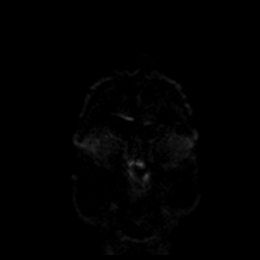

[Series 8: DWI · coronal · 4.0mm · 0.88mm/px · 3 of 35 slices shown (4 of 4)]
[im 1/35]
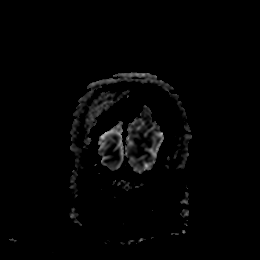
[im 18/35]
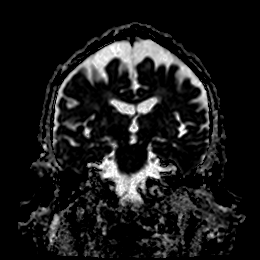
[im 35/35]
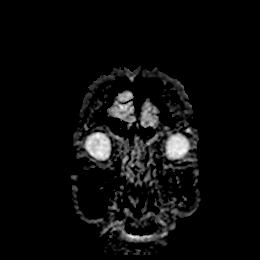

[Series 9: T1 · sagittal · 5.0mm · 0.75mm/px · 2 of 23 slices shown]
[im 1/23]
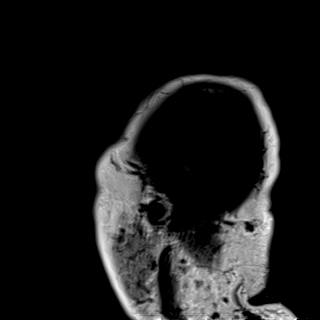
[im 23/23]
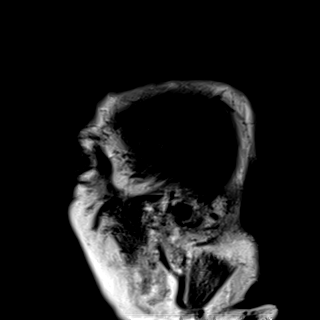

[Series 10: T2 · axial · 5.0mm · 0.72mm/px · z∈[-36,+105]mm · 2 of 25 slices shown (1 of 2)]
[im 1/25]
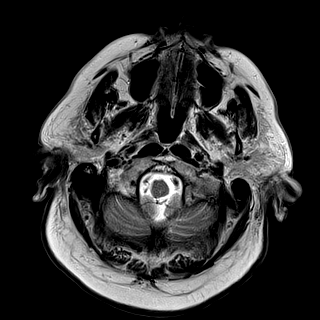
[im 25/25]
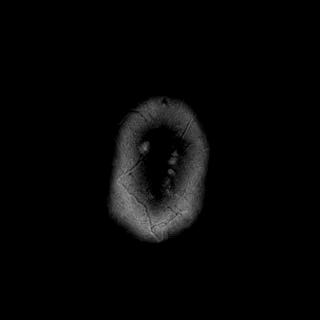

[Series 11: FLAIR · axial · 5.0mm · 0.45mm/px · z∈[-34,+106]mm · 2 of 25 slices shown]
[im 1/25]
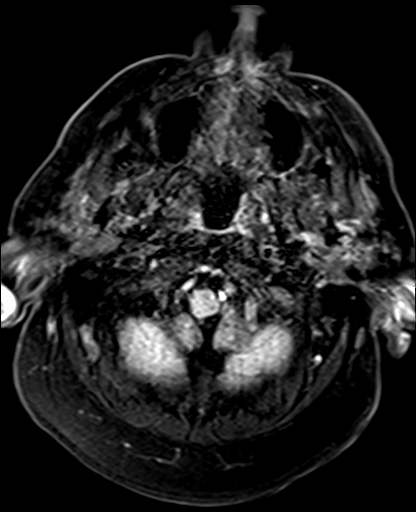
[im 25/25]
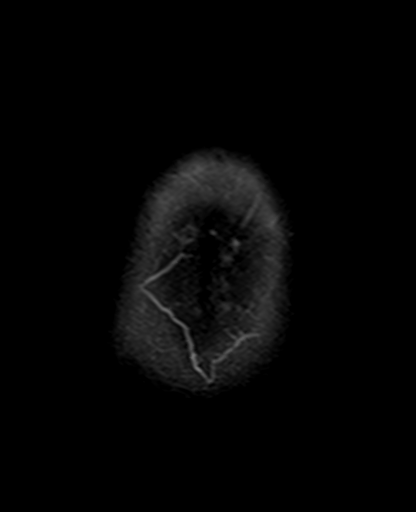

[Series 12: mag_images · axial · 3.0mm · 0.90mm/px · z∈[-39,+110]mm · 4 of 52 slices shown]
[im 1/52]
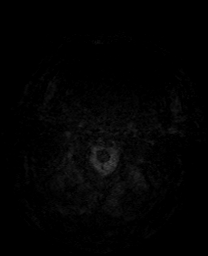
[im 18/52]
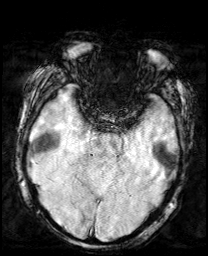
[im 35/52]
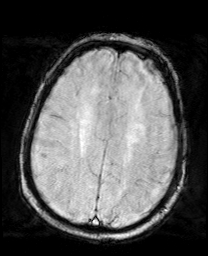
[im 52/52]
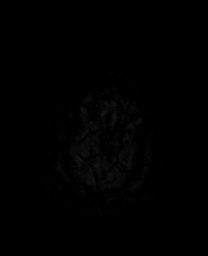

[Series 13: pha_images · axial · 3.0mm · 0.90mm/px · z∈[-39,+110]mm · 4 of 52 slices shown]
[im 1/52]
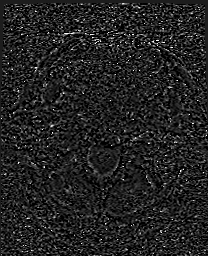
[im 18/52]
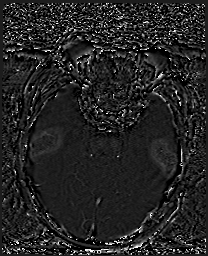
[im 35/52]
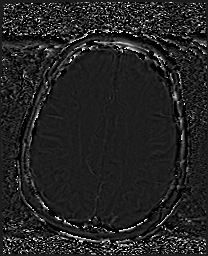
[im 52/52]
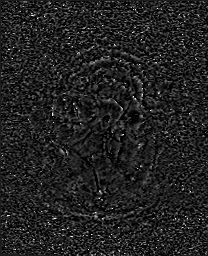

[Series 14: swi_images · axial · 3.0mm · 0.90mm/px · z∈[-39,+110]mm · 4 of 52 slices shown]
[im 1/52]
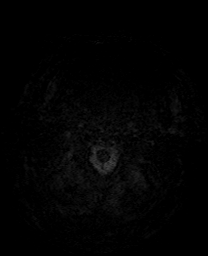
[im 18/52]
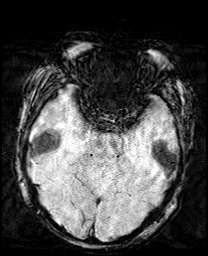
[im 35/52]
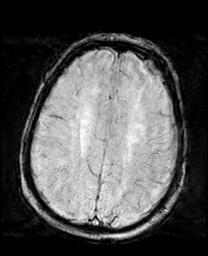
[im 52/52]
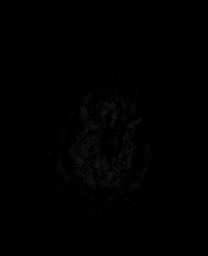

[Series 15: mip_images(sw) · axial · 24.0mm · 0.90mm/px · z∈[-29,+100]mm · 4 of 45 slices shown]
[im 1/45]
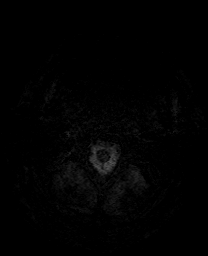
[im 15/45]
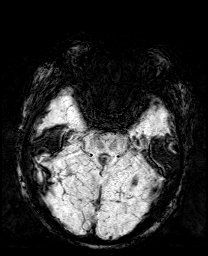
[im 30/45]
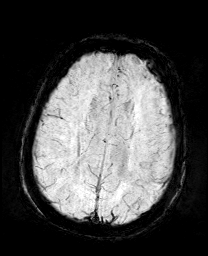
[im 45/45]
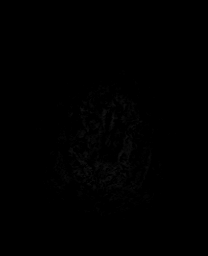

[Series 17: T2 · coronal · 5.0mm · 0.72mm/px · 2 of 29 slices shown (2 of 2)]
[im 1/29]
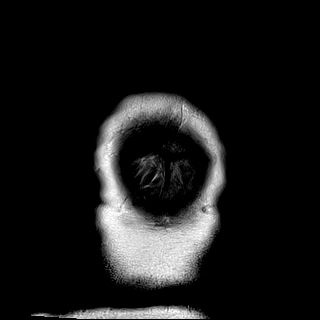
[im 29/29]
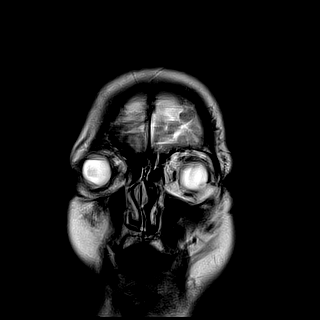

[44 of 48 positions shown; findings below may reference images not displayed]

FINDINGS: The study is mildly motion degraded.

Brain: There is no evidence of acute infarct, mass, midline shift,
or extra-axial fluid collection. A small chronic left
parieto-occipital infarct is noted with a small amount of chronic
blood products in this region including possible remote subarachnoid
hemorrhage. There is also a small chronic subcortical infarct in the
posterior left frontal lobe. Patchy to confluent T2 hyperintensities
elsewhere in the cerebral white matter bilaterally are nonspecific
but compatible with moderate chronic small vessel ischemic disease.
Mild chronic small vessel changes are present in the brainstem.
Cerebral atrophy is most notable in the frontal and parietal lobes.

Vascular: Major intracranial vascular flow voids are preserved.

Skull and upper cervical spine: Unremarkable bone marrow signal.

Sinuses/Orbits: Left cataract extraction. Paranasal sinuses and
mastoid air cells are clear.

Other: None.
IMPRESSION: 1. No acute intracranial abnormality.
2. Moderate chronic small vessel ischemic disease with small chronic
left parieto-occipital and left frontal infarcts.

## 2018-10-20 IMAGING — DX PORTABLE CHEST - 1 VIEW
1 series · 1 of 1 positions shown · non-contrast
Comparison: [DATE].  CT [DATE].

CLINICAL DATA: Fever.

EXAM:
PORTABLE CHEST 1 VIEW

[chest]
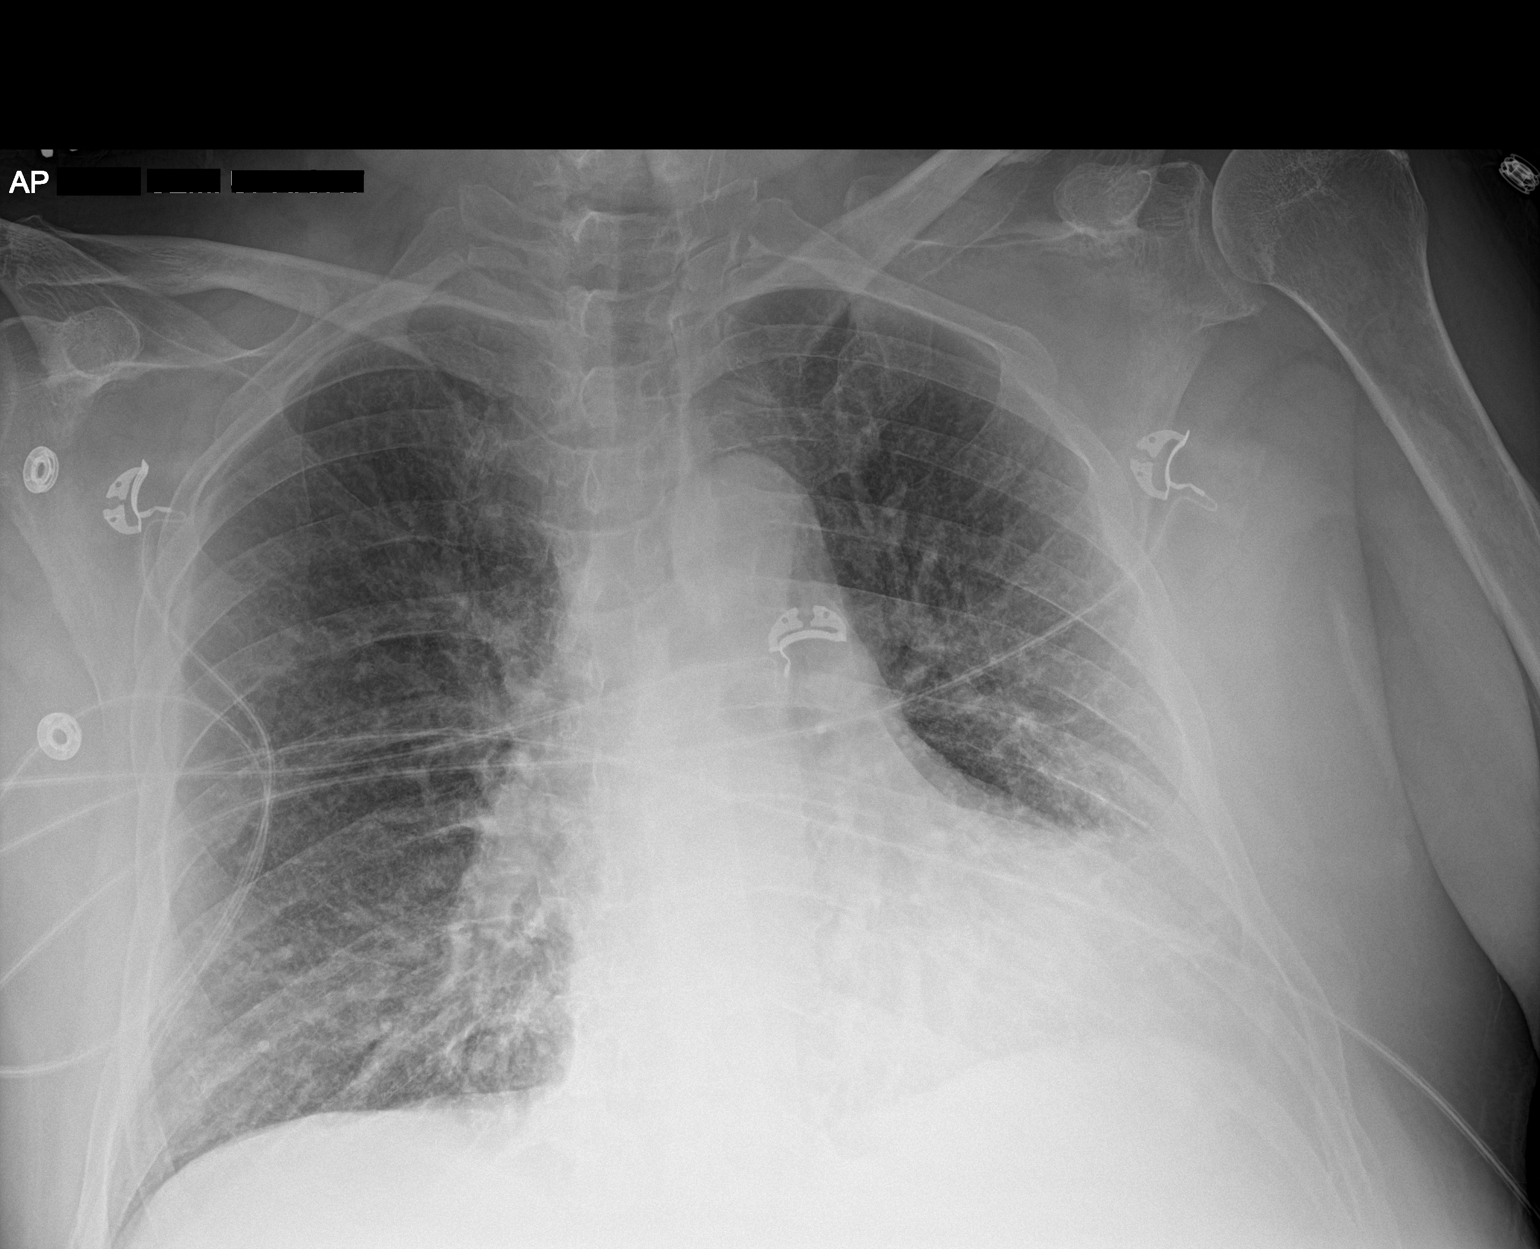

[1 of 1 positions shown; findings below may reference images not displayed]

FINDINGS: Stable cardiomegaly. No pulmonary venous congestion. Diffuse
bilateral pulmonary interstitial prominence noted. Interstitial
edema and/or pneumonitis could present in this fashion. No prominent
pleural effusion. No pneumothorax. Degenerative change thoracic
spine. Thoracic spine scoliosis.
IMPRESSION: 1.  Stable cardiomegaly.

2. Diffuse bilateral pulmonary interstitial prominence noted.
Interstitial edema and/or pneumonitis could present in this fashion.

## 2018-10-20 MED ORDER — DEXTROSE 5 % IV SOLN
10.0000 mg/kg | Freq: Three times a day (TID) | INTRAVENOUS | Status: DC
  Administered 2018-10-20 – 2018-10-22 (×6): 920 mg via INTRAVENOUS
  Filled 2018-10-20 (×8): qty 18.4

## 2018-10-20 MED ORDER — IPRATROPIUM-ALBUTEROL 0.5-2.5 (3) MG/3ML IN SOLN
3.0000 mL | Freq: Two times a day (BID) | RESPIRATORY_TRACT | Status: DC
  Administered 2018-10-20 – 2018-10-23 (×7): 3 mL via RESPIRATORY_TRACT
  Filled 2018-10-20 (×7): qty 3

## 2018-10-20 MED ORDER — POTASSIUM CHLORIDE 10 MEQ/100ML IV SOLN: 10.0000 meq | INTRAVENOUS | Status: DC

## 2018-10-20 MED ORDER — INSULIN ASPART 100 UNIT/ML ~~LOC~~ SOLN
0.0000 [IU] | SUBCUTANEOUS | Status: DC
  Administered 2018-10-20: 8 [IU] via SUBCUTANEOUS
  Administered 2018-10-20: 11 [IU] via SUBCUTANEOUS
  Administered 2018-10-21: 5 [IU] via SUBCUTANEOUS
  Administered 2018-10-21: 8 [IU] via SUBCUTANEOUS
  Administered 2018-10-21: 09:00:00 5 [IU] via SUBCUTANEOUS
  Administered 2018-10-21 (×2): 8 [IU] via SUBCUTANEOUS
  Administered 2018-10-21: 3 [IU] via SUBCUTANEOUS
  Administered 2018-10-22 (×3): 5 [IU] via SUBCUTANEOUS
  Administered 2018-10-22: 3 [IU] via SUBCUTANEOUS
  Administered 2018-10-22: 2 [IU] via SUBCUTANEOUS
  Administered 2018-10-22: 3 [IU] via SUBCUTANEOUS
  Administered 2018-10-23 (×2): 2 [IU] via SUBCUTANEOUS
  Administered 2018-10-23 (×3): 3 [IU] via SUBCUTANEOUS
  Administered 2018-10-24: 13:00:00 8 [IU] via SUBCUTANEOUS
  Administered 2018-10-24: 3 [IU] via SUBCUTANEOUS

## 2018-10-20 MED ORDER — MAGNESIUM SULFATE 2 GM/50ML IV SOLN
2.0000 g | Freq: Once | INTRAVENOUS | Status: AC
  Administered 2018-10-20: 2 g via INTRAVENOUS
  Filled 2018-10-20: qty 50

## 2018-10-20 MED ORDER — INSULIN GLARGINE 100 UNIT/ML ~~LOC~~ SOLN
10.0000 [IU] | Freq: Every day | SUBCUTANEOUS | Status: DC
  Administered 2018-10-20 – 2018-10-21 (×2): 10 [IU] via SUBCUTANEOUS
  Filled 2018-10-20 (×2): qty 0.1

## 2018-10-20 MED ORDER — NICOTINE 21 MG/24HR TD PT24
21.0000 mg | MEDICATED_PATCH | Freq: Every day | TRANSDERMAL | Status: DC
  Administered 2018-10-20 – 2018-10-24 (×5): 21 mg via TRANSDERMAL
  Filled 2018-10-20 (×5): qty 1

## 2018-10-20 MED ORDER — POTASSIUM CHLORIDE IN NACL 40-0.9 MEQ/L-% IV SOLN
INTRAVENOUS | Status: DC
  Administered 2018-10-20 – 2018-10-22 (×5): 100 mL/h via INTRAVENOUS
  Filled 2018-10-20 (×7): qty 1000

## 2018-10-20 MED ORDER — LORAZEPAM 2 MG/ML IJ SOLN
INTRAMUSCULAR | Status: AC
  Administered 2018-10-20: 1 mg via INTRAVENOUS
  Filled 2018-10-20: qty 1

## 2018-10-20 MED ORDER — INSULIN GLARGINE 100 UNIT/ML ~~LOC~~ SOLN
3.0000 [IU] | Freq: Every day | SUBCUTANEOUS | Status: DC
  Filled 2018-10-20: qty 0.03

## 2018-10-20 MED ORDER — CALCIUM GLUCONATE-NACL 1-0.675 GM/50ML-% IV SOLN
1.0000 g | Freq: Once | INTRAVENOUS | Status: DC
  Filled 2018-10-20: qty 50

## 2018-10-20 MED ORDER — LORAZEPAM 2 MG/ML IJ SOLN
1.0000 mg | Freq: Once | INTRAMUSCULAR | Status: AC
  Administered 2018-10-20: 1 mg via INTRAVENOUS

## 2018-10-20 MED ORDER — HALOPERIDOL LACTATE 5 MG/ML IJ SOLN
2.0000 mg | Freq: Four times a day (QID) | INTRAMUSCULAR | Status: DC | PRN
  Administered 2018-10-20: 2 mg via INTRAMUSCULAR
  Filled 2018-10-20: qty 1

## 2018-10-20 MED ORDER — POTASSIUM CHLORIDE 10 MEQ/100ML IV SOLN
10.0000 meq | INTRAVENOUS | Status: AC
  Administered 2018-10-20 (×2): 10 meq via INTRAVENOUS
  Filled 2018-10-20 (×2): qty 100

## 2018-10-20 MED ORDER — VANCOMYCIN HCL 10 G IV SOLR: 2000.0000 mg | INTRAVENOUS | Status: DC

## 2018-10-20 MED ORDER — VANCOMYCIN HCL 10 G IV SOLR
1500.0000 mg | INTRAVENOUS | Status: DC
  Administered 2018-10-21: 1500 mg via INTRAVENOUS
  Filled 2018-10-20: qty 1500

## 2018-10-20 NOTE — Progress Notes (Signed)
Hold Lovenox for possible LP. Use SCDs.

## 2018-10-20 NOTE — Progress Notes (Addendum)
Subjective:   Patient was seen laying in his bed with both daughters at bedside. Dr. Rory Percy accompanied team when we spoke with patient and family.   Daughters do confirm that patient has been having some memory issues over the past few months, they also report that he has had a left finger infection and had recently been treated with antibiotics.  Unsure if he has been taking his medications at home, his friend that lives with him will ask him if he took his medications and he would only say yes.  Patient with laying in bed sleeping.   Objective:  Vital signs in last 24 hours: Vitals:   10/19/18 2237 10/20/18 0009 10/20/18 0034 10/20/18 0434  BP:   (!) 152/73 (!) 156/81  Pulse:  (!) 113 (!) 105   Resp:  (!) 30  (!) 27  Temp:  (!) 103.7 F (39.8 C)  (!) 101.6 F (38.7 C)  TempSrc:  Rectal  Rectal  SpO2: 96% 93% 92%     General: Elderly male, no acute distress, laying in bed Cardiac: Tachycardic, regular rate no murmurs/rubs/gallops Pulmonary: Tachypneic, decreased breath sounds, wheezing diffusely Abdomen: Soft, mildly distended, hypoactive bowel sounds, still with some minimal groaning to palpation Extremity: No lower extremity edema, left fourth digit had thickened fingernail with yellowish discoloration Neuro: Sleeping, opens eyes and intermittently follows commands, able to move all extremities, PERRLA   Assessment/Plan:  Active Problems:   Encephalopathy acute  This is a 73 year old male with history of thyroid cancer status post resection and hypothyroidism, hypertension, diabetes mellitus, CKD stage II, COPD who presented after being found to have altered mental status and significantly elevated hyperglycemia at home. Noted to have a worsening headache and confusion for the past few months.   Acute encephalopathy:  Multifactorial etiology secondary to hyperglycemia, hyperthyroidism, hypothyroidism, hypertensive emergency, and possible underlying dementia in setting  of likely non-adherence to medications. Overnight patient spiked a fever to 103. Given his AMS and concerns for meningitis, given dexamethasone and started him on ampicillin, vancomycin, and ceftriaxone.  Patient does have multiple conditions that could be contributing to his altered mental status including hyperglycemia, hypothyroidism, and electrolyte abnormalities, however given the symptoms and fever we will treat empirically for meninginitis for now. Neurology will see but reported that we can also consider viral encephalitis, recommended adding acyclovir for now, continuing to treat his other issues, and obtaining the MRI to evaluate for PRES.   -F/u MRI, has haldol PRN  -F/u blood culture  -acyclovir to look for temporal lobe enhancement on mri, if there is none then discontinue. -Continue ceftriaxone, ampicillin, and vancomycin  -Obtain SLP -Neurology consulted, appreciate recommendations >> MRI showed no acute findings, recommend EEG and checking B12 levels, will consider LP  Either daughter can be contacted for healthcare decision making.   Hypertensive urgency: -Patient was started on labetalol as needed every 4 hours, he received 1 dose yesterday blood pressure has improved, most recent one was 158/81.  -Continue labetalol PRN -If passes speech can resume home lisinopril 20 mg daily  Hypothyrodism  Hx of thyroid cancer s/p radiation  Patients TSH 18.2, Free T4 0.6. He is on high doses of synthyoid at home, he is on 20 mcg daily with 1 day 400 mcg.  Since his TSH is still elevated he may have been noncompliant for a while, or he may not be on an adequate dose.  We will continue synthroid at home dose for now.  -Continue levothyroxine 100 mcg IV  Hyperosmolar hyperglycemia: Diabetes mellitus: On admission glucose >600, osm 300. A1c 11.5, on glimepiride and metformin at home. Has had some medication non-compliance which may be contributing to this or he may have had a recent  infection. Was placed on insulin drip and most recent CBGs were <250. Transition to SubQ lantus today. Has developed hypokalemia, most recent K was 3.0.   -Start lantus 10 units day -Transition off insulin drip -SSI q4 hr -LR with K at 150 cc/hr -q4 BMP   Electrolyte abnormalities: Patient has hypokalemia down to 3.0, mag 1.6, and calcium down to 5.3. Replete this overnight. Checked PTH and ionized calcium. Levels improved on follow up BMP. He was on the insulin drip which was likely contributing to the hypokalemia, switching off the drip and started on maintenance fluids with K.   -F/u PTH -Fu ionized calcium -Continue to monitor and replete as needed  COPD: -Patient still has some wheezing on exam, he also continues to be tachypneic.  Repeat chest x-ray showed no acute findings, some left atelectasis. -Continue duonebs PRN -Add nicotine patch  FEN: NS with K 100 cc/hr, replete lytes prn, NPO VTE ppx: Lovenox  Code Status: FULL   Dispo: Anticipated discharge is pending clinical improvement   Asencion Noble, MD 10/20/2018, 6:55 AM Pager: [phone]

## 2018-10-20 NOTE — Progress Notes (Signed)
Pt's glucose stabilizer unable to be adjust at 1345 d/t pt receiving a bedside lumbar punction. cbg taken and was 177.. pt already given 10 units of lantus an hour prior. Will adjust as needed & continue to monitor the pt. Hoover Brunette, RN

## 2018-10-20 NOTE — Progress Notes (Signed)
CRITICAL VALUE STICKER  CRITICAL VALUE: Spinal fluid white count 24  RECEIVER (on-site recipient of call): Forrestine Him    DATE & TIME NOTIFIED: 10/20/18 at 1607  MESSENGER (representative from lab):  MD NOTIFIED: Sherry Ruffing   TIME OF NOTIFICATION: 1613  RESPONSE: will notify neurology

## 2018-10-20 NOTE — Progress Notes (Signed)
EEG-normal  CSF results: 24 white cells with only 3 red cells-predominantly segmented neutrophils, mild elevated protein at 54, glucose 117.  Clinical picture could be consistent with a viral encephalitis.  Continue acyclovir until HSV PCR is resulted.  Can be discontinued if HSV PCR is negative. Continue antibiotics for now until Gram stain is negative.  Continued strong suspicion for this being multifactorial toxic metabolic encephalopathy due to hyperglycemia and hypothyroidism in the setting of noncompliance..  Amie Portland, MD [facility] Pager: [phone] If 7pm to 7am, please call on call as listed on AMION.

## 2018-10-20 NOTE — Progress Notes (Signed)
SLP Cancellation Note  Patient Details Name: Matthew [name] MRN: [id number] DOB: 30-Aug-1945   Cancelled treatment:       Reason Eval/Treat Not Completed: Medical issues which prohibited therapy. SLP attempted to see pt this afternoon but he was not able to sit up s/p LP yet. Discussed with RN and family - will f/u on next date as able.   Venita Sheffield Yago Ludvigsen 10/20/2018, 4:11 PM  Pollyann Glen, M.A. [facility] Acute Environmental education officer [phone] Office [phone]

## 2018-10-20 NOTE — Progress Notes (Signed)
EEG Complete  Results Pending 

## 2018-10-20 NOTE — Progress Notes (Signed)
SLP Cancellation Note  Patient Details Name: BOUBACAR LERETTE MRN: [id number] DOB: [birth year]   Cancelled treatment:       Reason Eval/Treat Not Completed: Patient at procedure or test/unavailable   Nataliee Shurtz, Katherene Ponto 10/20/2018, 11:20 AM

## 2018-10-20 NOTE — Progress Notes (Addendum)
Pt's 1200 cbg unable to be collected d/t pt being off the unit for a STAT MRI. Drip currently at 9.4 unitss/hr will continue to monitor pt & adjust when pt returns to the unit. Pt's cooling blanket was d/c'd around noon as well. Pt's temp was back within normal limits.  Hoover Brunette, RN

## 2018-10-20 NOTE — Progress Notes (Signed)
Pharmacy Antibiotic Note  Matthew [name] is a 73 y.o. male admitted on 10/19/2018 with sepsis.  Pharmacy has been consulted for vancomycin dosing.  Plan: Vancomycin 2gm IV q24 hours F/u renal function, cultures and clinical course    Temp (24hrs), Avg:100.1 F (37.8 C), Min:97.5 F (36.4 C), Max:103.7 F (39.8 C)  Recent Labs  Lab 10/19/18 1418 10/19/18 2148  WBC 10.2  --   CREATININE 1.16 1.26*    CrCl cannot be calculated (Unknown ideal weight.).    No Known Allergies   Thank you for allowing pharmacy to be a part of this patient's care.  Excell Seltzer Poteet 10/20/2018 12:41 AM

## 2018-10-20 NOTE — Progress Notes (Signed)
Pharmacy Antibiotic Note  Matthew [name] is a 73 y.o. male admitted on 10/19/2018 with sepsis.  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  Vancomycin started earlier this morning for sepsis.   Afebrile, T max 103.7   Plan: Acyclovir 920 mg IV q8h Adjust Vancomycin dose to Vancomycin 1500 mg IV Q 24 hrs. Goal AUC 400-550. Expected AUC:507.6 SCr used: 1.28 Vd used: 0.5 L/kg (BMI is 35)  Height: 5\' 11"  (180.3 cm) Weight: 258 lb (117 kg) IBW/kg (Calculated) : 75.3  Temp (24hrs), Avg:100 F (37.8 C), Min:97.5 F (36.4 C), Max:103.7 F (39.8 C)  Recent Labs  Lab 10/19/18 1418 10/19/18 2148 10/20/18 0252 10/20/18 0439  WBC 10.2  --   --   --   CREATININE 1.16 1.26* 0.95 1.28*    Estimated Creatinine Clearance: 67.9 mL/min (A) (by C-G formula based on SCr of 1.28 mg/dL (H)).    No Known Allergies  Antimicrobials this admission: Vancomycin 6/17>> Acyclovir 6/17>> Ampicillin 6/17>> Ceftriaxone 6/17>>  Dose adjustments this admission:   Microbiology results: 6/16 BCx: pending 6/16 Covid-19: sent  Thank you for allowing pharmacy to be a part of this patient's care.  Nicole Cella, RPh Clinical Pharmacist [phone] Pager: [phone] Please check AMION for all [facility] phone numbers After 10:00 PM, call [place] [phone] 10/20/2018 9:25 AM

## 2018-10-20 NOTE — Procedures (Addendum)
Indication: AMS, Meningitis/encephalitis  Risks of the procedure were dicussed with the patient's daughter Marquie Aderhold) including post-LP headache, bleeding, infection, weakness/numbness of legs(radiculopathy), death.   She agreed and written consent was obtained.   The patient was prepped and draped, and using sterile technique a 20 gauge quinke spinal needle was inserted in the L4/L5 space. Two attempts needed to get fluid return - second attempt by attending - with fluid return.   Approximately 8 cc of CSF were obtained and sent for analysis.   Minimal blood loss (~7XG)  No complication were encountered   Etta Quill PA-C [facility] [phone]  M-F  (9:00 am- 5:00 PM)  10/20/2018, 2:42 PM   Attending Neurohospitalist Addendum Patient seen and examined with APP/Resident. I performed the tap and collected ~8cc fluid. No complications. --- Amie Portland, MD [facility] Pager: [phone] If 7pm to 7am, please call on call as listed on AMION.

## 2018-10-20 NOTE — Progress Notes (Signed)
Neurology Consultation  Reason for Consult: Altered mental status, concern for CNS infection Referring Physician: Dr. Dareen Piano  CC: Altered mental status  History is obtained from: Patient, family  HPI: Matthew [name] is a 73 y.o. 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 His daughters check up on him but found him to be acting delirious and he complained that he is unable to see and he complained of difficulty walking which made them bring him to the hospital. According to the family he has been having bad headaches now for about 2 months that would be intermittent but his behavior and cognition seemed to be on a decline during that time.  Family also reports some episodes of looking blankly into space again all of this has been happening over the past 2 months. Primary medicine team has been evaluating him for causes of this encephalopathy and placed a neurological consultation after he spiked a fever yesterday without any obvious source as well as without any supporting leukocytosis etc. I was able to speak to 2 daughters who are at bedside who said that his progress to where he is has been gradually downward but last day or 2 he has been more confused than usual and more lethargic as well.  LKW: Unclear tpa given?: no, nonfocal exam, unclear last known normal Premorbid modified Rankin scale (mRS): 1-2  ROS: ROS was performed and is negative except as noted in the HPI.   Past Medical History:  Diagnosis Date  . Adenomatous polyps   . Benign neoplasm of colon   . Diabetes mellitus without complication ([place])   . External hemorrhoids   . Hemorrhoids   . Thyroid cancer ([place])      Family History  Problem Relation Age of Onset  . Heart attack Father   . Hypertension Father   .  Breast cancer Mother   . Hypertension Mother   . Hypertension Brother   . Colon cancer Neg Hx    Social History:   reports that he has been smoking cigarettes. He has been smoking about 0.50 packs per day. He has never used smokeless tobacco. He reports current alcohol use. He reports that he does not use drugs.  Medications  Current Facility-Administered Medications:  .  0.9 % NaCl with KCl 40 mEq / L  infusion, , Intravenous, Continuous, Chundi, Vahini, MD .  acetaminophen (TYLENOL) tablet 650 mg, 650 mg, Oral, Q6H PRN **OR** acetaminophen (TYLENOL) suppository 650 mg, 650 mg, Rectal, Q6H PRN, Alphonzo Grieve, MD, 650 mg at 10/19/18 2252 .  acyclovir (ZOVIRAX) 920 mg in dextrose 5 % 150 mL IVPB, 10 mg/kg (Adjusted), Intravenous, Q8H, Narendra, Nischal, MD .  ampicillin (OMNIPEN) 1 g in sodium chloride 0.9 % 100 mL IVPB, 1 g, Intravenous, Q6H, Seawell, Jaimie A, DO, Last Rate: 300 mL/hr at 10/20/18 0530, 1 g at 10/20/18 0530 .  cefTRIAXone (ROCEPHIN) 2 g in sodium chloride 0.9 % 100 mL IVPB, 2 g, Intravenous, Q12H, Seawell, Jaimie A, DO, Last Rate: 200 mL/hr at 10/20/18 0113, 2 g at 10/20/18 0113 .  dextrose 5 %-0.45 % sodium chloride infusion, , Intravenous, Continuous, Svalina, Gorica, MD, Last Rate: 100 mL/hr at 10/20/18 0341 .  dextrose 50 % solution 25 mL, 25 mL, Intravenous, PRN, Svalina, Gorica, MD .  enoxaparin (LOVENOX) injection 40 mg, 40 mg, Subcutaneous, Q24H, Svalina, Gorica,  MD, 40 mg at 10/19/18 2252 .  folic acid injection 1 mg, 1 mg, Intravenous, Daily, Svalina, Gorica, MD, 1 mg at 10/20/18 0031 .  haloperidol lactate (HALDOL) injection 2 mg, 2 mg, Intramuscular, Q6H PRN, Chundi, Vahini, MD .  insulin regular, human (MYXREDLIN) 100 units/ 100 mL infusion, , Intravenous, Continuous, Svalina, Gorica, MD, Last Rate: 10.6 mL/hr at 10/20/18 0921, 10.6 Units/hr at 10/20/18 0921 .  ipratropium-albuterol (DUONEB) 0.5-2.5 (3) MG/3ML nebulizer solution 3 mL, 3 mL, Nebulization, Q6H PRN,  Sherry Ruffing, Marissa M, MD .  ipratropium-albuterol (DUONEB) 0.5-2.5 (3) MG/3ML nebulizer solution 3 mL, 3 mL, Nebulization, TID, Dareen Piano, Nischal, MD, 3 mL at 10/20/18 0837 .  labetalol (NORMODYNE) injection 5 mg, 5 mg, Intravenous, Q2H PRN, Alphonzo Grieve, MD, 5 mg at 10/19/18 1946 .  levothyroxine (SYNTHROID, LEVOTHROID) injection 100 mcg, 100 mcg, Intravenous, Daily, Svalina, Gorica, MD .  polyethylene glycol (MIRALAX / GLYCOLAX) packet 17 g, 17 g, Oral, Daily PRN, Alphonzo Grieve, MD .  promethazine (PHENERGAN) tablet 12.5 mg, 12.5 mg, Oral, Q6H PRN, Alphonzo Grieve, MD .  thiamine (B-1) injection 100 mg, 100 mg, Intravenous, Daily, Alphonzo Grieve, MD .  Derrill Memo ON 10/21/2018] vancomycin (VANCOCIN) 1,500 mg in sodium chloride 0.9 % 500 mL IVPB, 1,500 mg, Intravenous, Q24H, Dareen Piano, Nischal, MD  Exam: Current vital signs: BP (!) 143/62   Pulse 88   Temp 98.4 F (36.9 C) (Axillary)   Resp (!) 22   Ht 5\' 11"  (1.803 m)   Wt 117 kg   SpO2 94%   BMI 35.98 kg/m  Vital signs in last 24 hours: Temp:  [97.5 F (36.4 C)-103.7 F (39.8 C)] 98.4 F (36.9 C) (06/17 0743) Pulse Rate:  [88-130] 88 (06/17 0837) Resp:  [15-31] 22 (06/17 0837) BP: (143-220)/(62-110) 143/62 (06/17 0743) SpO2:  [91 %-99 %] 94 % (06/17 0837) Weight:  [324 kg] 117 kg (06/17 0919) General: Patient was sleepy, opens eyes to voice, follows commands. HEENT: Normocephalic atraumatic dry mucous membranes Cardiovascular: Regular rate rhythm Respiration: Breathing normally and saturating normally on room air. Extremities: Warm well perfused Neurological exam Patient was sleepy, opens eyes to voice, follows all commands. Initially he refused to identify his daughter but later on on coaxing, he was able to identify both his daughters by the correct names. His speech is mildly dysarthric. He has extremely poor attention concentration. Naming comprehension and repetition are preserved. Cranial: Pupils equal round react  light, extraocular movement intact, visual fields full, face is symmetric, facial sensation intact, auditory acuity intact, tongue midline, palate midline. Motor exam: He is antigravity 5/5 in all 4 extremities without drift. Sensory exam: Intact light touch all over Coordination: Did not perform finger-nose-finger testing Gait testing was deferred at this time.   Labs I have reviewed labs in epic and the results pertinent to this consultation are:  CBC    Component Value Date/Time   WBC 10.2 10/19/2018 1418   RBC 5.29 10/19/2018 1418   HGB 16.7 10/19/2018 1418   HCT 47.9 10/19/2018 1418   PLT 379 10/19/2018 1418   MCV 90.5 10/19/2018 1418   MCH 31.6 10/19/2018 1418   MCHC 34.9 10/19/2018 1418   RDW 13.9 10/19/2018 1418   LYMPHSABS 2.5 10/19/2018 1418   MONOABS 0.6 10/19/2018 1418   EOSABS 0.1 10/19/2018 1418   BASOSABS 0.1 10/19/2018 1418    CMP     Component Value Date/Time   NA 135 10/20/2018 0831   K 3.2 (L) 10/20/2018 0831   CL 104 10/20/2018 0831  CO2 18 (L) 10/20/2018 0831   GLUCOSE 210 (H) 10/20/2018 0831   BUN 16 10/20/2018 0831   CREATININE 1.20 10/20/2018 0831   CALCIUM 8.2 (L) 10/20/2018 0831   PROT 7.0 10/19/2018 1418   ALBUMIN 3.2 (L) 10/19/2018 1418   AST 21 10/19/2018 1418   ALT 32 10/19/2018 1418   ALKPHOS 143 (H) 10/19/2018 1418   BILITOT 0.7 10/19/2018 1418   GFRNONAA >60 10/20/2018 0831   GFRAA >60 10/20/2018 0831  Urinalysis shows ketonuria, glucosuria TSH: 18.25.  Free T4 0.60, A1c-11.5  On arrival had hyponatremia sodium 127, potassium 5.2, glucose ranging from 400s to 600s.  Imaging I have reviewed the images obtained: CT-scan of the brain - no acute changes MRI of the brain with no acute abnormality.  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  No abnormal temporal lobe signal abnormality.  Assessment: 73 year old man with past medical history of thyroid cancer, uncontrolled diabetes, CKD, hypertension, noncompliance to  medications brought in for evaluation of altered mental status which is acute on chronic worsening.  Has been having headaches and behavioral changes/cognitive changes for the past 2 months.  On arrival, vitals were stable, sugar was extremely elevated, TSH extremely elevated.  He spiked a fever after admission which prompted a neurological consultation for possibility of a CNS infection due to given confusion and headaches.  Also has uncontrolled diabetes as well as uncontrolled hypothyroidism, which can cause multifactorial toxic metabolic encephalopathy.  I do not strongly suspect a CNS infection because his confusion and headaches have been a chronic phenomenology, no white count, no neck stiffness.  Viral encephalitis still could be a possibility.  Given history of cancer I do think looking for carcinomatous meningitis along with CNS infection might be prudent if imaging and other testing reveal no obvious abnormalities or explanations.  An EEG should also be considered to rule out seizures.  Impression: Evaluate for multifactorial toxic metabolic encephalopathy Low suspicion for CNS infection but will consider if all other work-up remains negative. Hyperglycemia with ketonuria Severe hypothyroidism  Recommendations: -I recommended getting an MRI-that has been done and is unremarkable for acute process. -I would recommend getting an EEG. -Check vitamin B12 levels, he is already received B1 supplementation, so that level checking would not be of help. -Check RPR -Continue on meningitic/encephalitic coverage for now although my suspicion for CNS infection is low. If all work up continues to be of no or limited value, will consider LP. -Treat hyperglycemia and hypothyroidism aggressively per primary team. -Discussed plan with team and Dr. Dareen Piano at bedside.   -- Amie Portland, MD [facility] Pager: [phone] If 7pm to 7am, please call on call as listed on AMION.

## 2018-10-20 NOTE — Procedures (Signed)
[place] A. Merlene Laughter, MD     [email or web address]           HISTORY: This is a 73 year old male who presents with fevers and unresponsiveness.  This study is being done to evaluate for nonconvulsive seizures.  MEDICATIONS:  Current Facility-Administered Medications:  .  0.9 % NaCl with KCl 40 mEq / L  infusion, , Intravenous, Continuous, Chundi, Vahini, MD, Last Rate: 100 mL/hr at 10/20/18 1043, 100 mL/hr at 10/20/18 1043 .  acetaminophen (TYLENOL) tablet 650 mg, 650 mg, Oral, Q6H PRN **OR** acetaminophen (TYLENOL) suppository 650 mg, 650 mg, Rectal, Q6H PRN, Alphonzo Grieve, MD, 650 mg at 10/19/18 2252 .  acyclovir (ZOVIRAX) 920 mg in dextrose 5 % 150 mL IVPB, 10 mg/kg (Adjusted), Intravenous, Q8H, Narendra, Nischal, MD, Last Rate: 168.4 mL/hr at 10/20/18 1328, 920 mg at 10/20/18 1328 .  ampicillin (OMNIPEN) 1 g in sodium chloride 0.9 % 100 mL IVPB, 1 g, Intravenous, Q6H, Seawell, Jaimie A, DO, Last Rate: 300 mL/hr at 10/20/18 1300, 1 g at 10/20/18 1300 .  cefTRIAXone (ROCEPHIN) 2 g in sodium chloride 0.9 % 100 mL IVPB, 2 g, Intravenous, Q12H, Seawell, Jaimie A, DO, Last Rate: 200 mL/hr at 10/20/18 1619, 2 g at 10/20/18 1619 .  dextrose 5 %-0.45 % sodium chloride infusion, , Intravenous, Continuous, Svalina, Gorica, MD, Last Rate: 100 mL/hr at 10/20/18 0341 .  dextrose 50 % solution 25 mL, 25 mL, Intravenous, PRN, Jari Favre, Gorica, MD .  folic acid injection 1 mg, 1 mg, Intravenous, Daily, Svalina, Gorica, MD, 1 mg at 10/20/18 1050 .  haloperidol lactate (HALDOL) injection 2 mg, 2 mg, Intramuscular, Q6H PRN, Chundi, Vahini, MD, 2 mg at 10/20/18 1057 .  insulin aspart (novoLOG) injection 0-15 Units, 0-15 Units, Subcutaneous, Q4H, Chundi, Vahini, MD .  insulin glargine (LANTUS) injection 10 Units, 10 Units, Subcutaneous, Daily, Asencion Noble, MD, 10 Units at 10/20/18 1247 .  ipratropium-albuterol (DUONEB) 0.5-2.5 (3) MG/3ML nebulizer solution 3 mL, 3 mL,  Nebulization, Q6H PRN, Sherry Ruffing, Marissa M, MD .  ipratropium-albuterol (DUONEB) 0.5-2.5 (3) MG/3ML nebulizer solution 3 mL, 3 mL, Nebulization, BID, Narendra, Nischal, MD .  labetalol (NORMODYNE) injection 5 mg, 5 mg, Intravenous, Q2H PRN, Alphonzo Grieve, MD, 5 mg at 10/19/18 1946 .  levothyroxine (SYNTHROID, LEVOTHROID) injection 100 mcg, 100 mcg, Intravenous, Daily, Svalina, Gorica, MD, 100 mcg at 10/20/18 1420 .  nicotine (NICODERM CQ - dosed in mg/24 hours) patch 21 mg, 21 mg, Transdermal, Daily, Sherry Ruffing, Marissa M, MD .  polyethylene glycol (MIRALAX / GLYCOLAX) packet 17 g, 17 g, Oral, Daily PRN, Alphonzo Grieve, MD .  promethazine (PHENERGAN) tablet 12.5 mg, 12.5 mg, Oral, Q6H PRN, Jari Favre, Gorica, MD .  thiamine (B-1) injection 100 mg, 100 mg, Intravenous, Daily, Svalina, Gorica, MD, 100 mg at 10/20/18 1050 .  [START ON 10/21/2018] vancomycin (VANCOCIN) 1,500 mg in sodium chloride 0.9 % 500 mL IVPB, 1,500 mg, Intravenous, Q24H, Narendra, Nischal, MD     ANALYSIS: A 16 channel recording using standard 10 20 measurements is conducted for 23 minutes.   There is a low-voltage background activity of 12 hertz intermixed with a higher frequency 20 hertz rhythm.  There is typical of beta activity observed in frontal areas.  Awake and drowsy architecture are observed.   There is somewhat increased myogenic artifact noted. Photic stimulation is carried out without abnormal changes in the background activity.  There is no focal or lateralized slowing.  There is no epileptiform activity is noted.   IMPRESSION: 1.  This is a normal recording of the awake and drowsy states.      Caide Campi A. Merlene Laughter, M.D.  Diplomate, Tax adviser of Psychiatry and Neurology ( Neurology).

## 2018-10-21 DIAGNOSIS — E1100 Type 2 diabetes mellitus with hyperosmolarity without nonketotic hyperglycemic-hyperosmolar coma (NKHHC): Secondary | ICD-10-CM

## 2018-10-21 DIAGNOSIS — E878 Other disorders of electrolyte and fluid balance, not elsewhere classified: Secondary | ICD-10-CM

## 2018-10-21 LAB — GLUCOSE, CAPILLARY
Glucose-Capillary: 174 mg/dL — ABNORMAL HIGH (ref 70–99)
Glucose-Capillary: 177 mg/dL — ABNORMAL HIGH (ref 70–99)
Glucose-Capillary: 225 mg/dL — ABNORMAL HIGH (ref 70–99)
Glucose-Capillary: 246 mg/dL — ABNORMAL HIGH (ref 70–99)
Glucose-Capillary: 277 mg/dL — ABNORMAL HIGH (ref 70–99)
Glucose-Capillary: 293 mg/dL — ABNORMAL HIGH (ref 70–99)
Glucose-Capillary: 294 mg/dL — ABNORMAL HIGH (ref 70–99)

## 2018-10-21 LAB — CBC
HCT: 44.8 % (ref 39.0–52.0)
Hemoglobin: 15.6 g/dL (ref 13.0–17.0)
MCH: 31.6 pg (ref 26.0–34.0)
MCHC: 34.8 g/dL (ref 30.0–36.0)
MCV: 90.9 fL (ref 80.0–100.0)
Platelets: 328 10*3/uL (ref 150–400)
RBC: 4.93 MIL/uL (ref 4.22–5.81)
RDW: 14.4 % (ref 11.5–15.5)
WBC: 18.9 10*3/uL — ABNORMAL HIGH (ref 4.0–10.5)
nRBC: 0 % (ref 0.0–0.2)

## 2018-10-21 LAB — BASIC METABOLIC PANEL
Anion gap: 10 (ref 5–15)
BUN: 19 mg/dL (ref 8–23)
CO2: 20 mmol/L — ABNORMAL LOW (ref 22–32)
Calcium: 7.5 mg/dL — ABNORMAL LOW (ref 8.9–10.3)
Chloride: 107 mmol/L (ref 98–111)
Creatinine, Ser: 1.12 mg/dL (ref 0.61–1.24)
GFR calc Af Amer: >60 mL/min (ref >60)
GFR calc non Af Amer: >60 mL/min (ref >60)
Glucose, Bld: 241 mg/dL — ABNORMAL HIGH (ref 70–99)
Potassium: 3.7 mmol/L (ref 3.5–5.1)
Sodium: 137 mmol/L (ref 135–145)

## 2018-10-21 LAB — RPR: RPR Ser Ql: REACTIVE — AB

## 2018-10-21 LAB — VITAMIN D 25 HYDROXY (VIT D DEFICIENCY, FRACTURES): Vit D, 25-Hydroxy: 10.3 ng/mL — ABNORMAL LOW (ref 30.0–100.0)

## 2018-10-21 LAB — RPR, QUANT+TP ABS (REFLEX)
Rapid Plasma Reagin, Quant: 1:1 {titer} — ABNORMAL HIGH
T Pallidum Abs: NONREACTIVE

## 2018-10-21 LAB — PARATHYROID HORMONE, INTACT (NO CA): PTH: 13 pg/mL — ABNORMAL LOW (ref 15–65)

## 2018-10-21 MED ORDER — PHENOL 1.4 % MT LIQD: 1.0000 | OROMUCOSAL | Status: DC | PRN

## 2018-10-21 MED ORDER — INSULIN GLARGINE 100 UNIT/ML ~~LOC~~ SOLN
15.0000 [IU] | Freq: Every day | SUBCUTANEOUS | Status: DC
  Filled 2018-10-21: qty 0.15

## 2018-10-21 MED ORDER — LEVOTHYROXINE SODIUM 100 MCG PO TABS
200.0000 ug | ORAL_TABLET | Freq: Every day | ORAL | Status: DC
  Administered 2018-10-22 – 2018-10-24 (×3): 200 ug via ORAL
  Filled 2018-10-21 (×3): qty 2

## 2018-10-21 MED ORDER — LISINOPRIL 20 MG PO TABS
20.0000 mg | ORAL_TABLET | Freq: Every day | ORAL | Status: DC
  Administered 2018-10-21: 20 mg via ORAL
  Filled 2018-10-21: qty 1

## 2018-10-21 NOTE — Progress Notes (Signed)
Subjective: Patient reported that he was doing well today, reported that he wanted to go home.  He does state that he does not have any recollection of the past 2 days, denies any chest pain, shortness of breath, headaches, or week.  Denies any other issues today. We  Objective:  Vital signs in last 24 hours: Vitals:   10/20/18 2050 10/21/18 0033 10/21/18 0441 10/21/18 0500  BP:  (!) 152/81    Pulse:  98    Resp:  19    Temp:  97.7 F (36.5 C) 97.8 F (36.6 C)   TempSrc:  Oral Oral   SpO2: 95% 95%  93%  Weight:      Height:        General: Elderly male, sitting up in bed, NAD Cardiac: RRR, no m/r/g Pulmonary: CTABL, no wheezing, rhonchi, rales Abdomen: Soft, non-tender, minimally distended, normoactive bowel sounds Neuro: Awake, alert, CN 2-12 grossly intact, 5/5 strength, sensation to light touch intact in upper and lower extremities     Assessment/Plan:  Active Problems:   Encephalopathy acute   [name] with history of thyroid cancer status post resection and hypothyroidism, hypertension, diabetes mellitus, CKD stage II, COPD who presented after being found to have altered mental status and significantly elevated hyperglycemia at home. Noted to have a worsening headache and confusion for the past few months.   Acute encephalopathy Differential includes viral encephalitis, HHS, hypothyroidism, or electrolyte abnormalities. CT head was negative.  MRI showed no acute findings. EEG was unremarkable. Neurology obtained lumbar puncture that showed protein 52, glucose 117, WBC 24, some evidence of viral infection. Continued on acyclovir and discontinued his ceftriaxone, ampicillin and vancomycin.  He had improved his blood sugars and electrolyte abnormalities, today he has significantly improved and is a lot more alert, no focal neurological deficits noted on exam.  Given the significant improvement this may have been due to metabolic encephalopathy due to HHS  and hypothyroidism.  -Follow-up CSF HSV, neurology added paraneoplastic panel due to history of thyroid cancer -Continue acyclovir per pharmacy -Monitor CBC and BMP -Continue to treat thyroid disease and diabetes -PT/OT >> recommends CIR vs max HH services -CIR consult  HHS Diabetes mellitus -On glimepiride and metformin at home.  A1c was up to 11.  Likely due to medication noncompliance.  Patient is receiving insulin 10 units daily and sliding scale insulin.  CBGs have ranged 174-293. He received decadron yesterday which likely increased his blood sugars. Will increase lantus today.  Patient may need to have his home regimen adjusted,insulin may not be the best option given his noncompliance but given his HHS He will likely benefit from this.  Will need to ensure home health services to make sure that he is taking his medications as prescribed at discharge.  -Increase lantus to 15 units -Continue SSI -Frequent CBGs -Diabetes coordinator following, appreciate recommendations, appreciate assistance with insulin education for discharge  Hypertensive urgency: -Blood pressure elevated to 200, patient was restarted on home lisinopril and most recent blood pressure was well controlled at 152/81.  -Continue lisinopril 10 mg daily  Hypothyroidism: History of thyroid cancer status post radiation therapy: TSH 18.2, free T4 0.6.  Likely due to noncompliance of his synthroid for a while.  -Switch IV synthroid 100 mcg to PO synthroid 200 mcg, unsure what day he needs to take 400 mcg, will need to clarify with patient   Electrolyte abnormalities: K improved to 3.7 today, calcium still low at 7.5. PTH low at  22. 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  F/u ionized calcium.   -On NS with K at 100 cc/hr -Continue to monitor and replete as needed  COPD: No wheezing on exam, normal work of breathing, resting comfortably  -Continue duonebs PRN -Continue nicotine  patch  FEN: NS with K at 100 cc/hr, replete lytes prn, dysphagia diet VTE ppx: Lovenox  Code Status: FULL   Dispo: Anticipated discharge in approximately 1-2 day(s).   Asencion Noble, MD 10/21/2018, 6:49 AM Pager: [phone]

## 2018-10-21 NOTE — Plan of Care (Signed)
  Problem: Health Behavior/Discharge Planning: Goal: Ability to manage health-related needs will improve Outcome: Not Progressing  Patient continues to ask to be discharged. Updated and education provided.

## 2018-10-21 NOTE — Progress Notes (Addendum)
Added paraneoplastic panel to the CSF tests due to h/o thyroid cancer.  Awaiting CSF HSV PCR. Continue acyclovir till negative.  CSF picture and gram stain not suggestive of bacterial infection- can d/c meningitic doses antibiotics.  I will follow after HSV PCR becomes available.   -- Amie Portland, MD [facility] Pager: [phone] If 7pm to 7am, please call on call as listed on AMION.

## 2018-10-21 NOTE — Evaluation (Signed)
Physical Therapy Evaluation Patient Details Name: Matthew [name] MRN: [id number] DOB: 03-Mar-1946 Today's Date: 10/21/2018   History of Present Illness  Pt is a 73 y/o male admitted secondary to AMS. MRI negative for acute abnormality. Pt with encephalopathy, however, workup still pending. Pt is s/p lumbar puncture. PMH includes CKD, thyroid cancer, COPD, HTN, and DM.   Clinical Impression  Pt admitted secondary to problem above with deficits below. Pt with notable difficulties sequencing during gait and required min A for steadying assist with use of RW. Pt also becoming SOB, however, oxygen sats WFL on RA. Educated pt and daughter about current deficits and recommendations for CIR level therapies, however, pt becoming agitated and stated "I'm going home today!" Feel pt is at increased risk for falls and will require increased assist at d/c. Educated pt's daughter about DME recommendations and need for increased assist. Will continue to follow acutely to maximize functional mobility independence and safety.     Follow Up Recommendations CIR;Supervision/Assistance - 24 hour(Pt will likely refuse. Will need max HH services if refuses)    Equipment Recommendations  Rolling walker with 5" wheels;3in1 (PT)    Recommendations for Other Services       Precautions / Restrictions Precautions Precautions: Fall Restrictions Weight Bearing Restrictions: No      Mobility  Bed Mobility Overal bed mobility: Needs Assistance Bed Mobility: Supine to Sit     Supine to sit: Mod assist     General bed mobility comments: Mod A for trunk elevation and LE assist.   Transfers Overall transfer level: Needs assistance Equipment used: Rolling walker (2 wheeled) Transfers: Sit to/from Stand Sit to Stand: Min assist         General transfer comment: Min A for lift assist and steadying. Cues for hand placement.   Ambulation/Gait Ambulation/Gait assistance: Min assist Gait Distance (Feet): 50  Feet Assistive device: Rolling walker (2 wheeled) Gait Pattern/deviations: Step-through pattern;Shuffle;Decreased step length - right;Decreased step length - left;Trunk flexed Gait velocity: Decreased   General Gait Details: Pt with difficulty sequencing during gait. When attempting to initiate gait, pt just stepping in place. Required cues to take steps forwards. Also required cues to take longer steps as pt taking very short guarded steps. Required min A for steadying assist. Pt requiring multimodal cues for sequencing using RW.   Stairs            Wheelchair Mobility    Modified Rankin (Stroke Patients Only)       Balance Overall balance assessment: Needs assistance Sitting-balance support: No upper extremity supported;Feet supported Sitting balance-Leahy Scale: Fair     Standing balance support: Bilateral upper extremity supported;During functional activity Standing balance-Leahy Scale: Poor Standing balance comment: Reliant on BUE support                              Pertinent Vitals/Pain      Home Living Family/patient expects to be discharged to:: Private residence Living Arrangements: Spouse/significant other Available Help at Discharge: Family;Available 24 hours/day Type of Home: House Home Access: Stairs to enter Entrance Stairs-Rails: None Entrance Stairs-Number of Steps: 3 Home Layout: One level Home Equipment: None      Prior Function Level of Independence: Independent               Hand Dominance        Extremity/Trunk Assessment   Upper Extremity Assessment Upper Extremity Assessment: Defer to OT evaluation  Lower Extremity Assessment Lower Extremity Assessment: Generalized weakness    Cervical / Trunk Assessment Cervical / Trunk Assessment: Normal  Communication   Communication: No difficulties  Cognition Arousal/Alertness: Awake/alert Behavior During Therapy: WFL for tasks assessed/performed;Agitated Overall  Cognitive Status: Impaired/Different from baseline Area of Impairment: Memory;Orientation;Following commands;Safety/judgement;Problem solving                 Orientation Level: Disoriented to;Situation;Time   Memory: Decreased short-term memory;Decreased recall of precautions Following Commands: Follows one step commands with increased time Safety/Judgement: Decreased awareness of safety;Decreased awareness of deficits   Problem Solving: Slow processing;Requires verbal cues;Requires tactile cues General Comments: Initially reporting it was April, and then MAy, and then was able to state it was June. Pt unsure of why he was in the hospital. Pt initially very pleasant, however, at end of session, pt becoming slightly agitated and reports he was going home today. RN in room and calling MD. Pt very unaware of current deficits and was not open to education about deficits.       General Comments General comments (skin integrity, edema, etc.): Pt's HR elevated to mid 120s during gait. Pt with SOB, however, oxygen sats WFL on RA>     Exercises     Assessment/Plan    PT Assessment Patient needs continued PT services  PT Problem List Decreased strength;Decreased balance;Decreased activity tolerance;Decreased mobility;Decreased cognition;Decreased knowledge of use of DME;Decreased safety awareness;Decreased knowledge of precautions       PT Treatment Interventions DME instruction;Gait training;Stair training;Therapeutic exercise;Therapeutic activities;Functional mobility training;Balance training;Patient/family education    PT Goals (Current goals can be found in the Care Plan section)  Acute Rehab PT Goals Patient Stated Goal: "to go home today"  PT Goal Formulation: With patient Time For Goal Achievement: 11/04/18 Potential to Achieve Goals: Good    Frequency Min 3X/week   Barriers to discharge        Co-evaluation               AM-PAC PT "6 Clicks" Mobility  Outcome  Measure Help needed turning from your back to your side while in a flat bed without using bedrails?: A Lot Help needed moving from lying on your back to sitting on the side of a flat bed without using bedrails?: A Lot Help needed moving to and from a bed to a chair (including a wheelchair)?: A Little Help needed standing up from a chair using your arms (e.g., wheelchair or bedside chair)?: A Little Help needed to walk in hospital room?: A Little Help needed climbing 3-5 steps with a railing? : A Lot 6 Click Score: 15    End of Session Equipment Utilized During Treatment: Gait belt Activity Tolerance: Patient limited by fatigue Patient left: in bed;with call bell/phone within reach;with bed alarm set;with family/visitor present(sitting EOB with daughter present; RN cleared) Nurse Communication: Mobility status PT Visit Diagnosis: Unsteadiness on feet (R26.81);Muscle weakness (generalized) (M62.81);Difficulty in walking, not elsewhere classified (R26.2);Other symptoms and signs involving the nervous system (R29.898)    Time: 3474-2595 PT Time Calculation (min) (ACUTE ONLY): 33 min   Charges:   PT Evaluation $PT Eval Moderate Complexity: 1 Mod PT Treatments $Gait Training: 8-22 mins        Leighton Ruff, PT, DPT  Acute Rehabilitation Services  Pager: [phone] Office: [phone]   Rudean Hitt 10/21/2018, 2:20 PM

## 2018-10-21 NOTE — Progress Notes (Signed)
Inpatient Diabetes Program Recommendations  AACE/ADA: New Consensus Statement on Inpatient Glycemic Control (2015)  Target Ranges:  Prepandial:   less than 140 mg/dL      Peak postprandial:   less than 180 mg/dL (1-2 hours)      Critically ill patients:  140 - 180 mg/dL   Lab Results  Component Value Date   GLUCAP 246 (H) 10/21/2018   HGBA1C 11.5 (H) 10/20/2018    Review of Glycemic Control  Diabetes history: DM 2 Outpatient Diabetes medications: Metformin 1000 mg bid, Amaryl 4 mg Daily Current orders for Inpatient glycemic control: Lantus 10 units, Novolog 0-15 units Q4 hours.  Inpatient Diabetes Program Recommendations:    Patient received Decadron 10 mg x 2 doses yesterday. Fasting glucose in the 240's. Consider one time dose of Lantus 10 more units today. Glucose trends should decrease within 24-48 hours.  A1c 11.5%. Will round on patient possibly 10/22/18 to discuss glucose control at home and possibly insulin with patient and daughters if present and patient is appropriate.  Thanks,  Tama Headings RN, MSN, BC-ADM Inpatient Diabetes Coordinator Team Pager [phone] (8a-5p)

## 2018-10-21 NOTE — Evaluation (Signed)
Clinical/Bedside Swallow Evaluation Patient Details  Name: BRAXTIN BAMBA MRN: [id number] Date of Birth: [birth year]  Today's Date: 10/21/2018 Time: SLP Start Time (ACUTE ONLY): 3235 SLP Stop Time (ACUTE ONLY): 0917 SLP Time Calculation (min) (ACUTE ONLY): 13 min  Past Medical History:  Past Medical History:  Diagnosis Date  . Adenomatous polyps   . Benign neoplasm of colon   . Diabetes mellitus without complication ([place])   . External hemorrhoids   . Hemorrhoids   . Thyroid cancer [facility])    Past Surgical History: History reviewed. No pertinent surgical history. HPI:  This is a 73 year old male with history of thyroid cancer status post resection and hypothyroidism, hypertension, diabetes mellitus, CKD stage II, COPD who presented after being found to have altered mental status and significantly elevated hyperglycemia at home, etiology not yet determined. Per neurology note, continued strong suspicion for this being multifactorial toxic metabolic encephalopathy due to hyperglycemia and hypothyroidism in the setting of noncompliance. 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    Assessment / Plan / Recommendation Clinical Impression  Pt could not complete 3 oz water test, stopped 3/4 to breathe then produced a mild cough with dyspnea. No s/s aspiration with smaller single sips via cup and straw. Currently endentulous with dentures at home stating he wears them "sometimes" to eat. Given SOB will initiate Dys 3 (like mech soft), thin liquids, pills with thin and educated to take rest breaks and sit upright during meals. Plan to follow short term while on acute venue.   SLP Visit Diagnosis: Dysphagia, unspecified (R13.10)    Aspiration Risk  Mild aspiration risk    Diet Recommendation Dysphagia 3 (Mech soft);Thin liquid   Liquid Administration via: Cup;Straw Medication Administration: Whole  meds with liquid Supervision: Patient able to self feed Compensations: Slow rate;Small sips/bites;Other (Comment)(rest breaks) Postural Changes: Seated upright at 90 degrees    Other  Recommendations Oral Care Recommendations: Oral care BID   Follow up Recommendations None      Frequency and Duration min 2x/week  2 weeks       Prognosis Prognosis for Safe Diet Advancement: Good      Swallow Study   General HPI: This is a 73 year old male with history of thyroid cancer status post resection and hypothyroidism, hypertension, diabetes mellitus, CKD stage II, COPD who presented after being found to have altered mental status and significantly elevated hyperglycemia at home, etiology not yet determined. Per neurology note, continued strong suspicion for this being multifactorial toxic metabolic encephalopathy due to hyperglycemia and hypothyroidism in the setting of noncompliance. 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  Type of Study: Bedside Swallow Evaluation Previous Swallow Assessment: none Diet Prior to this Study: NPO Temperature Spikes Noted: No Respiratory Status: Nasal cannula History of Recent Intubation: No Behavior/Cognition: Alert;Cooperative;Pleasant mood;Requires cueing Oral Cavity Assessment: Within Functional Limits Oral Care Completed by SLP: No Oral Cavity - Dentition: Edentulous;Dentures, not available Vision: Functional for self-feeding Self-Feeding Abilities: Able to feed self Patient Positioning: Upright in bed Baseline Vocal Quality: Normal Volitional Cough: Strong Volitional Swallow: Able to elicit    Oral/Motor/Sensory Function Overall Oral Motor/Sensory Function: Within functional limits   Ice Chips Ice chips: Not tested   Thin Liquid Thin Liquid: Impaired Presentation: Cup;Straw Pharyngeal  Phase Impairments: Cough - Immediate    Nectar Thick Nectar  Thick Liquid: Not tested   Honey  Thick Honey Thick Liquid: Not tested   Puree Puree: Within functional limits   Solid     Solid: Within functional limits      Houston Siren 10/21/2018,9:35 AM  Orbie Pyo Colvin Caroli.Ed Risk analyst [phone] Office [phone]

## 2018-10-21 NOTE — Progress Notes (Signed)
Daughter reported that patient wanted something for headache.  RN went to pull tylenol for headache.  Just then daughter used the call light and on arriving at bedside patient was sitting on the side of the bed insisting that he wants to go home.  The charge nurse, another RN and patient RN were at bedside talking with patient and explaining the plan of care but patient continue to say that he is going home tonight.  He stated that if we did not let him leave he was going to sue [place].  He became boisterous pushing [place] and became aggressive.  Security was called. Daughter called and was updated and on her way .  Dr. Sharon Seller is notified.  Will continue to monitor.

## 2018-10-21 NOTE — Progress Notes (Signed)
Pulled tylenol to give patient for complaint of headache.  On arrival patient was standing wanting to go home.  Tylenol was offered but he refused. Because he was agitated, we tried to calm him down and when we finally got him settled he stated that his headache is gone. Another daughter arrived at bedside .  He is calm at the moment.  Will continue to monitor.

## 2018-10-21 NOTE — Progress Notes (Addendum)
Patient threatening to leave hospital again. Did the same this AM. MD team saw patient and he agreed to stay. Patient now states he will not stay and will pull out remaining IV. Page to MD.  1400: Daughter remains at bedside. Constantly updated on condition.  1935: Call to on call MD. Patient BP elevated. MD to look at chart.

## 2018-10-21 NOTE — Progress Notes (Signed)
Patient finally allowed [place] to touch him.  He was placed on bed.  Security arrived and left.  The other daughter arrived to replace the daughter that was at bedside.  He is in bed resting.  Will continue to monitor.

## 2018-10-22 LAB — GLUCOSE, CAPILLARY
Glucose-Capillary: 178 mg/dL — ABNORMAL HIGH (ref 70–99)
Glucose-Capillary: 196 mg/dL — ABNORMAL HIGH (ref 70–99)
Glucose-Capillary: 226 mg/dL — ABNORMAL HIGH (ref 70–99)
Glucose-Capillary: 235 mg/dL — ABNORMAL HIGH (ref 70–99)
Glucose-Capillary: 237 mg/dL — ABNORMAL HIGH (ref 70–99)
Glucose-Capillary: 250 mg/dL — ABNORMAL HIGH (ref 70–99)

## 2018-10-22 LAB — HSV DNA BY PCR (REFERENCE LAB)
HSV 1 DNA: NEGATIVE
HSV 2 DNA: NEGATIVE

## 2018-10-22 LAB — CBC
HCT: 44.2 % (ref 39.0–52.0)
Hemoglobin: 15.6 g/dL (ref 13.0–17.0)
MCH: 31.7 pg (ref 26.0–34.0)
MCHC: 35.3 g/dL (ref 30.0–36.0)
MCV: 89.8 fL (ref 80.0–100.0)
Platelets: 349 10*3/uL (ref 150–400)
RBC: 4.92 MIL/uL (ref 4.22–5.81)
RDW: 14.3 % (ref 11.5–15.5)
WBC: 13.9 10*3/uL — ABNORMAL HIGH (ref 4.0–10.5)
nRBC: 0 % (ref 0.0–0.2)

## 2018-10-22 LAB — BASIC METABOLIC PANEL
Anion gap: 10 (ref 5–15)
BUN: 11 mg/dL (ref 8–23)
CO2: 21 mmol/L — ABNORMAL LOW (ref 22–32)
Calcium: 7.3 mg/dL — ABNORMAL LOW (ref 8.9–10.3)
Chloride: 107 mmol/L (ref 98–111)
Creatinine, Ser: 1.05 mg/dL (ref 0.61–1.24)
GFR calc Af Amer: >60 mL/min (ref >60)
GFR calc non Af Amer: >60 mL/min (ref >60)
Glucose, Bld: 201 mg/dL — ABNORMAL HIGH (ref 70–99)
Potassium: 3.5 mmol/L (ref 3.5–5.1)
Sodium: 138 mmol/L (ref 135–145)

## 2018-10-22 LAB — CALCIUM, IONIZED

## 2018-10-22 MED ORDER — VITAMIN B-1 100 MG PO TABS
100.0000 mg | ORAL_TABLET | Freq: Every day | ORAL | Status: DC
  Administered 2018-10-23 – 2018-10-24 (×2): 100 mg via ORAL
  Filled 2018-10-22 (×2): qty 1

## 2018-10-22 MED ORDER — RAMELTEON 8 MG PO TABS
8.0000 mg | ORAL_TABLET | Freq: Every day | ORAL | Status: DC
  Administered 2018-10-22 – 2018-10-23 (×2): 8 mg via ORAL
  Filled 2018-10-22 (×3): qty 1

## 2018-10-22 MED ORDER — FOLIC ACID 1 MG PO TABS
1.0000 mg | ORAL_TABLET | Freq: Every day | ORAL | Status: DC
  Administered 2018-10-23 – 2018-10-24 (×2): 1 mg via ORAL
  Filled 2018-10-22 (×2): qty 1

## 2018-10-22 MED ORDER — LISINOPRIL 40 MG PO TABS
40.0000 mg | ORAL_TABLET | Freq: Every day | ORAL | Status: DC
  Administered 2018-10-22 – 2018-10-24 (×3): 40 mg via ORAL
  Filled 2018-10-22 (×3): qty 1

## 2018-10-22 MED ORDER — ENOXAPARIN SODIUM 60 MG/0.6ML ~~LOC~~ SOLN
0.5000 mg/kg | SUBCUTANEOUS | Status: DC
  Administered 2018-10-23: 60 mg via SUBCUTANEOUS
  Filled 2018-10-22 (×2): qty 0.6

## 2018-10-22 MED ORDER — INSULIN GLARGINE 100 UNIT/ML ~~LOC~~ SOLN
20.0000 [IU] | Freq: Every day | SUBCUTANEOUS | Status: DC
  Administered 2018-10-22 – 2018-10-24 (×3): 20 [IU] via SUBCUTANEOUS
  Filled 2018-10-22 (×4): qty 0.2

## 2018-10-22 MED ORDER — HALOPERIDOL LACTATE 5 MG/ML IJ SOLN
1.0000 mg | Freq: Once | INTRAMUSCULAR | Status: AC
  Administered 2018-10-22: 1 mg via INTRAMUSCULAR
  Filled 2018-10-22: qty 1

## 2018-10-22 MED ORDER — POTASSIUM CHLORIDE CRYS ER 20 MEQ PO TBCR
40.0000 meq | EXTENDED_RELEASE_TABLET | Freq: Once | ORAL | Status: AC
  Administered 2018-10-22: 40 meq via ORAL
  Filled 2018-10-22: qty 2

## 2018-10-22 MED ORDER — AMITRIPTYLINE HCL 25 MG PO TABS
75.0000 mg | ORAL_TABLET | Freq: Every evening | ORAL | Status: DC | PRN
  Administered 2018-10-22: 75 mg via ORAL
  Filled 2018-10-22: qty 3

## 2018-10-22 NOTE — Progress Notes (Addendum)
Inpatient Diabetes Program Recommendations  AACE/ADA: New Consensus Statement on Inpatient Glycemic Control (2015)  Target Ranges:  Prepandial:   less than 140 mg/dL      Peak postprandial:   less than 180 mg/dL (1-2 hours)      Critically ill patients:  140 - 180 mg/dL    Spoke with daughter and patient yesterday evening and introduced the idea of insulin at home to control glucose levels. Daughter reports Dr. Arelia Sneddon got an A1c of 9.8   1.5 months ago. A1c now recorded at 11.5%. Discussed options of oral ,med combinations versus insulin and that glucose goals can be better reached with insulin in this case.  Educated patient and daughter on insulin pen use at home. Reviewed contents of insulin flexpen starter kit. Reviewed all steps if insulin pen including attachment of needle, 2-unit air shot, dialing up dose, giving injection, removing needle, disposal of sharps, storage of unused insulin, disposal of insulin etc. Patient able to provide successful return demonstration. Also reviewed troubleshooting with insulin pen.   Discussed Glucose goals. Discussed s/s hypoglycemia and treatment for it. Spoke with patient and daughter in regards to following up with Dr. Tretha Sciara in addition to getting referred to Endocrinologist. Daughter and patient knows to call MD for glucose levels <70 and >200. SW   MD to give patient Rxs for insulin pens and insulin pen needles.   Thanks,  Tama Headings RN, MSN, BC-ADM Inpatient Diabetes Coordinator Team Pager [phone] (8a-5p)

## 2018-10-22 NOTE — Progress Notes (Signed)
Physical Therapy Treatment Patient Details Name: Matthew [name] MRN: [id number] DOB: August 17, 1945 Today's Date: 10/22/2018    History of Present Illness Pt is a 73 y/o male admitted secondary to AMS. MRI negative for acute abnormality. Pt with encephalopathy, however, workup still pending. Pt is s/p lumbar puncture. PMH includes CKD, thyroid cancer, COPD, HTN, and DM.     PT Comments    Patient seen for mobility progression. Pt tolerated session well. Pt continues to demonstrate impaired balance and poor coordination requiring assist for balance and safe use of AD. Continue to progress as tolerated.    Follow Up Recommendations  CIR;Supervision/Assistance - 24 hour     Equipment Recommendations  Rolling walker with 5" wheels;3in1 (PT)    Recommendations for Other Services       Precautions / Restrictions Precautions Precautions: Fall Restrictions Weight Bearing Restrictions: No    Mobility  Bed Mobility               General bed mobility comments: pt OOB in chair upon arrival  Transfers Overall transfer level: Needs assistance Equipment used: Rolling walker (2 wheeled) Transfers: Sit to/from Stand Sit to Stand: Min assist         General transfer comment: verbal and tactile cues for safe hand placement; assist to power up into  standing   Ambulation/Gait Ambulation/Gait assistance: Min assist Gait Distance (Feet): 100 Feet Assistive device: Rolling walker (2 wheeled) Gait Pattern/deviations: Step-through pattern;Shuffle;Decreased step length - right;Decreased step length - left;Trunk flexed;Drifts right/left Gait velocity: Decreased   General Gait Details: pt initially with shuffling steps; poor coordination noted; cues for increased bilat step lengths and for safe use of AD; min A to steady especially with horizontal head turns and turning    Chief Strategy Officer    Modified Rankin (Stroke Patients Only)       Balance  Overall balance assessment: Needs assistance Sitting-balance support: No upper extremity supported;Feet supported Sitting balance-Leahy Scale: Fair     Standing balance support: Bilateral upper extremity supported;During functional activity Standing balance-Leahy Scale: Poor                              Cognition Arousal/Alertness: Awake/alert Behavior During Therapy: WFL for tasks assessed/performed;Agitated Overall Cognitive Status: Impaired/Different from baseline Area of Impairment: Memory;Following commands;Safety/judgement;Problem solving                     Memory: Decreased short-term memory Following Commands: Follows one step commands with increased time Safety/Judgement: Decreased awareness of safety;Decreased awareness of deficits   Problem Solving: Slow processing;Requires verbal cues;Requires tactile cues;Decreased initiation        Exercises      General Comments General comments (skin integrity, edema, etc.): daughter present      Pertinent Vitals/Pain Pain Assessment: Faces Faces Pain Scale: Hurts a little bit Pain Location: back (chronic) Pain Descriptors / Indicators: Guarding;Discomfort Pain Intervention(s): Repositioned    Home Living                      Prior Function            PT Goals (current goals can now be found in the care plan section) Progress towards PT goals: Progressing toward goals    Frequency    Min 3X/week      PT Plan Current plan remains appropriate  Co-evaluation              AM-PAC PT "6 Clicks" Mobility   Outcome Measure  Help needed turning from your back to your side while in a flat bed without using bedrails?: A Little Help needed moving from lying on your back to sitting on the side of a flat bed without using bedrails?: A Little Help needed moving to and from a bed to a chair (including a wheelchair)?: A Little Help needed standing up from a chair using your arms  (e.g., wheelchair or bedside chair)?: A Little Help needed to walk in hospital room?: A Little Help needed climbing 3-5 steps with a railing? : A Lot 6 Click Score: 17    End of Session Equipment Utilized During Treatment: Gait belt Activity Tolerance: Patient limited by fatigue Patient left: with call bell/phone within reach;with family/visitor present;in chair;with chair alarm set Nurse Communication: Mobility status PT Visit Diagnosis: Unsteadiness on feet (R26.81);Muscle weakness (generalized) (M62.81);Difficulty in walking, not elsewhere classified (R26.2);Other symptoms and signs involving the nervous system (D74.128)     Time: 7867-6720 PT Time Calculation (min) (ACUTE ONLY): 23 min  Charges:  $Gait Training: 23-37 mins                     Earney Navy, PTA Acute Rehabilitation Services Pager: [phone] Office: [phone]     Darliss Cheney 10/22/2018, 1:13 PM

## 2018-10-22 NOTE — Progress Notes (Signed)
Subjective: Matthew [name] says that he feels alright today. Has no complaint. He did not have bowel movement yesterday. His grand daughter and daughter are present in the room. They mentions that they are informed about his syphilis are abnormal and he may have a positive test. Their questions and concerns addressed and we discussed the plan about talking to infection disease service for further work up and plan treatment with Penicillin. We also discussed we will start him on Insulin after discharge.  Objective:  Vital signs in last 24 hours: Vitals:   10/21/18 1954 10/21/18 2312 10/22/18 0312 10/22/18 0326  BP:  (!) 156/88 (!) 194/95 (!) 177/99  Pulse:  (!) 103 99 (!) 101  Resp:  (!) 23 (!) 21 16  Temp:  98.1 F (36.7 C)  97.6 F (36.4 C)  TempSrc:  Oral  Oral  SpO2: 96% 96% 96% 95%  Weight:      Height:        General: Elderly male, NAD, laying in bed Cardiac: RRR, no m/r/g Pulmonary: Minimal wheezing throughout lung fields, normal work of breathing, on RA Abdomen: Soft, mildly distended, normoactive bowel sounds, Extremity: No LE edema Neuro: Awake, alert, tired appearing,     Assessment/Plan:  Active Problems:   Encephalopathy acute  [name] with history of thyroid cancer status post resection and hypothyroidism, hypertension, diabetes mellitus, CKD stage II, COPD who presented after being found to have altered mental status and significantly elevated hyperglycemia at home.Noted to have a worsening headache and confusion for the past few months.  Acute encephalopathy May be multifactorial due to HHS, hypothyroidism, electrolyte abnormalities, possible viral encephalitis.  CT head and MRI negative.  EEG unremarkable.  Lumbar puncture showed protein 52, glucose 117, WBC 24, some evidence of viral infection infection.  HSV in CSF was negative, discontinued acyclovir.  RPR serum came back reactive, with1:1, antibodies was nonreactive though.  Neurology  recommended discussing with ID for further testing recommendations.  Discussed with ID who recommended ordering VDRL on CSF, think that it could be a false positive.  Today patient is still having some confusion, significantly improved from admission.  He has been asymptomatic and has had no leukocytosis.  Given the improvement it may be more related to his metabolic encephalopathy however will follow-up on VDRL to assess for neurosyphilis.  -Follow-up CSF paraneoplastic panel, added VDRL -Depending on results of VDRL will consult ID -Discontinued acyclovir -Monitor CBC and BMP -Continue to treat thyroid disease and monitor her his diabetes -DT/OT recommended CIR versus max home health services, patient likely will not want to go to CIR and home health services will need to be arranged -IR consult was made  HHS Diabetes mellitus On glimepiride and metformin at home.  A1c was up to 11.  Likely due to medication noncompliance.  Patient does not seem well controlled on his home regimen.  He is currently on 10 units Lantus and sliding scale insulin, yesterday he received a total of 34 units of NovoLog.  CBGs have ranged from 196-293.  We will increase his Lantus today, have diabetes coordinator help with teaching how to take insulin at home.  Will need start insulin on discharge.  -Increase Lantus to 20 units daily -Continue SSI -Good CBGs -Diabetes coordinator to assist with insulin education -We will need home health services if patient were to go home  Hypertensive urgency: The pressures remained elevated, ranging from 156-194/88-99.  Patient had been on his home lisinopril 20 mg daily,  does not seem to be well controlled.  -Increase lisinopril 40 mg daily  Hypothyroidism: History of thyroid cancer status post radiation therapy: TSH 18.2, free T4 0.6.  Likely due to noncompliance of his synthroid for a while.  -Continue PO synthroid 200 mcg, unsure what day he needs to take 400 mcg    Electrolyte abnormalities: K improved to 3.4 today, calcium still low at 7.5. PTH low at 13. 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   -F/u ionized calcium.  -Discontinued IV fluids, will replete K with PO -Continue to monitor and replete as needed  FEN: No fluids, replete lytes prn, HH diet  VTE ppx: Lovenox  Code Status: FULL    Dispo: Anticipated discharge in approximately 1-2 day(s).   Asencion Noble, MD 10/22/2018, 6:59 AM Pager: [phone]

## 2018-10-22 NOTE — Progress Notes (Addendum)
0915: Family at bedside, Neurologist at bedside. Family updated on condition.  1100: MD paged around 45. Patient has small red blister at tip of penis. MD came up to view area.  1824: Patient continues to become agitated. Pulled off equipment, putting on personal clothing. Daughter remains at bedside. Page out to on call physician.  1840: Second page to physician.  1942: Patient in hallway, threatening family and staff. Security called. 1mg  haldol given.

## 2018-10-22 NOTE — Evaluation (Signed)
Occupational Therapy Evaluation Patient Details Name: Matthew [name] MRN: [id number] DOB: Sep 26, 1945 Today's Date: 10/22/2018    History of Present Illness Pt is a 73 y/o male admitted secondary to AMS. MRI negative for acute abnormality. Pt with encephalopathy, however, workup still pending. Pt is s/p lumbar puncture. PMH includes CKD, thyroid cancer, COPD, HTN, and DM.    Clinical Impression   This 73 yo male admitted with above presents to acute OT with decreased cognition (not sure if if in beginning he really did not know dtr in room or was "playing with [place]", but at end of session he answered question right off), decreased balance both affecting his safety and independence with basic ADLs. He will benefit from acute OT with follow up OT on CIR.    Follow Up Recommendations  CIR;Supervision/Assistance - 24 hour    Equipment Recommendations  3 in 1 bedside commode       Precautions / Restrictions Precautions Precautions: Fall Restrictions Weight Bearing Restrictions: No      Mobility Bed Mobility               General bed mobility comments: pt OOB in chair upon arrival  Transfers Overall transfer level: Needs assistance Equipment used: Rolling walker (2 wheeled) Transfers: Sit to/from Stand Sit to Stand: Min assist         General transfer comment: VCs for safe hand placement; pt would run into objects with wheels of RW and then self correct; pt ambulated 50 feet with min A with RW    Balance Overall balance assessment: Needs assistance Sitting-balance support: No upper extremity supported;Feet supported Sitting balance-Leahy Scale: Good     Standing balance support: During functional activity;Single extremity supported Standing balance-Leahy Scale: Poor Standing balance comment: standing at sink for grooming, always had one hand on the sink counter                           ADL either performed or assessed with clinical judgement   ADL  Overall ADL's : Needs assistance/impaired Eating/Feeding: Supervision/ safety;Sitting   Grooming: Min guard;Standing;Wash/dry face;Brushing hair Grooming Details (indicate cue type and reason): VCs required for both tasks Upper Body Bathing: Set up;Supervision/ safety;Sitting   Lower Body Bathing: Minimal assistance;Sit to/from stand   Upper Body Dressing : Minimal assistance;Sitting   Lower Body Dressing: Minimal assistance;Sit to/from stand   Toilet Transfer: Minimal assistance;Ambulation;Requires wide/bariatric;BSC   Toileting- Clothing Manipulation and Hygiene: Minimal assistance;Sit to/from stand               Vision Patient Visual Report: No change from baseline              Pertinent Vitals/Pain Pain Assessment: Faces Faces Pain Scale: Hurts a little bit Pain Location: back (chronic) Pain Descriptors / Indicators: Discomfort Pain Intervention(s): Limited activity within patient's tolerance;Repositioned     Hand Dominance Right   Extremity/Trunk Assessment Upper Extremity Assessment Upper Extremity Assessment: Overall WFL for tasks assessed           Communication Communication Communication: No difficulties   Cognition Arousal/Alertness: Awake/alert Behavior During Therapy: WFL for tasks assessed/performed Overall Cognitive Status: Impaired/Different from baseline Area of Impairment: Orientation;Memory;Following commands;Awareness;Problem solving                 Orientation Level: Disoriented to;Place;Situation   Memory: Decreased short-term memory Following Commands: Follows one step commands inconsistently;Follows one step commands with increased time Safety/Judgement: Decreased awareness of safety;Decreased awareness of  deficits Awareness: Intellectual Problem Solving: Slow processing;Requires verbal cues General Comments: Pt initially stated it was April but corrected self pretty fast to June. We were talking about a garden and he said  he had not planted his yet because we just had a late frost the other day. At first of session, I asked who was in the room with him he said he did not know who he was (it was his dtr not a guy), when further asked he said "I don't know him". He could tell us he had 4 dtrs and name them but he did not regard person in his room as his dt. At end of session he was able to tell me her name right away. When asked to comb his hair he stated the comb laying on the sink was not his, had to explain to him that it was the one that the hospital gave him. Also when asked to wash his face he just stood there, when I pointed out the washcloth he stated "we this?"   General Comments  daughter present            Home Living Family/patient expects to be discharged to:: Private residence Living Arrangements: Spouse/significant other Available Help at Discharge: Family;Available 24 hours/day Type of Home: House Home Access: Stairs to enter [employer] of Steps: 3   Home Layout: One level     Bathroom Shower/Tub: Tub/shower unit;Walk-in shower   Bathroom Toilet: Standard     Home Equipment: None          Prior Functioning/Environment Level of Independence: Independent        Comments: drives, mows yard, does some cooking        OT Problem List: Impaired balance (sitting and/or standing);Decreased cognition;Decreased knowledge of use of DME or AE;Decreased safety awareness;Pain      OT Treatment/Interventions: Self-care/ADL training;DME and/or AE instruction;Balance training;Cognitive remediation/compensation    OT Goals(Current goals can be found in the care plan section) Acute Rehab OT Goals Patient Stated Goal: to not wear the mask--I can't breath OT Goal Formulation: With patient Time For Goal Achievement: 11/05/18 Potential to Achieve Goals: Good  OT Frequency: Min 2X/week              AM-PAC OT "6 Clicks" Daily Activity     Outcome Measure Help from another  person eating meals?: None Help from another person taking care of personal grooming?: A Little Help from another person toileting, which includes using toliet, bedpan, or urinal?: A Little Help from another person bathing (including washing, rinsing, drying)?: A Little Help from another person to put on and taking off regular upper body clothing?: A Little Help from another person to put on and taking off regular lower body clothing?: A Little 6 Click Score: 19   End of Session Equipment Utilized During Treatment: Gait belt;Rolling walker  Activity Tolerance: Patient tolerated treatment well Patient left: in chair;with call bell/phone within reach;with chair alarm set  OT Visit Diagnosis: Unsteadiness on feet (R26.81);Muscle weakness (generalized) (M62.81);Other symptoms and signs involving cognitive function                Time: 1352-1419 OT Time Calculation (min): 27 min Charges:  OT General Charges $OT Visit: 1 Visit OT Evaluation $OT Eval Moderate Complexity: 1 Mod OT Treatments $Self Care/Home Management : 8-22 mins  Golden Circle, OTR/L Acute [employer] Pager [phone] Office [phone]     Almon Register 10/22/2018, 2:38 PM

## 2018-10-22 NOTE — Progress Notes (Signed)
Paged by nurse that patient was pulling off leads and wanting to leave.  Went to bedside to see patient with attending, Dr. Dareen Piano.  Daughter was present.  On arrival patient was calm but stated that he wanted to leave, orientation wise he was able to tell us that he was in [place] and that it was June and 2020, however was unable to tell us daughter's name the fact that he was in the hospital.  He was unable to tell us what he was here for.  Patient was adamant about leaving, however we discussed that we need to work on getting him home health and need to follow-up on his CSF results.  Patient was agreeable to staying for now.  Matthew [name] is still having some confusion, he is not oriented and it appears that it will be unsafe for him to return home. He is unable to tell us what happened here. He does not have capacity to leave AMA. Discussed with nurse. Will continue to monitor for now. If patient becomes more agitated or aggressive can consider haldol. Avoiding benzo's given his age and the fact that it can cause more agitation. Daughter was also asking about sleep medications to allow him to relax.   -Order remalteon -If more agitation can consider haldol PRN -If patient is insistent on leaving AMA may need to get IVC

## 2018-10-22 NOTE — Progress Notes (Signed)
Inpatient Rehabilitation-Admissions Coordinator   [facility] met with pt and his daughter, Lattie Haw, at the bedside. Pt states he is ambulating fine but does endorse having some memory issues. AC discussed that CIR may be a potential venue for this patient (if medical workup reveals a diagnosis amenable to CIR) if he continues to have deficits. Pt did not outright refuse CIR initially but does indicate that he wants to go home.   Will need to determine if working diagnosis is amenable to CIR and if pt would agree to stay in house for CIR once medical workup complete. Per Lattie Haw, she thinks he would be more agreeable to [facility] at this time.   AC will follow up with pt Monday if he remains in house.  Jhonnie Garner, OTR/L  Rehab Admissions Coordinator  [phone] 10/22/2018 5:38 PM

## 2018-10-22 NOTE — Progress Notes (Signed)
Neurology Progress Note   S:// Seen and examined.  Was somewhat agitated overnight but calmed down when family arrived. Family member stated room overnight. Patient's daughter Lattie Haw and granddaughter were at bedside during this encounter.   O:// Current vital signs: BP (!) 189/93   Pulse (!) 109   Temp 97.6 F (36.4 C) (Oral)   Resp (!) 23   Ht 5\' 11"  (1.803 m)   Wt 117 kg   SpO2 93%   BMI 35.98 kg/m  Vital signs in last 24 hours: Temp:  [97.6 F (36.4 C)-98.1 F (36.7 C)] 97.6 F (36.4 C) (06/19 0326) Pulse Rate:  [99-116] 109 (06/19 0912) Resp:  [16-25] 23 (06/19 0912) BP: (155-194)/(82-99) 189/93 (06/19 0912) SpO2:  [89 %-97 %] 93 % (06/19 0912) General: Awake alert in no distress HEENT: Normocephalic atraumatic dry mucous membranes Lungs: Clear to auscultation CVS: S1-2 heard regular rate rhythm Abdomen: Mildly distended today with no tenderness Extremities: Warm well perfused no edema Neurological exam Patient awake alert oriented to self, month, but not place.  He told me he is in [place]. His speech is mildly dysarthric-but family notes that he is not wearing his dentures so edentulousness might have something to contribute. He is not a phasic. He is mildly encephalopathic as evidenced by reduced attention concentration Cranial nerves: Pupils equal round react light, extraocular movements intact, visual fields full, facial symmetry is mildly deranged with possible mild left lower facial weakness.  Facial sensations intact.  Auditory acuity is decreased bilaterally.  Tongue midline, palate midline. Motor exam: He is antigravity 5/5 in all 4 extremities. Sensory exam: Intact light touch all over with no extinction Coordination: Intact finger-nose-finger testing bilaterally   Medications  Current Facility-Administered Medications:  .  0.9 % NaCl with KCl 40 mEq / L  infusion, , Intravenous, Continuous, Chundi, Vahini, MD, Last Rate: 100 mL/hr at 10/22/18 0910,  100 mL/hr at 10/22/18 0910 .  acetaminophen (TYLENOL) tablet 650 mg, 650 mg, Oral, Q6H PRN, 650 mg at 10/21/18 2202 **OR** acetaminophen (TYLENOL) suppository 650 mg, 650 mg, Rectal, Q6H PRN, Alphonzo Grieve, MD, 650 mg at 10/20/18 2057 .  folic acid injection 1 mg, 1 mg, Intravenous, Daily, Svalina, Gorica, MD, 1 mg at 10/22/18 0912 .  insulin aspart (novoLOG) injection 0-15 Units, 0-15 Units, Subcutaneous, Q4H, Chundi, Vahini, MD, 5 Units at 10/22/18 0924 .  insulin glargine (LANTUS) injection 20 Units, 20 Units, Subcutaneous, Daily, Sherry Ruffing, Marissa M, MD .  ipratropium-albuterol (DUONEB) 0.5-2.5 (3) MG/3ML nebulizer solution 3 mL, 3 mL, Nebulization, Q6H PRN, Asencion Noble, MD, 3 mL at 10/21/18 0540 .  ipratropium-albuterol (DUONEB) 0.5-2.5 (3) MG/3ML nebulizer solution 3 mL, 3 mL, Nebulization, BID, Dareen Piano, Nischal, MD, 3 mL at 10/22/18 0749 .  levothyroxine (SYNTHROID) tablet 200 mcg, 200 mcg, Oral, QAC breakfast, Asencion Noble, MD, 200 mcg at 10/22/18 0913 .  lisinopril (ZESTRIL) tablet 40 mg, 40 mg, Oral, Daily, Sherry Ruffing, Marissa M, MD, 40 mg at 10/22/18 0912 .  nicotine (NICODERM CQ - dosed in mg/24 hours) patch 21 mg, 21 mg, Transdermal, Daily, Asencion Noble, MD, 21 mg at 10/22/18 0923 .  phenol (CHLORASEPTIC) mouth spray 1 spray, 1 spray, Mouth/Throat, PRN, Narendra, Nischal, MD .  polyethylene glycol (MIRALAX / GLYCOLAX) packet 17 g, 17 g, Oral, Daily PRN, Jari Favre, Gorica, MD .  promethazine (PHENERGAN) tablet 12.5 mg, 12.5 mg, Oral, Q6H PRN, Svalina, Gorica, MD .  thiamine (B-1) injection 100 mg, 100 mg, Intravenous, Daily, Svalina, Gorica, MD, 100 mg at  10/22/18 0913 Labs CBC    Component Value Date/Time   WBC 13.9 (H) 10/22/2018 0336   RBC 4.92 10/22/2018 0336   HGB 15.6 10/22/2018 0336   HCT 44.2 10/22/2018 0336   PLT 349 10/22/2018 0336   MCV 89.8 10/22/2018 0336   MCH 31.7 10/22/2018 0336   MCHC 35.3 10/22/2018 0336   RDW 14.3 10/22/2018 0336   LYMPHSABS  2.5 10/19/2018 1418   MONOABS 0.6 10/19/2018 1418   EOSABS 0.1 10/19/2018 1418   BASOSABS 0.1 10/19/2018 1418    CMP     Component Value Date/Time   NA 138 10/22/2018 0336   K 3.5 10/22/2018 0336   CL 107 10/22/2018 0336   CO2 21 (L) 10/22/2018 0336   GLUCOSE 201 (H) 10/22/2018 0336   BUN 11 10/22/2018 0336   CREATININE 1.05 10/22/2018 0336   CALCIUM 7.3 (L) 10/22/2018 0336   PROT 7.0 10/19/2018 1418   ALBUMIN 3.2 (L) 10/19/2018 1418   AST 21 10/19/2018 1418   ALT 32 10/19/2018 1418   ALKPHOS 143 (H) 10/19/2018 1418   BILITOT 0.7 10/19/2018 1418   GFRNONAA >60 10/22/2018 0336   GFRAA >60 10/22/2018 0336  HSV PCR in the CSF is negative.  Paraneoplastic panel ordered in CSF- was put in as an add-on test yesterday 10/21/2018.  Would expect results in 7 to 10 days.  RPR reactive 1 is to 1, Treponema pallidum antibody is nonreactive.  EEG normal CSF with 24 WBCs, 3 RBCs, protein 54, glucose 117.  HSV PCR negative  Imaging I have reviewed images in epic and the results pertinent to this consultation are: No acute changes.  Assessment: 73 year old man with past history of thyroid cancer status post thyroidectomy, uncontrolled diabetes, CKD, hypertension, noncompliance to medication brought in for evaluation of altered mental status which sounds like an acute on chronic worsening with the current presentation being worse than ever before.  He has been complaining of headaches and behavioral changes/cognitive changes that have been observed by the family over the past 2 months. He had multiple metabolic derangements on arrival which are being managed by the primary team but because of his confusion and headaches along with a very acute worsening, spinal tap was performed to rule out an infectious process. Spinal tap results from the CSF not concerning for bacterial meningitis.  CSF negative for HSV PCR. Paraneoplastic labs are sent out because of history of cancer. EEG negative for  seizure activity. Of note, today his RPR test came back as reactive.  Impression: Multifactorial toxic metabolic encephalopathy Low suspicion for CNS infection Hyperglycemia Severe hypothyroidism Medication noncompliance Possible baseline dementia Evaluate for neurosyphilis  Recommendations: At this time, I would recommend touching base with infectious diseases to get a sense of whether more testing and form of CSF RPR etc. is required to assess for neurosyphilis. Continue repeating thiamine Discontinue acyclovir Does not need meningitic antibiotic coverage Management of hyperglycemia and hypothyroidism per primary team as you are Discussed my plan with the internal medicine team and attending Dr. Dareen Piano in person on the floors.  Had a detailed discussion with the family, she had the results from CSF studies and blood tests along with MRI findings and EEG findings.  Answered all their questions. Informed them that we will be pursuing this positive RPR test and will have more information for them once we touch base with ID.  -- Amie Portland, MD [facility] Pager: [phone] If 7pm to 7am, please call on call as listed on AMION.

## 2018-10-22 NOTE — Plan of Care (Signed)

## 2018-10-22 NOTE — Care Management Important Message (Signed)
Important Message  Patient Details  Name: Matthew [name] MRN: [id number] Date of Birth: [birth year]   Medicare Important Message Given:        Orbie Pyo 10/22/2018, 9:23 AM

## 2018-10-22 NOTE — Progress Notes (Signed)
  Speech Language Pathology Treatment: Dysphagia  Patient Details Name: Matthew [name] MRN: [id number] DOB: 20-Aug-1945 Today's Date: 10/22/2018 Time: 5488-4573 SLP Time Calculation (min) (ACUTE ONLY): 10 min  Assessment / Plan / Recommendation Clinical Impression  Diagnosis still pending.  Pt confused, not fully oriented.  Dtr present at bedside.  He is not happy with the food, but is demonstrating normal swallow function today with adequate mastication, swift swallow response, and no s/s of aspiration with dysphagia 3 and thin liquid consistencies.  Dentures are unavailable.  Recommend remaining on current diet - advance to regular if dentures are brought in by family.  No SLP f/u for swallow is warranted.   HPI HPI: This is a 73 year old male with history of thyroid cancer status post resection and hypothyroidism, hypertension, diabetes mellitus, CKD stage II, COPD who presented after being found to have altered mental status and significantly elevated hyperglycemia at home, etiology not yet determined. Per neurology note, continued strong suspicion for this being multifactorial toxic metabolic encephalopathy due to hyperglycemia and hypothyroidism in the setting of noncompliance. 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       SLP Plan  All goals met       Recommendations  Diet recommendations: Dysphagia 3 (mechanical soft);Thin liquid Liquids provided via: Cup;Straw Medication Administration: Whole meds with liquid Supervision: Patient able to self feed                Oral Care Recommendations: Oral care BID Follow up Recommendations: None Plan: All goals met       GO                Matthew [name] 10/22/2018, 2:30 PM  Matthew [name] L. Matthew [name], [name] Office number [phone] Pager [phone]

## 2018-10-22 NOTE — Progress Notes (Signed)
Called by central telemetry monitoring regarding patient being off the monitor.  Matthew [name] was adamant about not allowing me to reapply his monitoring for the progressive care.  He did allow me to take a set of vitals.

## 2018-10-22 NOTE — Care Management Important Message (Signed)
Important Message  Patient Details  Name: Matthew [name] MRN: [id number] Date of Birth: Dec 03, 1945   Medicare Important Message Given:  Yes     Orbie Pyo 10/22/2018, 11:43 AM

## 2018-10-23 LAB — GLUCOSE, CAPILLARY
Glucose-Capillary: 168 mg/dL — ABNORMAL HIGH (ref 70–99)
Glucose-Capillary: 141 mg/dL — ABNORMAL HIGH (ref 70–99)
Glucose-Capillary: 176 mg/dL — ABNORMAL HIGH (ref 70–99)
Glucose-Capillary: 170 mg/dL — ABNORMAL HIGH (ref 70–99)
Glucose-Capillary: 130 mg/dL — ABNORMAL HIGH (ref 70–99)
Glucose-Capillary: 150 mg/dL — ABNORMAL HIGH (ref 70–99)

## 2018-10-23 LAB — BASIC METABOLIC PANEL
Anion gap: 9 (ref 5–15)
BUN: 11 mg/dL (ref 8–23)
CO2: 22 mmol/L (ref 22–32)
Calcium: 7.4 mg/dL — ABNORMAL LOW (ref 8.9–10.3)
Chloride: 107 mmol/L (ref 98–111)
Creatinine, Ser: 0.91 mg/dL (ref 0.61–1.24)
GFR calc Af Amer: >60 mL/min (ref >60)
GFR calc non Af Amer: >60 mL/min (ref >60)
Glucose, Bld: 115 mg/dL — ABNORMAL HIGH (ref 70–99)
Potassium: 3.1 mmol/L — ABNORMAL LOW (ref 3.5–5.1)
Sodium: 138 mmol/L (ref 135–145)

## 2018-10-23 LAB — POTASSIUM: Potassium: 4 mmol/L (ref 3.5–5.1)

## 2018-10-23 LAB — CSF CULTURE W GRAM STAIN: Culture: NO GROWTH

## 2018-10-23 LAB — CALCIUM, IONIZED: Calcium, Ionized, Serum: 4.3 mg/dL — ABNORMAL LOW (ref 4.5–5.6)

## 2018-10-23 MED ORDER — POTASSIUM CHLORIDE CRYS ER 20 MEQ PO TBCR
40.0000 meq | EXTENDED_RELEASE_TABLET | ORAL | Status: AC
  Administered 2018-10-23 (×2): 40 meq via ORAL
  Filled 2018-10-23 (×2): qty 2

## 2018-10-23 MED ORDER — ALUM & MAG HYDROXIDE-SIMETH 200-200-20 MG/5ML PO SUSP
30.0000 mL | ORAL | Status: DC | PRN
  Administered 2018-10-23: 30 mL via ORAL
  Filled 2018-10-23: qty 30

## 2018-10-23 NOTE — Progress Notes (Signed)
Subjective:   Patient states that he is doing well and slept well. He is not in any pain. Patient stated that he shouldn't be here and doesn't need to be here. He recalls the provider talking to him yesterday, but does not recall what was discussed. States that he has been having headaches (same that he has had for past 2-3 years).   Objective:  Vital signs in last 24 hours: Vitals:   10/22/18 2025 10/22/18 2042 10/23/18 0150 10/23/18 0603  BP:  (!) 162/95 (!) 147/78 140/67  Pulse:  (!) 109 88 90  Resp:  18 16 18   Temp:  97.6 F (36.4 C) 98 F (36.7 C) 98.1 F (36.7 C)  TempSrc:  Oral Oral   SpO2: 94% 95% 94% 99%  Weight:      Height:        General: Elderly Matthew [name], no acute distress, sitting up in chair Cardiac: Regular rate and rhythm, no murmurs rubs or gallops Pulmonary: Clear to auscultation, no wheezing, rhonchi, or rales Neuro: oriented x3, more alert, moves all extremities Abdomen: Soft, nontender, nondistended, normoactive bowel sounds    Assessment/Plan:  Active Problems:   Encephalopathy acute  Matthew a 73 year old Matthew [name] with history of thyroid cancer status post resection and hypothyroidism, hypertension, diabetes mellitus, CKD stage II, COPD who presented after being found to have altered mental status and significantly elevated hyperglycemia at home.Noted to have a worsening headache and confusion for the past few months.  Acute encephalopathy Improving. Likely due to metabolic encephalopathy from HHS and hypothyroidism, possibly has a viral encephalitis. CT head and MRI negative.  EEG unremarkable.  Lumbar puncture showed protein 52, glucose 117, WBC 24, some evidence of viral infection infection.  HSV in CSF was negative. Evaluating for neurosyphilis with VDRL in CSF and following up on paraneoplastic panel in CSF. Today he is much more alert and awake today, he was more oriented. Remains afebrile with no leukocytosis.   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 A1c was up to 11. Likely due to medication noncompliance, possibly with some underlying dementia. He is on lantus 20 units and SSI, CBGs have been well controlled with this at <180.   -Continue Lantus to 20 units daily -Continue SSI -Frequent CBGs -Diabetes coordinator to assist with insulin education -We will need home health services if patient were to go home  Hypertensive urgency: -Increased his home lisinopril to 40 mg daily yesterday, BP has continued to improve. Most recent read was 140/67, however did go up to 197/93 last night.   -Continue lisinopril 40 mg daily -Continue to monitor  Hypokalemia: -K down to 3.1 today, had stopped his fluids with K yesterday. Will replete.   -Repeat K in PM, continue to replete as needed -BMP in AM  FEN: No fluids, replete lytes prn, dysphagia diet VTE ppx: Lovenox  Code  Status: FULL    Dispo: Anticipated discharge in approximately 1-3 day(s).   Asencion Noble, MD 10/23/2018, 7:14 AM Pager: [phone]

## 2018-10-23 NOTE — Progress Notes (Signed)
Neurology Progress Note   S:// Patient seen and examined.  Appears much more cooperative, less combative and amenable to conversations and extremely pleasant in his interaction today.   O:// Current vital signs: BP (!) 172/90 (BP Location: Right Arm)   Pulse 90   Temp 98.8 F (37.1 C)   Resp 20   Ht 5\' 11"  (1.803 m)   Wt 117 kg   SpO2 95%   BMI 35.98 kg/m  Vital signs in last 24 hours: Temp:  [97.6 F (36.4 C)-98.8 F (37.1 C)] 98.8 F (37.1 C) (06/20 0843) Pulse Rate:  [88-109] 90 (06/20 1101) Resp:  [16-24] 20 (06/20 0843) BP: (140-180)/(67-101) 172/90 (06/20 1101) SpO2:  [91 %-99 %] 95 % (06/20 0843) FiO2 (%):  [21 %] 21 % (06/20 0842) General: Much well groomed today, awake alert in no distress HEENT: Normocephalic atraumatic CVs: G8-Q7 heard regular rate rhythm respiratory: Breathing normally saturating normally on room air Neurological exam Patient is awake alert oriented x3 His speech is non-dysarthric although he is edentulous so small amount of dysarthria is normal for him. He has no aphasia His attention concentration is mildly reduced He does not remember having had a spinal tap few days ago He did provide me correct details on the number of children he has and their names. Cranial: Pupils equal round reactive light, extraocular movements intact, visual fields full, face symmetric, facial sensation intact, auditory acuity grossly intact, palate midline, shoulder shrug intact, tongue midline. Motor exam: Antigravity 5/5 in all 4 extremities.  No asterixis. Sensory exam: Intact light touch all over Coordination: Intact finger-nose-finger testing    Medications  Current Facility-Administered Medications:  .  acetaminophen (TYLENOL) tablet 650 mg, 650 mg, Oral, Q6H PRN, 650 mg at 10/22/18 2117 **OR** acetaminophen (TYLENOL) suppository 650 mg, 650 mg, Rectal, Q6H PRN, Alphonzo Grieve, MD, 650 mg at 10/20/18 2057 .  amitriptyline (ELAVIL) tablet 75 mg, 75 mg,  Oral, QHS PRN, Mosetta Anis, MD, 75 mg at 10/22/18 2108 .  enoxaparin (LOVENOX) injection 60 mg, 0.5 mg/kg, Subcutaneous, Q24H, Alvira Philips, [place] .  folic acid (FOLVITE) tablet 1 mg, 1 mg, Oral, Daily, Alvira Philips, , 1 mg at 10/23/18 6195 .  insulin aspart (novoLOG) injection 0-15 Units, 0-15 Units, Subcutaneous, Q4H, Chundi, Vahini, MD, 3 Units at 10/23/18 0856 .  insulin glargine (LANTUS) injection 20 Units, 20 Units, Subcutaneous, Daily, Asencion Noble, MD, 20 Units at 10/23/18 [phone] .  ipratropium-albuterol (DUONEB) 0.5-2.5 (3) MG/3ML nebulizer solution 3 mL, 3 mL, Nebulization, Q6H PRN, Asencion Noble, MD, 3 mL at 10/21/18 0540 .  ipratropium-albuterol (DUONEB) 0.5-2.5 (3) MG/3ML nebulizer solution 3 mL, 3 mL, Nebulization, BID, Dareen Piano, Nischal, MD, 3 mL at 10/23/18 0842 .  levothyroxine (SYNTHROID) tablet 200 mcg, 200 mcg, Oral, QAC breakfast, Asencion Noble, MD, 200 mcg at 10/23/18 [phone] .  lisinopril (ZESTRIL) tablet 40 mg, 40 mg, Oral, Daily, Asencion Noble, MD, 40 mg at 10/23/18 4580 .  nicotine (NICODERM CQ - dosed in mg/24 hours) patch 21 mg, 21 mg, Transdermal, Daily, Asencion Noble, MD, 21 mg at 10/23/18 0853 .  phenol (CHLORASEPTIC) mouth spray 1 spray, 1 spray, Mouth/Throat, PRN, Narendra, Nischal, MD .  polyethylene glycol (MIRALAX / GLYCOLAX) packet 17 g, 17 g, Oral, Daily PRN, Alphonzo Grieve, MD, 17 g at 10/22/18 1250 .  potassium chloride SA (K-DUR) CR tablet 40 mEq, 40 mEq, Oral, Q4H, Asencion Noble, MD, 40 mEq at 10/23/18 0852 .  promethazine (PHENERGAN) tablet  12.5 mg, 12.5 mg, Oral, Q6H PRN, Jari Favre, [place], MD .  ramelteon (ROZEREM) tablet 8 mg, 8 mg, Oral, QHS, Asencion Noble, MD, 8 mg at 10/22/18 2107 .  thiamine (VITAMIN B-1) tablet 100 mg, 100 mg, Oral, Daily, Alvira Philips, [place], 100 mg at 10/23/18 1610 Labs CBC    Component Value Date/Time   WBC 13.9 (H) 10/22/2018 0336   RBC 4.92 10/22/2018 0336   HGB 15.6  10/22/2018 0336   HCT 44.2 10/22/2018 0336   PLT 349 10/22/2018 0336   MCV 89.8 10/22/2018 0336   MCH 31.7 10/22/2018 0336   MCHC 35.3 10/22/2018 0336   RDW 14.3 10/22/2018 0336   LYMPHSABS 2.5 10/19/2018 1418   MONOABS 0.6 10/19/2018 1418   EOSABS 0.1 10/19/2018 1418   BASOSABS 0.1 10/19/2018 1418    CMP     Component Value Date/Time   NA 138 10/23/2018 0328   K 3.1 (L) 10/23/2018 0328   CL 107 10/23/2018 0328   CO2 22 10/23/2018 0328   GLUCOSE 115 (H) 10/23/2018 0328   BUN 11 10/23/2018 0328   CREATININE 0.91 10/23/2018 0328   CALCIUM 7.4 (L) 10/23/2018 0328   PROT 7.0 10/19/2018 1418   ALBUMIN 3.2 (L) 10/19/2018 1418   AST 21 10/19/2018 1418   ALT 32 10/19/2018 1418   ALKPHOS 143 (H) 10/19/2018 1418   BILITOT 0.7 10/19/2018 1418   GFRNONAA >60 10/23/2018 0328   GFRAA >60 10/23/2018 0328  RPR is likely false positive according to ID  Imaging I have reviewed images in epic and the results pertinent to this consultation are: MRI brain with no acute changes. No evidence of limbic encephalitis or HSV encephalitis on imaging.  Assessment: 73 year old man past history of thyroid cancer status post thyroidectomy uncontrolled diabetes CKD hypertension and noncompliance to medications brought in for evaluation of altered mental status, which sounded initially like acute on chronic worsening of his normal mentation.  Today had no family at the bedside with patient was able to provide me good history.  He reports that he has been having headaches now off and on for 30 years.  He does not remember the past few days, cannot recollect having had a spinal tap at bedside but says that he is compliant to his medications.  Due to the deranged glycemic status, I believe that his HHS was probably the primary cause of his presentation with confusion, visual disturbance and headaches.  That said, the CSF evaluation did show mild pleocytosis, which could be present in autoimmune/limbic  encephalitis-but diabetics do have a tendency to have increased cell count in the CSF.  Given the reassuring exam today, I will watch him for another day prior to considering treatment with IVIG, that I had already started to discuss with the primary team, for possible autoimmune encephalitis. In the meantime I will also check a few other things as below.  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 Paraneoplastic panel in the CSF is pending. If he continues to do well over the next number of days, he can probably be discharged home with outpatient follow-up once he is medically optimized.  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   -- Amie Portland, MD [facility] Pager: [phone] If 7pm to 7am, please call on call as listed on AMION.

## 2018-10-24 DIAGNOSIS — Z794 Long term (current) use of insulin: Secondary | ICD-10-CM

## 2018-10-24 LAB — CULTURE, BLOOD (ROUTINE X 2)
Culture: NO GROWTH
Culture: NO GROWTH

## 2018-10-24 LAB — BASIC METABOLIC PANEL
Anion gap: 8 (ref 5–15)
BUN: 9 mg/dL (ref 8–23)
CO2: 24 mmol/L (ref 22–32)
Calcium: 7.6 mg/dL — ABNORMAL LOW (ref 8.9–10.3)
Chloride: 106 mmol/L (ref 98–111)
Creatinine, Ser: 0.88 mg/dL (ref 0.61–1.24)
GFR calc Af Amer: >60 mL/min (ref >60)
GFR calc non Af Amer: >60 mL/min (ref >60)
Glucose, Bld: 149 mg/dL — ABNORMAL HIGH (ref 70–99)
Potassium: 3.5 mmol/L (ref 3.5–5.1)
Sodium: 138 mmol/L (ref 135–145)

## 2018-10-24 LAB — GLUCOSE, CAPILLARY
Glucose-Capillary: 140 mg/dL — ABNORMAL HIGH (ref 70–99)
Glucose-Capillary: 185 mg/dL — ABNORMAL HIGH (ref 70–99)
Glucose-Capillary: 292 mg/dL — ABNORMAL HIGH (ref 70–99)

## 2018-10-24 MED ORDER — INSULIN STARTER KIT- PEN NEEDLES (ENGLISH): 1.0000 | Freq: Once | 0 refills | Status: AC

## 2018-10-24 MED ORDER — INSULIN GLARGINE 100 UNIT/ML ~~LOC~~ SOLN: 20.0000 [IU] | Freq: Every day | SUBCUTANEOUS | 11 refills | Status: DC

## 2018-10-24 MED ORDER — LISINOPRIL 40 MG PO TABS: 40.0000 mg | ORAL_TABLET | Freq: Every day | ORAL | 0 refills | Status: AC

## 2018-10-24 MED ORDER — INSULIN GLARGINE 100 UNIT/ML SOLOSTAR PEN: 20.0000 [IU] | PEN_INJECTOR | Freq: Every day | SUBCUTANEOUS | 0 refills | Status: DC

## 2018-10-24 MED ORDER — BLOOD GLUCOSE METER KIT: PACK | 0 refills | Status: DC

## 2018-10-24 NOTE — Progress Notes (Signed)
   Subjective:  [name] was seen sitting up at bedside. Stated that he was doing well and was interactive with providers. Information regarding the patient's blood glucose was provided and the patient mentioned that he understood. He denied any new complaints today.  Family was concerned about black mold, they had found that mold in his house. Discussed this seems to be more likely due to his elevated blood sugar is significantly decreased thyroid levels. Also could be related to be syphilis however given his improvement with out any treatment this seems to be less likely.  Also concerned about memory issues and we discussed that he will follow-up with his PCP to further evaluate.   Objective:  Vital signs in last 24 hours: Vitals:   10/23/18 1216 10/23/18 1913 10/23/18 2134 10/24/18 0610  BP: (!) 161/92  (!) 166/90 (!) 172/87  Pulse: 94  95 90  Resp: 20     Temp: 98 F (36.7 C)  98.1 F (36.7 C) 98.3 F (36.8 C)  TempSrc: Oral  Oral Oral  SpO2: 94% 95% 94% 97%  Weight:      Height:        General:Elderly male, NAD, sitting in bed Cardiac: RRR, no m/r/g Pulmonary: CTABL, no wheezing, rhonchi, rales Neuro: Awake, alert, oriented x3, moves all extremities Abdomen: Soft, non-tender, normoactive bowel sounds   Assessment/Plan:  Active Problems:   Encephalopathy acute  [name] with history of thyroid cancer status post resection and hypothyroidism, hypertension, diabetes mellitus, CKD stage II, COPD who presented after being found to have altered mental status and significantly elevated hyperglycemia at home.Noted to have a worsening headache and confusion for the past few months.  Acute encephalopathy Improved, likely due to metabolic encephalopathy from HHS and hypothyrodism 2/2 medication non-compliance vs non-optimized treatment. CT head and MRI brain negative.EEG unremarkable.  Lumbar puncture showed protein 52, glucose 117, WBC 24, some evidence of viral  infection infection.  HSV in CSF was negative. Evaluating for neurosyphilis with VDRL in CSF and following up on paraneoplastic panel in CSF. Following up thyroglobulin. Today he has had more improvement in his mental status.   -Continue to treat his diabetes and thyroid disease, CBGs are much better controlled now -Discharge today -F/u thyroglobulin antibodies -F/u CSR VRDL and paraneoplastic panel -F/u with PCP in 1 week -F/u with neurology  -[facility] services will need to be arranged on discharge -CSW to assist  HHS Diabetes mellitus On glimepiride and metformin at home. A1c was up to 11. Likely due to medication noncompliance, possibly with some underlying dementia. He is getting lantus 20 and SSI here, CBGs well controlled.   -Continue Lantus to 20 units daily on discharge -Resume metformin on discharge -discontinue glimepiride on discharge -Diabetes coordinator assisted with diabetes education  Hypertensive urgency: -Now on lisinopril 40 mg daily, Bps better controlled, still has some HTN, most recent 172/87. May need adjustment or addition on hospital follow up.   -Continue lisinopril 40 mg daily -Continue to monitor  Hypokalemia: -K 3.5 today. Continue to monitor. Repeat BMP on follow up.   FEN: No fluids, replete lytes prn, dysphagia diet VTE ppx: Lovenox  Code Status: FULL    Dispo: Anticipated discharge in approximately today   Asencion Noble, MD 10/24/2018, 7:07 AM Pager: [phone]

## 2018-10-24 NOTE — TOC Transition Note (Signed)
Transition of Care [facility]) - CM/SW Discharge Note   Patient Details  Name: JUNIE ENGRAM MRN: [id number] Date of Birth: 15-May-1945  [facility] CM/SW Contact:  Carles Collet, RN Phone Number: 10/24/2018, 12:45 PM   Clinical Narrative:    Damaris Schooner w patient's granddaughter Crystal. She states that she has used [facility] in the past and would like to use it again. RW and 3/1 will be delivered to room around 2pm.  No other CM needs    Final next level of care: [place] Barriers to Discharge: No Barriers Identified   Patient Goals and CMS Choice Patient states their goals for this hospitalization and ongoing recovery are:: to go home      Discharge Placement                       Discharge Plan and Services                DME Arranged: Walker rolling, 3-N-1 DME Agency: [employer] Date DME Agency Contacted: 10/24/18 Time DME Agency Contacted: [id number] Representative spoke with at DME Agency: [place]: RN, PT, OT, Nurse's Aide [place] Agency: [place] Date [place]: 10/24/18 Time [place]: 1245 Representative spoke with at [place]: [place] ([place]) Interventions     Readmission Risk Interventions No flowsheet data found.

## 2018-10-24 NOTE — Progress Notes (Signed)
Patient daughter call to asked if she drop her credit card in her father room while she was visiting him earlier. Looked into room with NT but didn't find anything. Patient daughter call again and want to know if patient can be tested for black mole. I told her I will passed it on and if she could call back to speak with the doctor has she requested to speak to patient doctor.

## 2018-10-24 NOTE — Progress Notes (Signed)
Occupational Therapy Treatment Patient Details Name: Matthew [name] MRN: [id number] DOB: 28-Oct-1945 Today's Date: 10/24/2018    History of present illness Pt is a 73 y/o male admitted secondary to [place]. MRI negative for acute abnormality. Pt with encephalopathy, however, workup still pending. Pt is s/p lumbar puncture. PMH includes CKD, thyroid cancer, COPD, HTN, and DM.    OT comments  Pt admitted with see above. Pt currently with functional limitations due to the deficits listed below (see OT Problem List). Pt required supervision with ADLS due to safety with standing. Pt was able to ambulate with RW with supervision.   Pt will benefit from skilled OT to increase their safety and independence with ADL and functional mobility for ADL to facilitate discharge to venue listed below. At this time the patient would prefer to go home with family.    Follow Up Recommendations  CIR;Supervision/Assistance - 24 hour ( patient prefer to go home with family)     Equipment Recommendations  3 in 1 bedside commode    Recommendations for Other Services      Precautions / Restrictions Precautions Precautions: Fall Precaution Comments: cued for saftey  Restrictions Weight Bearing Restrictions: No       Mobility Bed Mobility Overal bed mobility: Independent Bed Mobility: Supine to Sit     Supine to sit: Independent        Transfers Overall transfer level: Needs assistance Equipment used: Rolling walker (2 wheeled) Transfers: Sit to/from Stand Sit to Stand: Supervision         General transfer comment: cues for pacing     Balance Overall balance assessment: Needs assistance Sitting-balance support: No upper extremity supported;Feet supported Sitting balance-Leahy Scale: Good                                     ADL either performed or assessed with clinical judgement   ADL Overall ADL's : Needs assistance/impaired Eating/Feeding: Independent;Sitting   Grooming:  Wash/dry hands;Supervision/safety;Standing   Upper Body Bathing: Supervision/ safety;Sitting;Standing;Cueing for safety   Lower Body Bathing: Supervison/ safety;Cueing for safety;Cueing for sequencing   Upper Body Dressing : Supervision/safety;Cueing for safety;Cueing for sequencing;Sitting;Standing   Lower Body Dressing: Set up;Cueing for safety;Cueing for sequencing   Toilet Transfer: Supervision/safety;Comfort height toilet   Toileting- Clothing Manipulation and Hygiene: Supervision/safety;Cueing for safety;Cueing for sequencing   Tub/ Shower Transfer: Supervision/safety;Cueing for safety;Cueing for sequencing   Functional mobility during ADLs: Supervision/safety;Cueing for safety;Cueing for sequencing       Vision   Vision Assessment?: No apparent visual deficits   Perception     Praxis      Cognition Arousal/Alertness: Awake/alert Behavior During Therapy: WFL for tasks assessed/performed Overall Cognitive Status: Impaired/Different from baseline Area of Impairment: Following commands;Safety/judgement;Awareness                 Orientation Level: Time   Memory: Decreased short-term memory Following Commands: Follows multi-step commands with increased time Safety/Judgement: Decreased awareness of safety   Problem Solving: (cue for saftey ) General Comments: made conversation to daughter about grandchildren         Exercises     Shoulder Instructions       General Comments cues for saftey     Pertinent Vitals/ Pain       Pain Assessment: 0-10 Pain Score: 1  Pain Location: back (chronic) Pain Descriptors / Indicators: Discomfort Pain Intervention(s): Limited activity within patient's tolerance;Monitored during  session;Repositioned  Home Living                                          Prior Functioning/Environment              Frequency  Min 2X/week        Progress Toward Goals  OT Goals(current goals can now be  found in the care plan section)  Progress towards OT goals: Progressing toward goals  Acute Rehab OT Goals Patient Stated Goal: to go home today OT Goal Formulation: With patient Time For Goal Achievement: 11/05/18 Potential to Achieve Goals: Good  Plan Discharge plan needs to be updated    Co-evaluation                 AM-PAC OT "6 Clicks" Daily Activity     Outcome Measure   Help from another person eating meals?: None Help from another person taking care of personal grooming?: A Little Help from another person toileting, which includes using toliet, bedpan, or urinal?: A Little Help from another person bathing (including washing, rinsing, drying)?: A Little Help from another person to put on and taking off regular upper body clothing?: A Little Help from another person to put on and taking off regular lower body clothing?: A Little 6 Click Score: 19    End of Session Equipment Utilized During Treatment: Gait belt;Rolling walker  OT Visit Diagnosis: Unsteadiness on feet (R26.81);Muscle weakness (generalized) (M62.81);Other symptoms and signs involving cognitive function   Activity Tolerance Patient tolerated treatment well   Patient Left in bed;with chair alarm set;with family/visitor present   Nurse Communication Mobility status        Time: 6945-0388 OT Time Calculation (min): 29 min  Charges: OT Treatments $Self Care/Home Management : 23-37 mins  Joeseph Amor OTR/L  Acute Rehab Services  [phone] office number [phone] pager number    Joeseph Amor 10/24/2018, 11:50 AM

## 2018-10-24 NOTE — Discharge Instructions (Signed)
Matthew [name],   It has been a pleasure working with you and we are glad you're feeling better. You were hospitalized for confusion and change in your mental status. You were found to have a really high blood sugar, abnormal thyroid labs, and electrolyte abnormalaties, we have corrected all of those things. You did have some abnormal labs that could represent syphilis, however this may be a false positive so we are doing further testing and will update you on the results.   It will be important for you to take your medications when you go home. You needed insulin while you were here and you will need to continue taking this.  START taking insulin 20 units daily. You can continue taking the metformin, please STOP taking the glimepiride.   Follow up with your primary care provider in 1-2 weeks, please discuss with your PCP to determine if further neurology evaluation is needed  If your symptoms worsen or you develop new symptoms, please seek medical help whether it is your primary care provider or emergency department.    Glucose Products:  ReliOn glucose products raise low blood sugar fast. Tablets are free of fat, caffeine, sodium and gluten. They are portable and easy to carry, making it easier for people with diabetes to BE PREPARED for lows.  Glucose Tablets Available in 6 flavors  10 ct...................................... $1.00  50 ct...................................... $3.98 Glucose Shot..................................$1.48 Glucose Gel....................................$3.44  Alcohol Swabs Alcohol swabs are used to sterilize your injection site. All of our swabs are individually wrapped for maximum safety, convenience and moisture retention. ReliOn Alcohol Swabs  100 ct Swabs..............................$1.00  400 ct Swabs..............................$3.74  Lancets ReliOn offers three lancet options conveniently designed to work with almost every lancing device. Each  features a protective disk, which guarantees sterility before testing. ReliOn Lancets  100 ct Lancets $1.56  200 ct Lancets $2.64 Available in Ultra-Thin, Thin & Micro-Thin ReliOn 2-IN-1 Lancing Device  50 ct Lancets..................................... $3.44 Available in 30 gauge and 25 gauge ReliOn Lancing Device....................$5.84  Blood Glucose Monitors ReliOn offers a full range of blood glucose testing options to provide an accurate, affordable system that meets each person's unique needs and preferences. Prime Meter....................................... $9.00 Prime Test Strips  25 test strips.................................... $5.00  50 test strips.................................... $9.00  100 test strips.................................$17.88 Premier [employer] Meter    $18.98 Premier Voice Meter  .  $14.98 Premier Test Strips  50 test strips.................................... $9.00  100 test strips.................................$17.88 Premier [employer] Kit    $19.44 Kit includes:  50 test strips  10 lancets  Lancing device  Carry case  Ketone Test Strips  50 test strips  ..  $6.64  Human Insulin  Novolin/ReliOn (recombinant DNA origin) is manufactured for [employer] by [employer] Insulin* with Vial..........$24.88 Available in N, R, 70/30 Novolin/ReliOn Insulin Pens*    $42.88 Available in 70/30 only  Insulin Delivery ReliOn syringes and pen needles provide precision technology, comfort and accuracy in insulin delivery at affordable prices. ReliOn Pen Needles*  50 ct....................................................$9.00 Available in 71m, 6852m 52m33m 33m11mliOn Insulin Syringes*  100 ct . $12.58 Available in 29G, 30G & 31G (3/10cc, 1/2cc & 1cc units)

## 2018-10-24 NOTE — Progress Notes (Addendum)
Neurology Progress Note   S:// Seen and examined.  Gets agitated on the prospect of not be able to go home but otherwise has remained well overnight. Family concerned that they found black mold in his house while cleaning his house and that the symptoms might be related to that.  He has gotten extensive testing and it does not seem to be related to black mold and I conveyed this to the family.  If they remain concerned about any sort of fungal or other process, they can again speak with primary care to pursue this further.   O:// Current vital signs: BP (!) 172/87 (BP Location: Right Arm)   Pulse 90   Temp 98.3 F (36.8 C) (Oral)   Resp 20   Ht 5\' 11"  (1.803 m)   Wt 117 kg   SpO2 97%   BMI 35.98 kg/m  Vital signs in last 24 hours: Temp:  [98.1 F (36.7 C)-98.3 F (36.8 C)] 98.3 F (36.8 C) (06/21 0610) Pulse Rate:  [90-95] 90 (06/21 0610) BP: (166-172)/(87-90) 172/87 (06/21 0610) SpO2:  [94 %-97 %] 97 % (06/21 0610) My exam is essentially unchanged from yesterday He is awake alert oriented x3. Speech is non-dysarthric.  He has no aphasia. Attention concentration is borderline to mildly reduced. He does not remember having had a spinal tap a few days ago and has no recollection of the first few days of his admission. Was able to provide me other details correctly including his children's names, grand kids etc. Cranial nerves: Pupils equal round reactive light, extraocular movements intact visual fields are full face symmetric facial sensation intact auditory acuity acuity grossly intact, palate midline, shoulder shrug intact, tongue midline Motor exam: Symmetric 5/5 in all 4 extremities.  No asterixis. Sensory exam: Intact to light touch all over with no extinction Coordination: Intact finger-nose-finger testing.  Medications  Current Facility-Administered Medications:  .  acetaminophen (TYLENOL) tablet 650 mg, 650 mg, Oral, Q6H PRN, 650 mg at 10/24/18 0803 **OR**  acetaminophen (TYLENOL) suppository 650 mg, 650 mg, Rectal, Q6H PRN, Alphonzo Grieve, MD, 650 mg at 10/20/18 2057 .  alum & mag hydroxide-simeth (MAALOX/MYLANTA) 200-200-20 MG/5ML suspension 30 mL, 30 mL, Oral, Q4H PRN, Asencion Noble, MD, 30 mL at 10/23/18 1205 .  amitriptyline (ELAVIL) tablet 75 mg, 75 mg, Oral, QHS PRN, Mosetta Anis, MD, 75 mg at 10/22/18 2108 .  enoxaparin (LOVENOX) injection 60 mg, 0.5 mg/kg, Subcutaneous, Q24H, Alvira Philips, [place], 60 mg at 10/23/18 1205 .  folic acid (FOLVITE) tablet 1 mg, 1 mg, Oral, Daily, Alvira Philips, [place], 1 mg at 10/24/18 6606 .  insulin aspart (novoLOG) injection 0-15 Units, 0-15 Units, Subcutaneous, Q4H, Chundi, [place], MD, 3 Units at 10/24/18 0804 .  insulin glargine (LANTUS) injection 20 Units, 20 Units, Subcutaneous, Daily, Asencion Noble, MD, 20 Units at 10/24/18 0919 .  ipratropium-albuterol (DUONEB) 0.5-2.5 (3) MG/3ML nebulizer solution 3 mL, 3 mL, Nebulization, Q6H PRN, Asencion Noble, MD, 3 mL at 10/21/18 0540 .  levothyroxine (SYNTHROID) tablet 200 mcg, 200 mcg, Oral, QAC breakfast, Asencion Noble, MD, 200 mcg at 10/24/18 0804 .  lisinopril (ZESTRIL) tablet 40 mg, 40 mg, Oral, Daily, Asencion Noble, MD, 40 mg at 10/24/18 0804 .  nicotine (NICODERM CQ - dosed in mg/24 hours) patch 21 mg, 21 mg, Transdermal, Daily, Asencion Noble, MD, 21 mg at 10/24/18 0806 .  phenol (CHLORASEPTIC) mouth spray 1 spray, 1 spray, Mouth/Throat, PRN, Aldine Contes, MD .  polyethylene  glycol (MIRALAX / GLYCOLAX) packet 17 g, 17 g, Oral, Daily PRN, Alphonzo Grieve, MD, 17 g at 10/22/18 1250 .  promethazine (PHENERGAN) tablet 12.5 mg, 12.5 mg, Oral, Q6H PRN, Jari Favre, [place], MD .  ramelteon (ROZEREM) tablet 8 mg, 8 mg, Oral, QHS, Asencion Noble, MD, 8 mg at 10/23/18 2124 .  thiamine (VITAMIN B-1) tablet 100 mg, 100 mg, Oral, Daily, Alvira Philips, [place], 100 mg at 10/24/18 0804 Labs CBC    Component Value Date/Time   WBC  13.9 (H) 10/22/2018 0336   RBC 4.92 10/22/2018 0336   HGB 15.6 10/22/2018 0336   HCT 44.2 10/22/2018 0336   PLT 349 10/22/2018 0336   MCV 89.8 10/22/2018 0336   MCH 31.7 10/22/2018 0336   MCHC 35.3 10/22/2018 0336   RDW 14.3 10/22/2018 0336   LYMPHSABS 2.5 10/19/2018 1418   MONOABS 0.6 10/19/2018 1418   EOSABS 0.1 10/19/2018 1418   BASOSABS 0.1 10/19/2018 1418    CMP     Component Value Date/Time   NA 138 10/24/2018 0447   K 3.5 10/24/2018 0447   CL 106 10/24/2018 0447   CO2 24 10/24/2018 0447   GLUCOSE 149 (H) 10/24/2018 0447   BUN 9 10/24/2018 0447   CREATININE 0.88 10/24/2018 0447   CALCIUM 7.6 (L) 10/24/2018 0447   PROT 7.0 10/19/2018 1418   ALBUMIN 3.2 (L) 10/19/2018 1418   AST 21 10/19/2018 1418   ALT 32 10/19/2018 1418   ALKPHOS 143 (H) 10/19/2018 1418   BILITOT 0.7 10/19/2018 1418   GFRNONAA >60 10/24/2018 0447   GFRAA >60 10/24/2018 0447    Imaging I have reviewed images in epic and the results pertinent to this consultation are: MRI brain with no acute changes.  No evidence of limbic encephalitis or HSV encephalitis on imaging  Assessment: 73 year old man past history of thyroid cancer status post thyroidectomy, uncontrolled diabetes, CKD, hypertension and also noncompliance with medications brought in for evaluation of altered mental status, that initially was acute on chronic worsening of his normal mentation but he did not improve upon improvement of his laboratory values and a neurological consultation was obtained. His initial exam was consistent with someone with acute mental status change, decreased attention concentration and sudden onset of confusion along with headaches that prompted a spinal tap which was unremarkable for HSV or bacterial meningitis. The spinal tap did show increased number of white cells with mildly increased protein-that raises suspicion of an autoimmune encephalitis for which a paraneoplastic panel as well as antithyroglobulin  antibodies have been sent-both of them are pending. His exam is improving with improvement in his blood sugars consistently and supplementation of thyroxine.  I suspect that his spinal fluid changes might have been reflective of an ongoing inflammatory/systemic process and his paraneoplastic panel and other tests would also be negative. Clinically he appears well, has no evidence of HSV encephalitis or exam not suggestive of any meningitic features and a spinal tap to back that. Imaging of the brain has been unremarkable for acute strokes or any acute process.  EEG has been negative for seizures  At this time, I think he should be fine for discharge and should follow-up with his outpatient primary care provider with strict blood sugar control and thyroid management along with good outpatient follow-up.   Impression: Multifactorial toxic metabolic encephalopathy He was evaluated for infectious versus autoimmune encephalitis.  Paraneoplastic antibody panel and thyroglobulin antibodies pending at this time.  Recommendations: Strict blood pressure control Strict blood sugar control  Strict adherence to thyroid medications Follow-up with PCP on discharge The family feels that after discharge, he continues to have any cognitive deficits, that time the family can call the outpatient neurology clinic for an appointment for evaluation.  I do not see an emergent need to refer him to outpatient neurology for anything specific at this time. I relayed my plan to the primary team resident. Please call neurology with questions.  -- Amie [place], MD [facility] Pager: [phone] If 7pm to 7am, please call on call as listed on AMION.

## 2018-10-24 NOTE — Progress Notes (Signed)
Matthew [name] to be D/C'd home with home health per MD order. Discussed with the patient, daughter, and granddaughter, and all questions fully answered. VVS, Skin clean, dry and intact without evidence of skin break down, no evidence of skin tears noted.  An After Visit Summary was printed and given to the patient. Patient's family shown how to check blood sugars and administer insulin. Patient ambulated off unit with daughter, and D/C home via private auto. Walker and BSC delivered to room.  Melonie Florida  10/24/2018 2:14 PM

## 2018-10-24 NOTE — Progress Notes (Signed)
Inpatient Diabetes Program Recommendations  AACE/ADA: New Consensus Statement on Inpatient Glycemic Control (2015)  Target Ranges:  Prepandial:   less than 140 mg/dL      Peak postprandial:   less than 180 mg/dL (1-2 hours)      Critically ill patients:  140 - 180 mg/dL   Lab Results  Component Value Date   GLUCAP 185 (H) 10/24/2018   HGBA1C 11.5 (H) 10/20/2018    Review of Glycemic Control Results for HOWARD, BUNTE (MRN [id number]) as of 10/24/2018 10:57  Ref. Range 10/23/2018 21:36 10/24/2018 00:05 10/24/2018 07:39  Glucose-Capillary Latest Ref Range: 70 - 99 mg/dL 150 (H) 140 (H) 185 (H)   Spoke with granddaughter and daughter as patient and family had additional questions prior to discharge. DM coordinator has sent over information via email and has demonstrated in person on 6/19. Encouraged to work with RN, review videos and arrange a family meeting to coordinate schedules and to ensure consistency in insulin regimen and injections. Education provided on DM, need for insulin, when to call MD, survival skills including hypo interventions and frequency of checking CBGs. Educated family on insulin pen use at home. Reviewed contents of insulin flexpen starter kit. Reviewed all steps if insulin pen including attachment of needle, 2-unit air shot, dialing up dose, giving injection, removing needle, disposal of sharps, storage of unused insulin, disposal of insulin etc. MD to give patient Rxs for insulin pens and insulin pen needles.  Family to work with RN today prior to DC and to follow up with PCP. At this time, patient has no further questions. MD: Patient will need a meter for DC. MD to place.   Thanks,  Bronson Curb, MSN, RNC-OB Diabetes Coordinator [phone] (8a-5p)

## 2018-10-25 LAB — VDRL, CSF: VDRL Quant, CSF: NONREACTIVE

## 2018-10-25 LAB — THYROGLOBULIN ANTIBODY: Thyroglobulin Antibody: <1 IU/mL (ref 0.0–0.9)

## 2018-11-10 LAB — CULTURE, FUNGUS WITHOUT SMEAR

## 2018-11-24 LAB — MISC LABCORP TEST (SEND OUT): Labcorp test code: 9985

## 2018-12-27 ENCOUNTER — Encounter

## 2018-12-27 ENCOUNTER — Ambulatory Visit (INDEPENDENT_AMBULATORY_CARE_PROVIDER_SITE_OTHER): Payer: Medicare Other | Admitting: Diagnostic Neuroimaging

## 2018-12-27 ENCOUNTER — Encounter: Payer: Self-pay | Admitting: Diagnostic Neuroimaging

## 2018-12-27 ENCOUNTER — Other Ambulatory Visit: Payer: Self-pay

## 2018-12-27 VITALS — BP 149/80 | HR 100 | Temp 97.8°F | Ht 71.0 in | Wt 235.0 lb

## 2018-12-27 DIAGNOSIS — G9340 Encephalopathy, unspecified: Secondary | ICD-10-CM | POA: Diagnosis not present

## 2018-12-27 DIAGNOSIS — G3184 Mild cognitive impairment, so stated: Secondary | ICD-10-CM | POA: Diagnosis not present

## 2018-12-27 NOTE — Progress Notes (Signed)
[facility]  PATIENT: Matthew [name] DOB: 1945-11-09  REFERRING CLINICIAN: Arelia Sneddon HISTORY FROM: patient and daughter  REASON FOR VISIT: new consult    HISTORICAL  CHIEF COMPLAINT:  Chief Complaint  Patient presents with  . Memory Loss    rm 7, New Pt, dgtr- Pennie  "Has missed doses of synthroid, was in hospital with hyperglycemia" MMSE 22    HISTORY OF PRESENT ILLNESS:   73 year old male with diabetes, chronic kidney disease, hypertension, thyroid cancer status post thyroidectomy, evaluated in hospital in June 2020 for altered mental status and confusion.  Patient was found to have significant hyperglycemia and hypothyroidism.  Possible viral encephalitis or other CSF inflammatory process was raised without specific cause.  Since that time patient is almost back to baseline.  He still has some mild confusion with the days of the week and short-term memory.  Daughter is helping him with his medications by filling a pillbox and coming to his home twice a week.  She checks on him on a daily basis by phone.  He mainly stays at home, takes care of his own ADLs, and sometimes goes out of the home by car to local shop or restaurant.  Prior to hospitalization patient was having some mild memory problems but not that significant.  Apparently he had some trouble keeping up with his medications and this may have been part of the reason why his sugar and thyroid function got out of control leading to hospitalization.    REVIEW OF SYSTEMS: Full 14 system review of systems performed and negative with exception of: As per HPI.  ALLERGIES: No Known Allergies  HOME MEDICATIONS: Outpatient Medications Prior to Visit  Medication Sig Dispense Refill  . aspirin 81 MG tablet Take 81 mg by mouth daily.      . divalproex (DEPAKOTE) 125 MG DR tablet TAKE 1 TO 2 TABLETS BY MOUTH ONCE DAILY IN THE EVENING FOR SLEEP MOOD AND MIGRAINE PREVENTION.    Marland Kitchen GLIMEPIRIDE PO Take 5 mg by  mouth daily.    Marland Kitchen JARDIANCE 10 MG TABS tablet Take 10 mg by mouth daily.    Marland Kitchen levothyroxine (SYNTHROID) 200 MCG tablet Take 200-400 mcg by mouth See admin instructions. Take 1 tablet by mouth once daily then take 2 tablets on the same day each week for hypothyroid    . lisinopril (ZESTRIL) 40 MG tablet Take 1 tablet (40 mg total) by mouth daily. 30 tablet 0  . metFORMIN (GLUCOPHAGE) 1000 MG tablet Take 1,000 mg by mouth 2 (two) times a day.    . blood glucose meter kit and supplies Dispense based on patient and insurance preference. Use up to four times daily as directed. (FOR ICD-10 E10.9, E11.9). (Patient not taking: Reported on 12/27/2018) 1 each 0  . amitriptyline (ELAVIL) 75 MG tablet Take 75 mg by mouth at bedtime as needed for sleep.    . insulin glargine (LANTUS) 100 UNIT/ML injection Inject 0.2 mLs (20 Units total) into the skin daily. 10 mL 11  . Insulin Glargine (LANTUS) 100 UNIT/ML Solostar Pen Inject 20 Units into the skin daily. 15 mL 0   No facility-administered medications prior to visit.     PAST MEDICAL HISTORY: Past Medical History:  Diagnosis Date  . Adenomatous polyps   . Benign neoplasm of colon   . Chronic kidney disease    stage 2  . COPD (chronic obstructive pulmonary disease) ([place])   . Diabetes mellitus without complication ([place])   . External hemorrhoids   .  Hemorrhoids   . Thyroid cancer ([place])     PAST SURGICAL HISTORY: Past Surgical History:  Procedure Laterality Date  . THYROIDECTOMY      FAMILY HISTORY: Family History  Problem Relation Age of Onset  . Heart attack Father   . Hypertension Father   . Breast cancer Mother   . Hypertension Mother   . Hypertension Brother   . Heart attack Brother   . Heart attack Sister     SOCIAL HISTORY: Social History   Socioeconomic History  . Marital status: Single    Spouse name: Not on file  . Number of children: 4  . Years of education: 8  . Highest education level: Not on file  Occupational  History    Comment: retired  Scientific laboratory technician  . Financial resource strain: Not on file  . Food insecurity    Worry: Not on file    Inability: Not on file  . Transportation needs    Medical: Not on file    Non-medical: Not on file  Tobacco Use  . Smoking status: Current Every Day Smoker    Packs/day: 1.50    Types: Cigarettes  . Smokeless tobacco: Never Used  . Tobacco comment: Counseling sheet given in exam room for smoking   Substance and Sexual Activity  . Alcohol use: Not Currently  . Drug use: No  . Sexual activity: Not on file  Lifestyle  . Physical activity    Days per week: Not on file    Minutes per session: Not on file  . Stress: Not on file  Relationships  . Social Herbalist on phone: Not on file    Gets together: Not on file    Attends religious service: Not on file    Active member of club or organization: Not on file    Attends meetings of clubs or organizations: Not on file    Relationship status: Not on file  . Intimate partner violence    Fear of current or ex partner: Not on file    Emotionally abused: Not on file    Physically abused: Not on file    Forced sexual activity: Not on file  Other Topics Concern  . Not on file  Social History Narrative   Lives alone   Caffeine- coffee 6 cups, 1/2 Pepsi maybe     PHYSICAL EXAM  GENERAL EXAM/CONSTITUTIONAL: Vitals:  Vitals:   12/27/18 0812  BP: (!) 149/80  Pulse: 100  Temp: 97.8 F (36.6 C)  Weight: 235 lb (106.6 kg)  Height: _0  (1.803 m)     Body mass index is 32.78 kg/m. Wt Readings from Last 3 Encounters:  12/27/18 235 lb (106.6 kg)  10/20/18 258 lb (117 kg)  04/25/11 245 lb (111.1 kg)     Patient is in no distress; well developed, nourished and groomed; neck is supple  CARDIOVASCULAR:  Examination of carotid arteries is normal; no carotid bruits  Regular rate and rhythm, no murmurs  Examination of peripheral vascular system by observation and palpation is normal   EYES:  Ophthalmoscopic exam of optic discs and posterior segments is normal; no papilledema or hemorrhages  No exam data present  MUSCULOSKELETAL:  Gait, strength, tone, movements noted in Neurologic exam below  NEUROLOGIC: MENTAL STATUS:  MMSE - Mini Mental State Exam 12/27/2018  Orientation to time 2  Orientation to Place 4  Registration 3  Attention/ Calculation 4  Recall 2  Language- name 2 objects 2  Language- repeat 0  Language- follow 3 step command 3  Language- read & follow direction 1  Write a sentence 0  Copy design 1  Total score 22    awake, alert, oriented to person  [facility] memory   DECR attention and concentration  language fluent, comprehension intact, naming intact  fund of knowledge appropriate  CRANIAL NERVE:   2nd - no papilledema on fundoscopic exam  2nd, 3rd, 4th, 6th - pupils equal and reactive to light, visual fields full to confrontation, extraocular muscles intact, no nystagmus  5th - facial sensation symmetric  7th - facial strength symmetric  8th - hearing intact  9th - palate elevates symmetrically, uvula midline  11th - shoulder shrug symmetric  12th - tongue protrusion midline  MOTOR:   normal bulk and tone, full strength in the BUE, BLE  SENSORY:   normal and symmetric to light touch, temperature, vibration; DECR IN FEET / LEGS  COORDINATION:   finger-nose-finger, fine finger movements normal  REFLEXES:   deep tendon reflexes TRACE and symmetric; ABSENT AT ANKLES  GAIT/STATION:   SLIGHTLY WIDE BASED     DIAGNOSTIC DATA (LABS, IMAGING, TESTING) - I reviewed patient records, labs, notes, testing and imaging myself where available.  Lab Results  Component Value Date   WBC 13.9 (H) 10/22/2018   HGB 15.6 10/22/2018   HCT 44.2 10/22/2018   MCV 89.8 10/22/2018   PLT 349 10/22/2018      Component Value Date/Time   NA 138 10/24/2018 0447   K 3.5 10/24/2018 0447   CL 106 10/24/2018 0447   CO2 24  10/24/2018 0447   GLUCOSE 149 (H) 10/24/2018 0447   BUN 9 10/24/2018 0447   CREATININE 0.88 10/24/2018 0447   CALCIUM 7.6 (L) 10/24/2018 0447   PROT 7.0 10/19/2018 1418   ALBUMIN 3.2 (L) 10/19/2018 1418   AST 21 10/19/2018 1418   ALT 32 10/19/2018 1418   ALKPHOS 143 (H) 10/19/2018 1418   BILITOT 0.7 10/19/2018 1418   GFRNONAA >60 10/24/2018 0447   GFRAA >60 10/24/2018 0447   No results found for: CHOL, HDL, LDLCALC, LDLDIRECT, TRIG, CHOLHDL Lab Results  Component Value Date   HGBA1C 11.5 (H) 10/20/2018   Lab Results  Component Value Date   VITAMINB12 512 10/20/2018   Lab Results  Component Value Date   TSH 18.259 (H) 10/19/2018   10/23/18 Thyroglobulin Antibody 0.0 - 0.9 IU/mL <1.0  10/23/18  RPR Ser Ql ReactiveAbnormal     Rapid Plasma Reagin, Quant 1:1High    T Pallidum Abs Non Reactive     10/20/18 CSF studies  Tube 3  Color, CSF COLORLESS   Appearance, CSF CLEAR   Supernatant NOT INDICATED   RBC Count, CSF 3High    WBC, CSF 24High Panic     Glucose, CSF 40 - 70 mg/dL 117High    Total Protein, CSF 15 - 45 mg/dL 54High     HSV 1 DNA Negative Negative   HSV 2 DNA Negative Negative    VDRL Quant, CSF Non Rea:<1:1 Non Reactive      10/20/18 MRI brain 1. No acute intracranial abnormality. 2. 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  10/20/18 EEG - normal    ASSESSMENT AND PLAN  73 y.o. year old male here with:  Dx:  1. Encephalopathy acute   2. Mild cognitive impairment with memory loss     PLAN:  ACUTE CONFUSION (superimposed on mild memory loss) -  monitor for recovery; mild signs of underlying mild dementia vs MCI - safety / supervision issues reviewed - caregiver resources provided - caution with driving and finances  Return for pending if symptoms worsen or fail to improve, return to PCP.    Penni Bombard, MD 9/31/1216, 2:44 AM Certified in Neurology,  Neurophysiology and Neuroimaging  [facility] [address] [place], [place] [address] [phone]

## 2019-09-26 ENCOUNTER — Other Ambulatory Visit: Payer: Self-pay | Admitting: Family Medicine

## 2019-09-26 ENCOUNTER — Ambulatory Visit
Admission: RE | Admit: 2019-09-26 | Discharge: 2019-09-26 | Disposition: A | Discharge status: Home / Self Care | Payer: Medicare Other | Source: Ambulatory Visit | Attending: Family Medicine | Admitting: Family Medicine

## 2019-09-26 DIAGNOSIS — F17200 Nicotine dependence, unspecified, uncomplicated: Secondary | ICD-10-CM

## 2019-09-26 IMAGING — CR DG CHEST 2V
2 series · 2 of 2 positions shown · non-contrast
Comparison: [DATE]

CLINICAL DATA: Tobacco use.  Diabetes.  Smoker.

EXAM:
CHEST - 2 VIEW

[w chest pa]
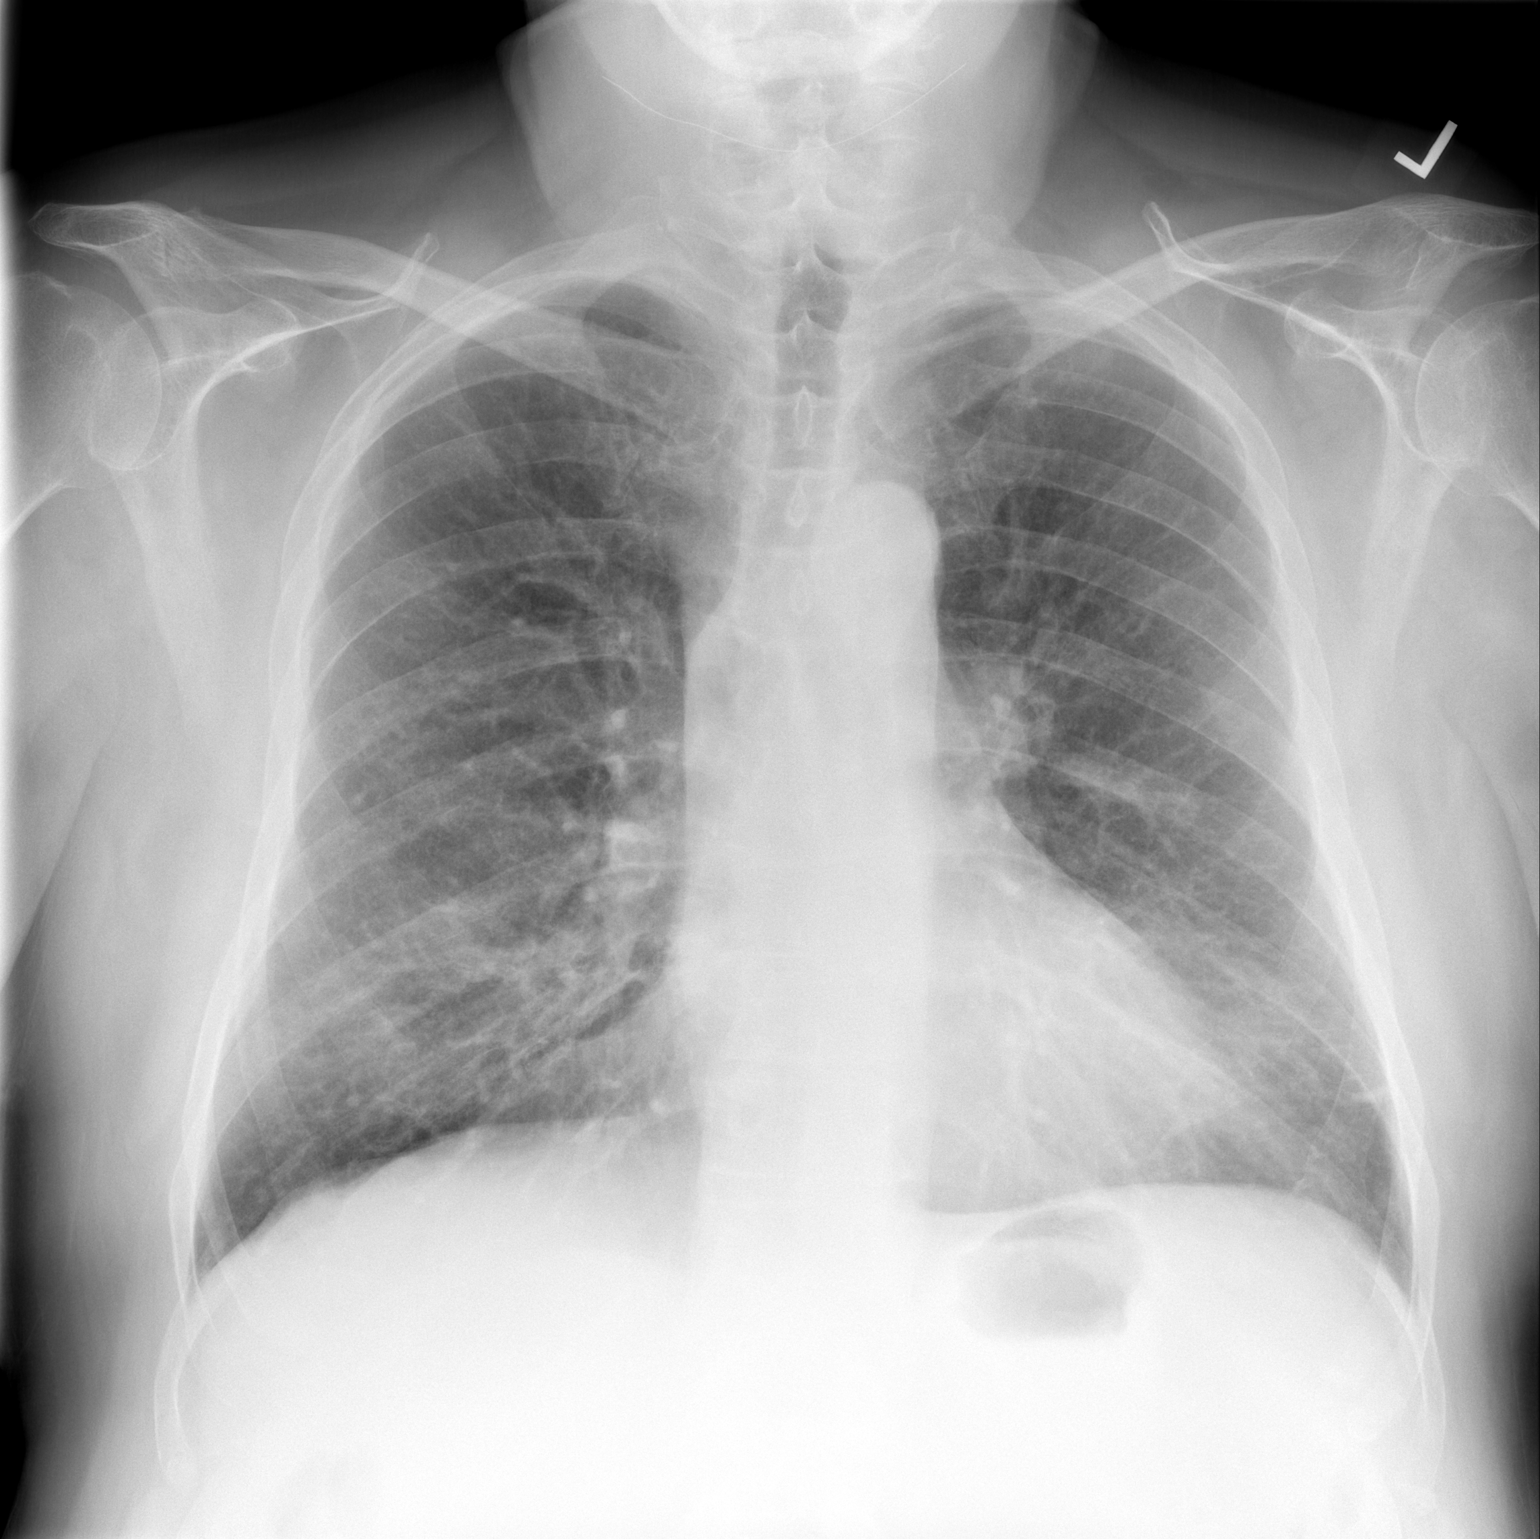

[w chest lat]
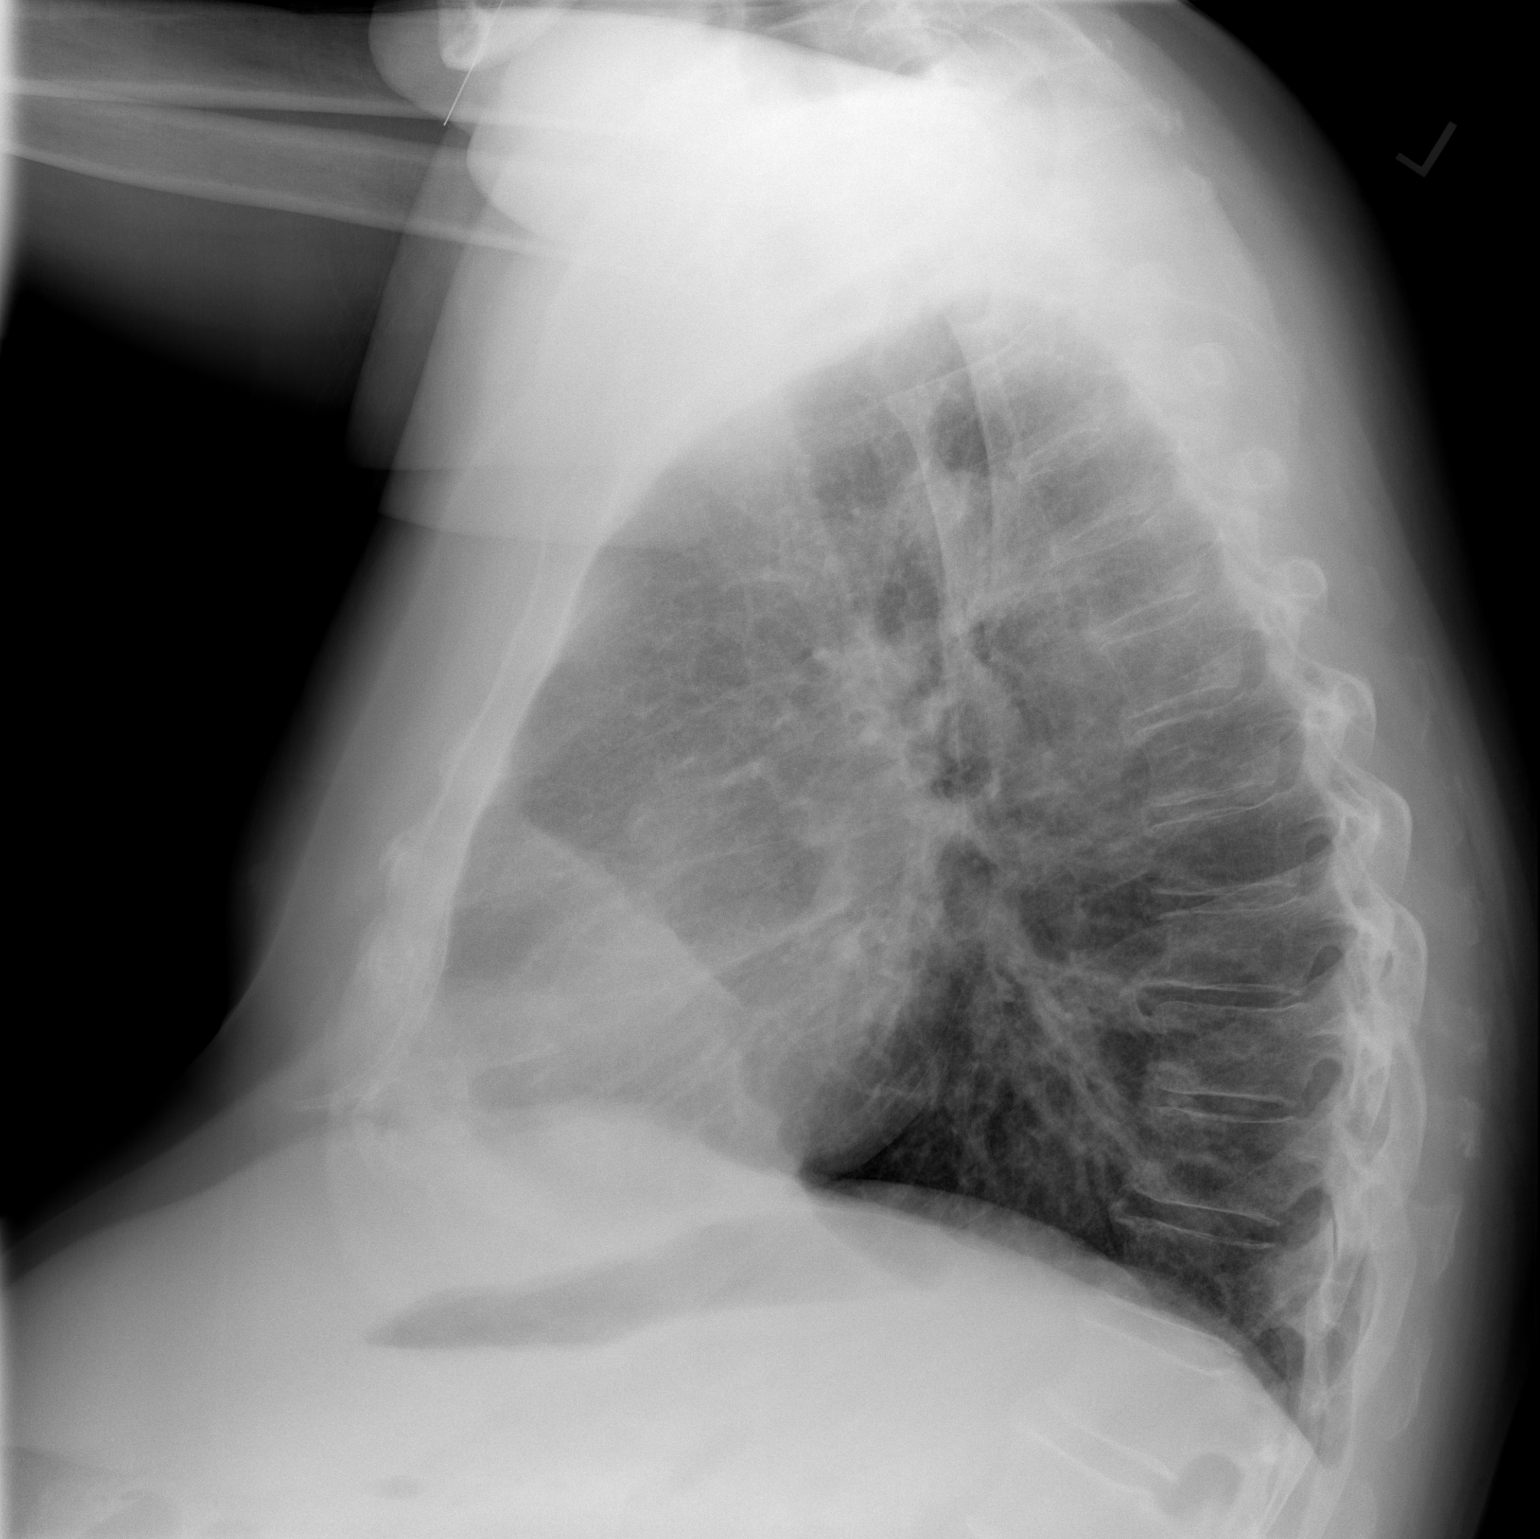

[2 of 2 positions shown; findings below may reference images not displayed]

FINDINGS: Midline trachea. Normal heart size and mediastinal contours. No
pleural effusion or pneumothorax. Diffuse peribronchial thickening.
Left lung base scarring laterally.
IMPRESSION: No acute cardiopulmonary disease.

Peribronchial thickening which may relate to chronic bronchitis or
smoking.

## 2019-11-08 ENCOUNTER — Other Ambulatory Visit (HOSPITAL_COMMUNITY): Payer: Medicare Other

## 2019-12-05 ENCOUNTER — Emergency Department (HOSPITAL_COMMUNITY): Payer: Medicare Other

## 2019-12-05 ENCOUNTER — Inpatient Hospital Stay (HOSPITAL_COMMUNITY)
Admission: EM | Admit: 2019-12-05 | Discharge: 2019-12-07 | DRG: 065 | Disposition: A | Discharge status: Home Health | Payer: Medicare Other | Attending: Neurology | Admitting: Neurology

## 2019-12-05 ENCOUNTER — Encounter (HOSPITAL_COMMUNITY): Payer: Self-pay | Admitting: Emergency Medicine

## 2019-12-05 ENCOUNTER — Inpatient Hospital Stay (HOSPITAL_COMMUNITY): Payer: Medicare Other

## 2019-12-05 DIAGNOSIS — Z79899 Other long term (current) drug therapy: Secondary | ICD-10-CM

## 2019-12-05 DIAGNOSIS — Z85850 Personal history of malignant neoplasm of thyroid: Secondary | ICD-10-CM

## 2019-12-05 DIAGNOSIS — Z7982 Long term (current) use of aspirin: Secondary | ICD-10-CM

## 2019-12-05 DIAGNOSIS — E785 Hyperlipidemia, unspecified: Secondary | ICD-10-CM | POA: Diagnosis present

## 2019-12-05 DIAGNOSIS — F0390 Unspecified dementia without behavioral disturbance: Secondary | ICD-10-CM | POA: Diagnosis present

## 2019-12-05 DIAGNOSIS — Z8249 Family history of ischemic heart disease and other diseases of the circulatory system: Secondary | ICD-10-CM | POA: Diagnosis not present

## 2019-12-05 DIAGNOSIS — Z79890 Hormone replacement therapy (postmenopausal): Secondary | ICD-10-CM

## 2019-12-05 DIAGNOSIS — F17210 Nicotine dependence, cigarettes, uncomplicated: Secondary | ICD-10-CM | POA: Diagnosis present

## 2019-12-05 DIAGNOSIS — E1122 Type 2 diabetes mellitus with diabetic chronic kidney disease: Secondary | ICD-10-CM | POA: Diagnosis present

## 2019-12-05 DIAGNOSIS — I700 Atherosclerosis of aorta: Secondary | ICD-10-CM | POA: Diagnosis present

## 2019-12-05 DIAGNOSIS — I611 Nontraumatic intracerebral hemorrhage in hemisphere, cortical: Secondary | ICD-10-CM

## 2019-12-05 DIAGNOSIS — Z20822 Contact with and (suspected) exposure to covid-19: Secondary | ICD-10-CM | POA: Diagnosis present

## 2019-12-05 DIAGNOSIS — I6389 Other cerebral infarction: Secondary | ICD-10-CM | POA: Diagnosis not present

## 2019-12-05 DIAGNOSIS — E039 Hypothyroidism, unspecified: Secondary | ICD-10-CM | POA: Diagnosis present

## 2019-12-05 DIAGNOSIS — I619 Nontraumatic intracerebral hemorrhage, unspecified: Principal | ICD-10-CM | POA: Diagnosis present

## 2019-12-05 DIAGNOSIS — Z716 Tobacco abuse counseling: Secondary | ICD-10-CM | POA: Diagnosis not present

## 2019-12-05 DIAGNOSIS — D751 Secondary polycythemia: Secondary | ICD-10-CM | POA: Diagnosis present

## 2019-12-05 DIAGNOSIS — E119 Type 2 diabetes mellitus without complications: Secondary | ICD-10-CM | POA: Diagnosis present

## 2019-12-05 DIAGNOSIS — IMO0002 Reserved for concepts with insufficient information to code with codable children: Secondary | ICD-10-CM | POA: Diagnosis present

## 2019-12-05 DIAGNOSIS — N182 Chronic kidney disease, stage 2 (mild): Secondary | ICD-10-CM | POA: Diagnosis present

## 2019-12-05 DIAGNOSIS — J439 Emphysema, unspecified: Secondary | ICD-10-CM | POA: Diagnosis present

## 2019-12-05 DIAGNOSIS — I161 Hypertensive emergency: Secondary | ICD-10-CM | POA: Diagnosis present

## 2019-12-05 DIAGNOSIS — E1165 Type 2 diabetes mellitus with hyperglycemia: Secondary | ICD-10-CM | POA: Diagnosis present

## 2019-12-05 DIAGNOSIS — R29701 NIHSS score 1: Secondary | ICD-10-CM | POA: Diagnosis present

## 2019-12-05 DIAGNOSIS — M47812 Spondylosis without myelopathy or radiculopathy, cervical region: Secondary | ICD-10-CM | POA: Diagnosis present

## 2019-12-05 DIAGNOSIS — Z86010 Personal history of colonic polyps: Secondary | ICD-10-CM

## 2019-12-05 DIAGNOSIS — Z7984 Long term (current) use of oral hypoglycemic drugs: Secondary | ICD-10-CM

## 2019-12-05 DIAGNOSIS — J449 Chronic obstructive pulmonary disease, unspecified: Secondary | ICD-10-CM | POA: Diagnosis present

## 2019-12-05 DIAGNOSIS — Z6833 Body mass index (BMI) 33.0-33.9, adult: Secondary | ICD-10-CM

## 2019-12-05 DIAGNOSIS — E669 Obesity, unspecified: Secondary | ICD-10-CM | POA: Diagnosis present

## 2019-12-05 DIAGNOSIS — R4189 Other symptoms and signs involving cognitive functions and awareness: Secondary | ICD-10-CM | POA: Diagnosis present

## 2019-12-05 LAB — DIFFERENTIAL
Abs Immature Granulocytes: 0.08 10*3/uL — ABNORMAL HIGH (ref 0.00–0.07)
Basophils Absolute: 0.1 10*3/uL (ref 0.0–0.1)
Basophils Relative: 1 %
Eosinophils Absolute: 0.1 10*3/uL (ref 0.0–0.5)
Eosinophils Relative: 1 %
Immature Granulocytes: 1 %
Lymphocytes Relative: 28 %
Lymphs Abs: 3.3 10*3/uL (ref 0.7–4.0)
Monocytes Absolute: 0.7 10*3/uL (ref 0.1–1.0)
Monocytes Relative: 6 %
Neutro Abs: 7.7 10*3/uL (ref 1.7–7.7)
Neutrophils Relative %: 63 %

## 2019-12-05 LAB — COMPREHENSIVE METABOLIC PANEL
ALT: 16 U/L (ref 0–44)
AST: 18 U/L (ref 15–41)
Albumin: 3.4 g/dL — ABNORMAL LOW (ref 3.5–5.0)
Alkaline Phosphatase: 82 U/L (ref 38–126)
Anion gap: 12 (ref 5–15)
BUN: 13 mg/dL (ref 8–23)
CO2: 24 mmol/L (ref 22–32)
Calcium: 9.6 mg/dL (ref 8.9–10.3)
Chloride: 100 mmol/L (ref 98–111)
Creatinine, Ser: 0.93 mg/dL (ref 0.61–1.24)
GFR calc Af Amer: >60 mL/min (ref >60)
GFR calc non Af Amer: >60 mL/min (ref >60)
Glucose, Bld: 141 mg/dL — ABNORMAL HIGH (ref 70–99)
Potassium: 4.1 mmol/L (ref 3.5–5.1)
Sodium: 136 mmol/L (ref 135–145)
Total Bilirubin: 0.5 mg/dL (ref 0.3–1.2)
Total Protein: 7.1 g/dL (ref 6.5–8.1)

## 2019-12-05 LAB — URINALYSIS, ROUTINE W REFLEX MICROSCOPIC
Bacteria, UA: NONE SEEN
Bilirubin Urine: NEGATIVE
Glucose, UA: >=500 mg/dL — AB
Ketones, ur: NEGATIVE mg/dL
Leukocytes,Ua: NEGATIVE
Nitrite: NEGATIVE
Protein, ur: >=300 mg/dL — AB
Specific Gravity, Urine: 1.023 (ref 1.005–1.030)
pH: 5 (ref 5.0–8.0)

## 2019-12-05 LAB — CBC
HCT: 55.7 % — ABNORMAL HIGH (ref 39.0–52.0)
Hemoglobin: 19 g/dL — ABNORMAL HIGH (ref 13.0–17.0)
MCH: 30.3 pg (ref 26.0–34.0)
MCHC: 34.1 g/dL (ref 30.0–36.0)
MCV: 88.8 fL (ref 80.0–100.0)
Platelets: 363 10*3/uL (ref 150–400)
RBC: 6.27 MIL/uL — ABNORMAL HIGH (ref 4.22–5.81)
RDW: 14.6 % (ref 11.5–15.5)
WBC: 11.9 10*3/uL — ABNORMAL HIGH (ref 4.0–10.5)
nRBC: 0 % (ref 0.0–0.2)

## 2019-12-05 LAB — I-STAT CHEM 8, ED
BUN: 20 mg/dL (ref 8–23)
Calcium, Ion: 1.21 mmol/L (ref 1.15–1.40)
Chloride: 101 mmol/L (ref 98–111)
Creatinine, Ser: 0.8 mg/dL (ref 0.61–1.24)
Glucose, Bld: 142 mg/dL — ABNORMAL HIGH (ref 70–99)
HCT: 56 % — ABNORMAL HIGH (ref 39.0–52.0)
Hemoglobin: 19 g/dL — ABNORMAL HIGH (ref 13.0–17.0)
Potassium: 4 mmol/L (ref 3.5–5.1)
Sodium: 139 mmol/L (ref 135–145)
TCO2: 25 mmol/L (ref 22–32)

## 2019-12-05 LAB — ACETAMINOPHEN LEVEL: Acetaminophen (Tylenol), Serum: 16 ug/mL (ref 10–30)

## 2019-12-05 LAB — RAPID URINE DRUG SCREEN, HOSP PERFORMED
Amphetamines: NOT DETECTED
Barbiturates: NOT DETECTED
Benzodiazepines: NOT DETECTED
Cocaine: NOT DETECTED
Opiates: NOT DETECTED
Tetrahydrocannabinol: NOT DETECTED

## 2019-12-05 LAB — ETHANOL: Alcohol, Ethyl (B): <10 mg/dL (ref <10)

## 2019-12-05 LAB — PROTIME-INR
INR: 0.9 (ref 0.8–1.2)
Prothrombin Time: 12.1 seconds (ref 11.4–15.2)

## 2019-12-05 LAB — APTT: aPTT: 32 seconds (ref 24–36)

## 2019-12-05 LAB — HEMOGLOBIN A1C
Hgb A1c MFr Bld: 7.3 % — ABNORMAL HIGH (ref 4.8–5.6)
Mean Plasma Glucose: 162.81 mg/dL

## 2019-12-05 LAB — SARS CORONAVIRUS 2 BY RT PCR (HOSPITAL ORDER, PERFORMED IN ~~LOC~~ HOSPITAL LAB): SARS Coronavirus 2: NEGATIVE

## 2019-12-05 LAB — TROPONIN I (HIGH SENSITIVITY)
Troponin I (High Sensitivity): 14 ng/L (ref <18)
Troponin I (High Sensitivity): 14 ng/L (ref <18)

## 2019-12-05 LAB — CBG MONITORING, ED: Glucose-Capillary: 192 mg/dL — ABNORMAL HIGH (ref 70–99)

## 2019-12-05 LAB — VALPROIC ACID LEVEL: Valproic Acid Lvl: <10 ug/mL — ABNORMAL LOW (ref 50.0–100.0)

## 2019-12-05 IMAGING — MR MR HEAD W/O CM
10 of 11 series · 43 of 48 positions shown · non-contrast
Comparison: Brain MRI [DATE]

Head CT [DATE]

CLINICAL DATA: Stroke follow-up

EXAM:
MRI HEAD WITHOUT CONTRAST
TECHNIQUE: Multiplanar, multiecho pulse sequences of the brain and surrounding
structures were obtained without intravenous contrast.

[Series 5: DWI · axial · 3.0mm · 0.88mm/px · z∈[-100,+52]mm · 10 of 104 slices shown (1 of 4)]
[im 1/104]
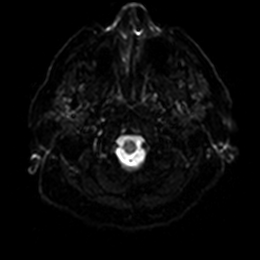
[im 12/104]
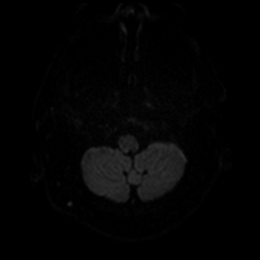
[im 23/104]
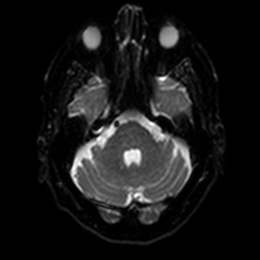
[im 35/104]
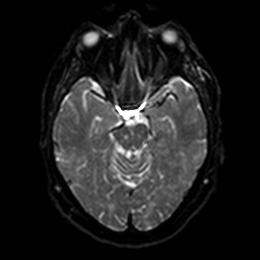
[im 46/104]
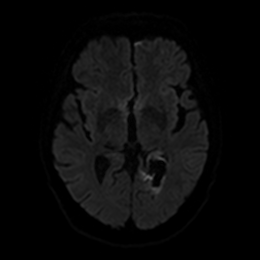
[im 58/104]
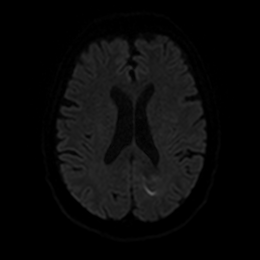
[im 69/104]
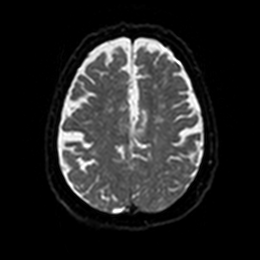
[im 81/104]
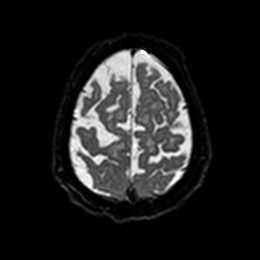
[im 92/104]
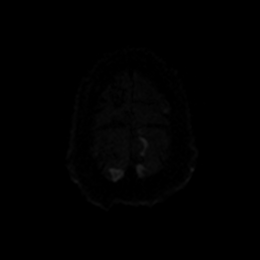
[im 104/104]
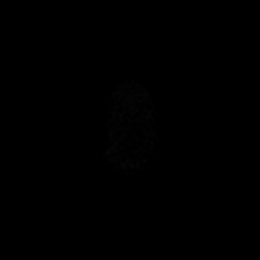

[Series 6: DWI · axial · 3.0mm · 0.88mm/px · z∈[-100,+52]mm · 5 of 52 slices shown (2 of 4)]
[im 1/52]
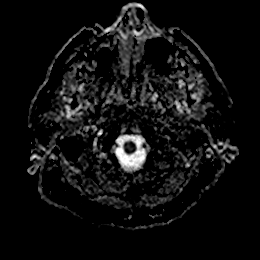
[im 13/52]
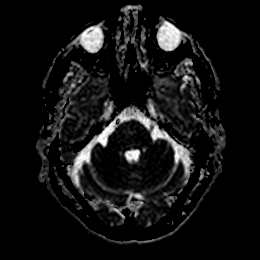
[im 26/52]
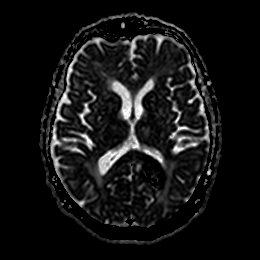
[im 39/52]
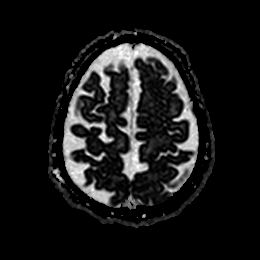
[im 52/52]
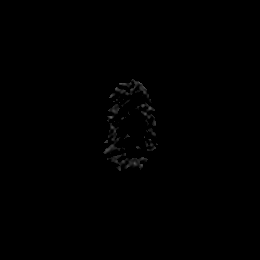

[Series 7: DWI · coronal · 4.0mm · 0.88mm/px · 6 of 72 slices shown (3 of 4)]
[im 1/72]
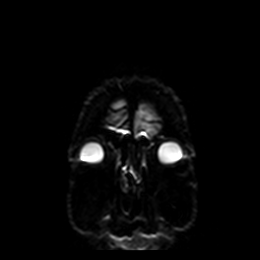
[im 15/72]
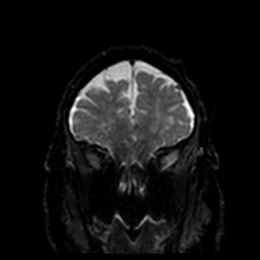
[im 29/72]
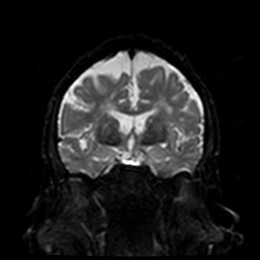
[im 43/72]
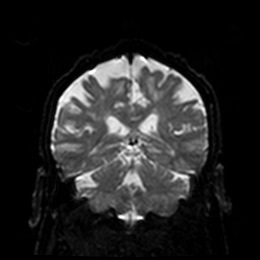
[im 57/72]
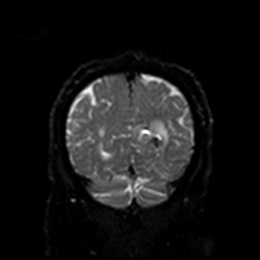
[im 72/72]
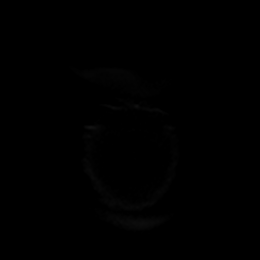

[Series 8: DWI · coronal · 4.0mm · 0.88mm/px · 3 of 36 slices shown (4 of 4)]
[im 1/36]
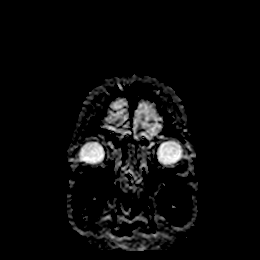
[im 18/36]
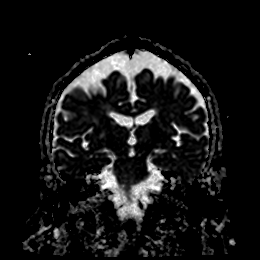
[im 36/36]
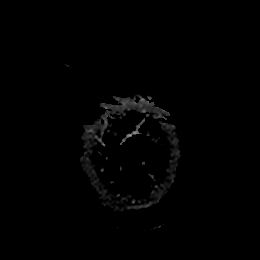

[Series 9: FLAIR · axial · 5.0mm · 0.45mm/px · z∈[-103,+47]mm · 2 of 26 slices shown]
[im 1/26]
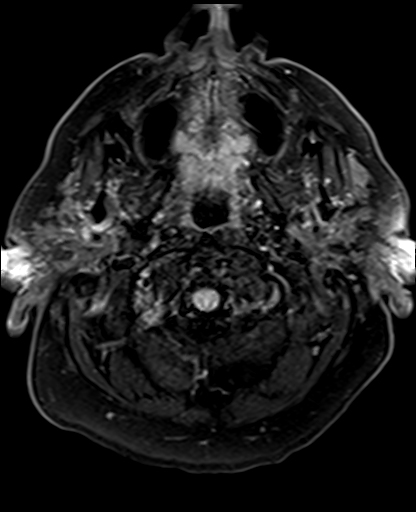
[im 26/26]
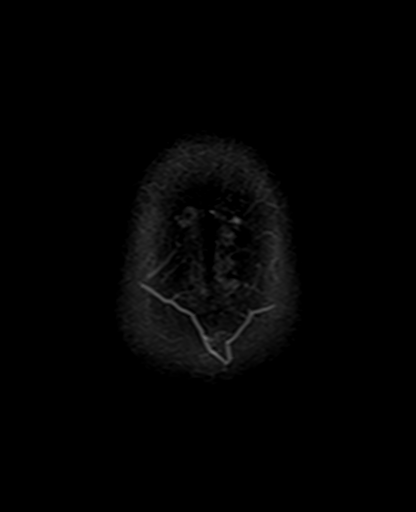

[Series 11: pha_images · axial · 3.0mm · 0.90mm/px · z∈[-110,+54]mm · 5 of 56 slices shown]
[im 1/56]
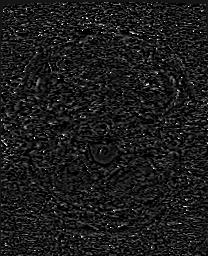
[im 14/56]
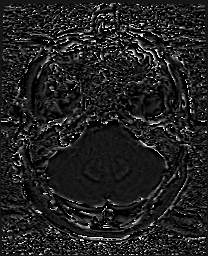
[im 28/56]
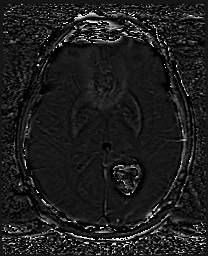
[im 42/56]
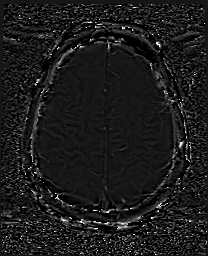
[im 56/56]
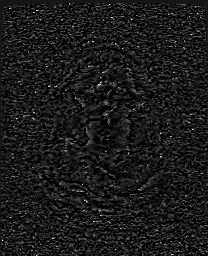

[Series 12: swi_images · axial · 3.0mm · 0.90mm/px · z∈[-110,+54]mm · 5 of 56 slices shown]
[im 1/56]
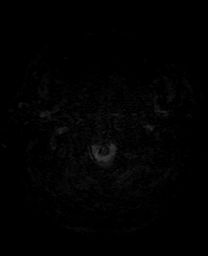
[im 14/56]
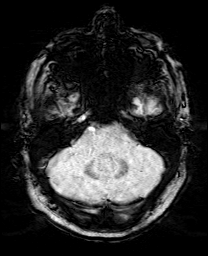
[im 28/56]
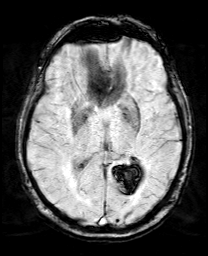
[im 42/56]
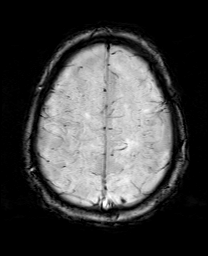
[im 56/56]
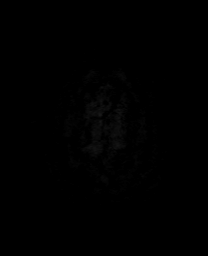

[Series 14: T1 · sagittal · 5.0mm · 0.75mm/px · 2 of 25 slices shown]
[im 1/25]
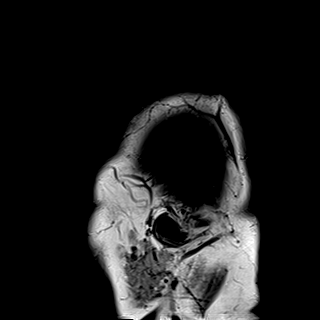
[im 25/25]
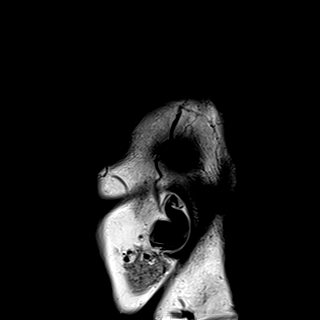

[Series 15: T2 · axial · 5.0mm · 0.72mm/px · z∈[-103,+47]mm · 2 of 26 slices shown (1 of 2)]
[im 1/26]
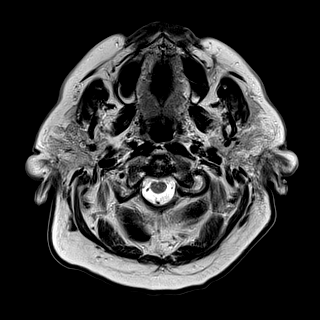
[im 26/26]
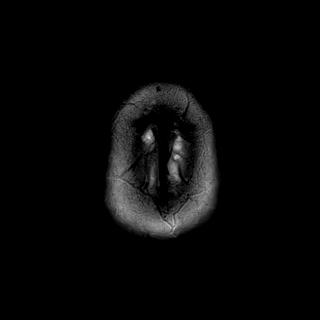

[Series 17: T2 · coronal · 5.0mm · 0.34mm/px · 3 of 30 slices shown (2 of 2)]
[im 1/30]
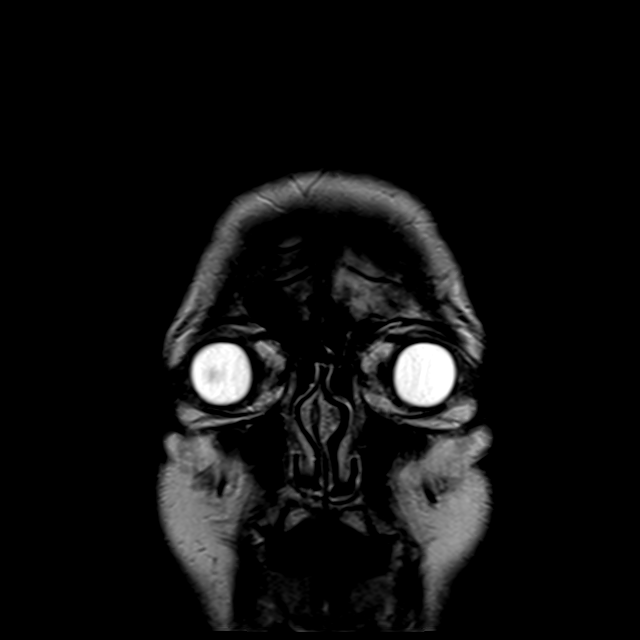
[im 15/30]
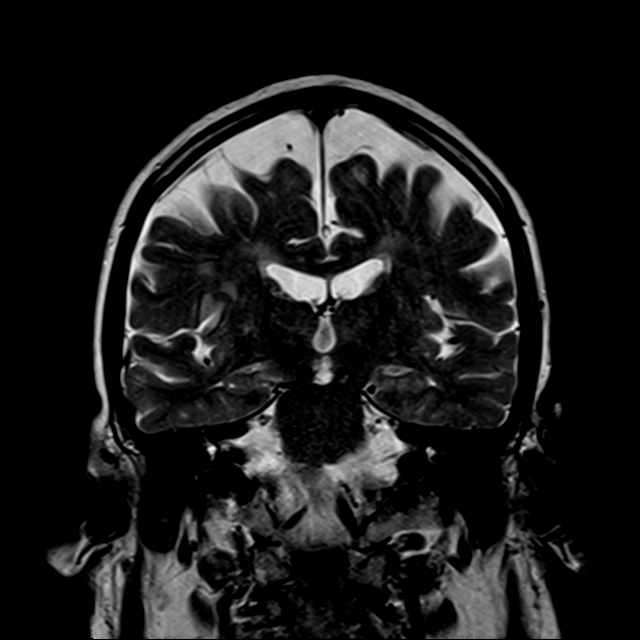
[im 30/30]
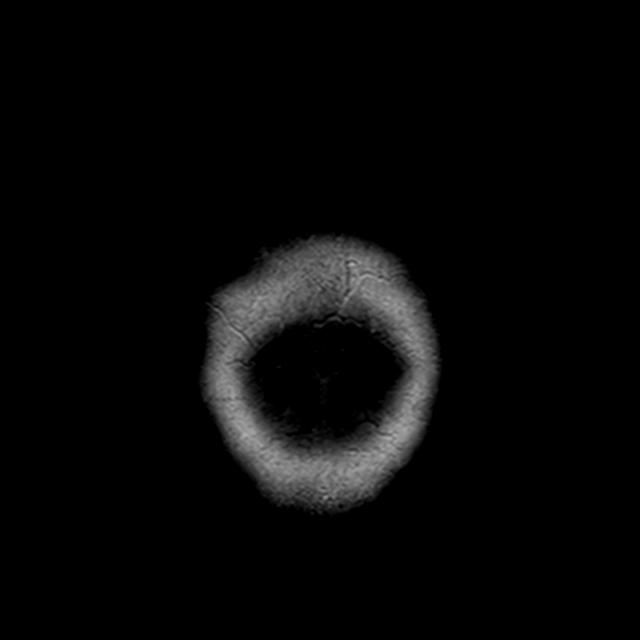

[43 of 48 positions shown; findings below may reference images not displayed]

FINDINGS: Brain: Unchanged appearance of intraparenchymal hematoma in the
posterior left parietal lobe. No acute ischemia. Early confluent
hyperintense T2-weighted signal of the periventricular and deep
white matter, most commonly due to chronic ischemic microangiopathy.
Normal volume of CSF spaces. No chronic microhemorrhage. Normal
midline structures.

Vascular: Normal flow voids.

Skull and upper cervical spine: Normal marrow signal.

Sinuses/Orbits: Negative.  Left ocular lens replacement.

Other: None.
IMPRESSION: 1. Unchanged appearance of intraparenchymal hematoma in the
posterior left parietal lobe. No new mass effect or hydrocephalus.
2. Findings of chronic ischemic microangiopathy.

## 2019-12-05 IMAGING — CR DG CHEST 2V
2 series · 2 of 2 positions shown · non-contrast
Comparison: [DATE]

CLINICAL DATA: Altered mental status.

EXAM:
CHEST - 2 VIEW

[chest lat]
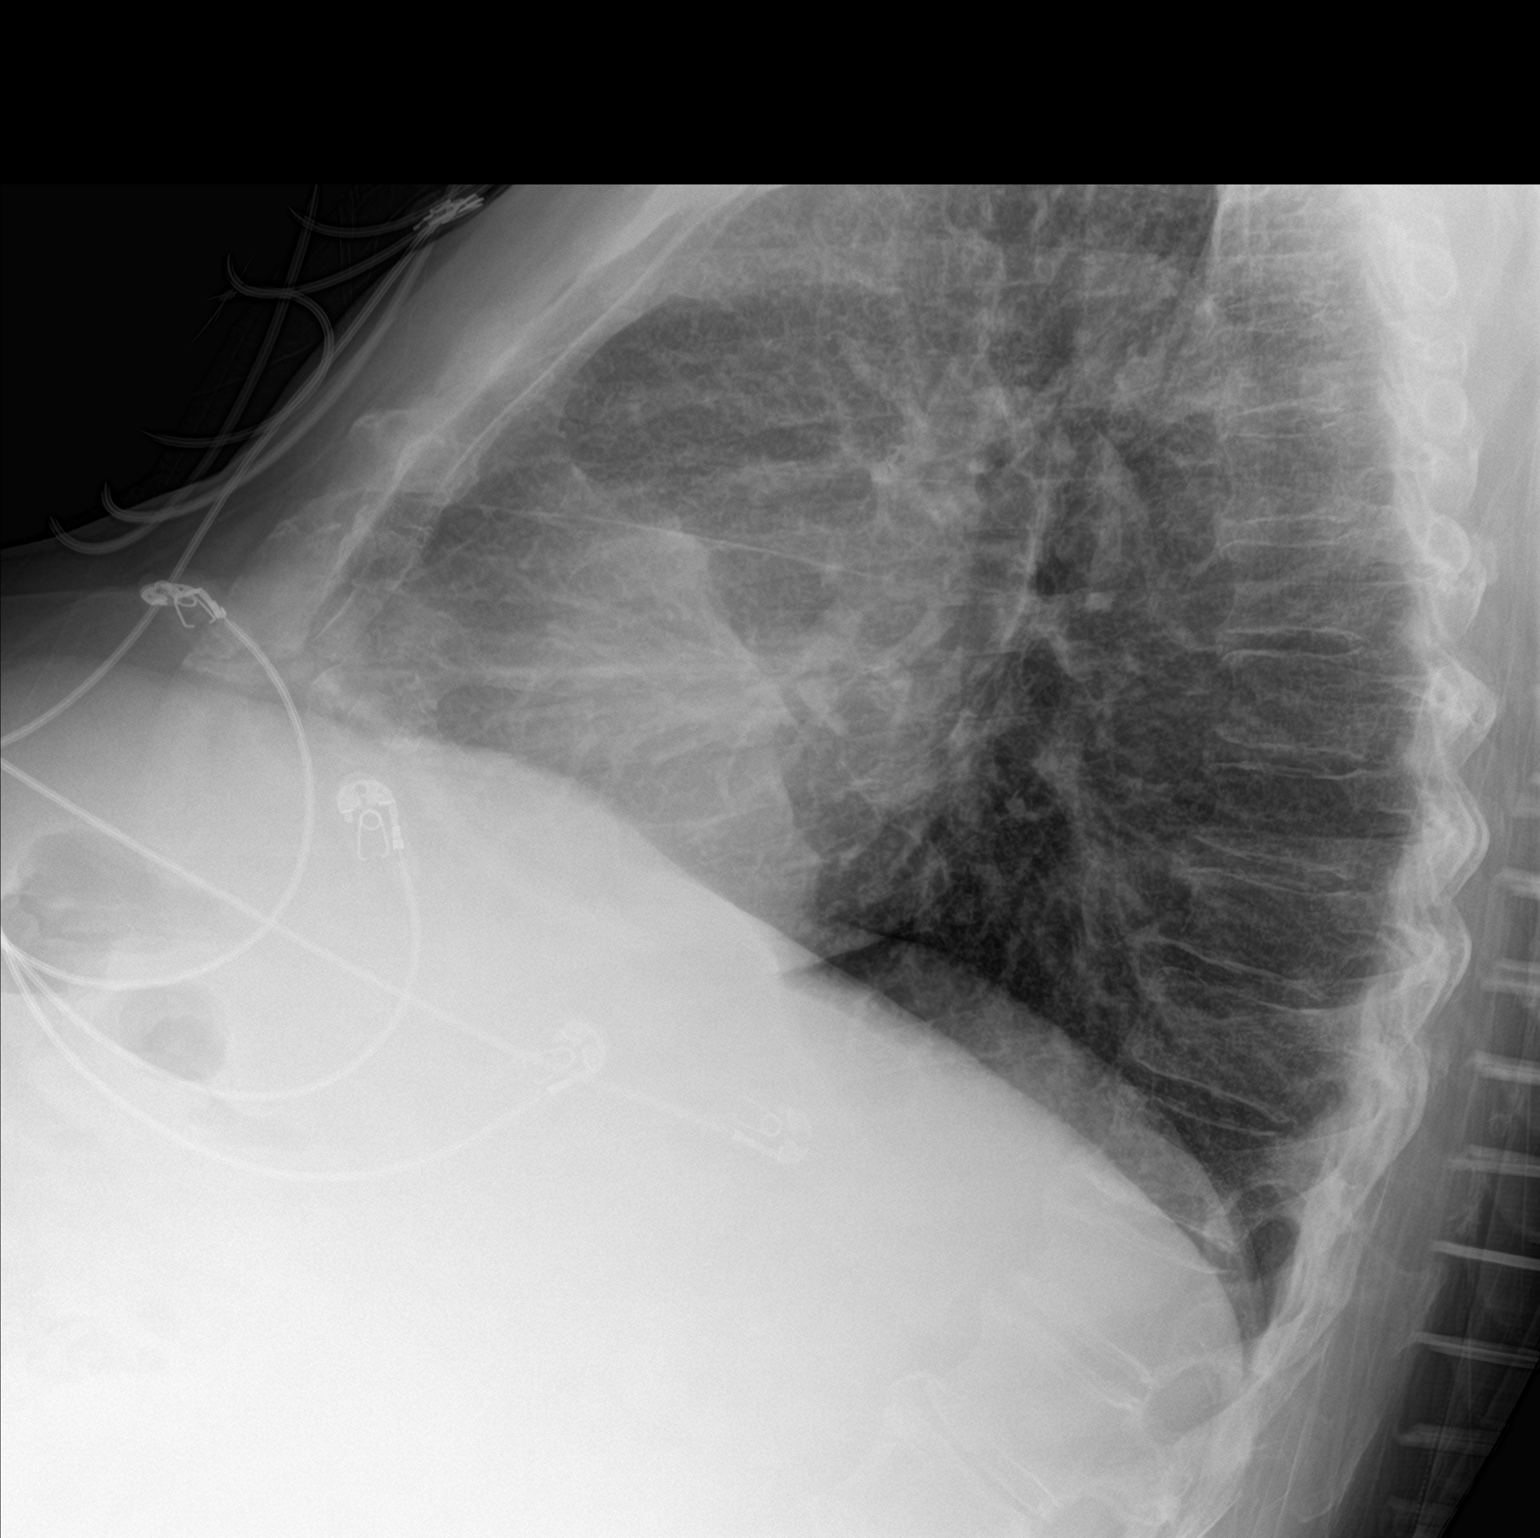

[chest ap]
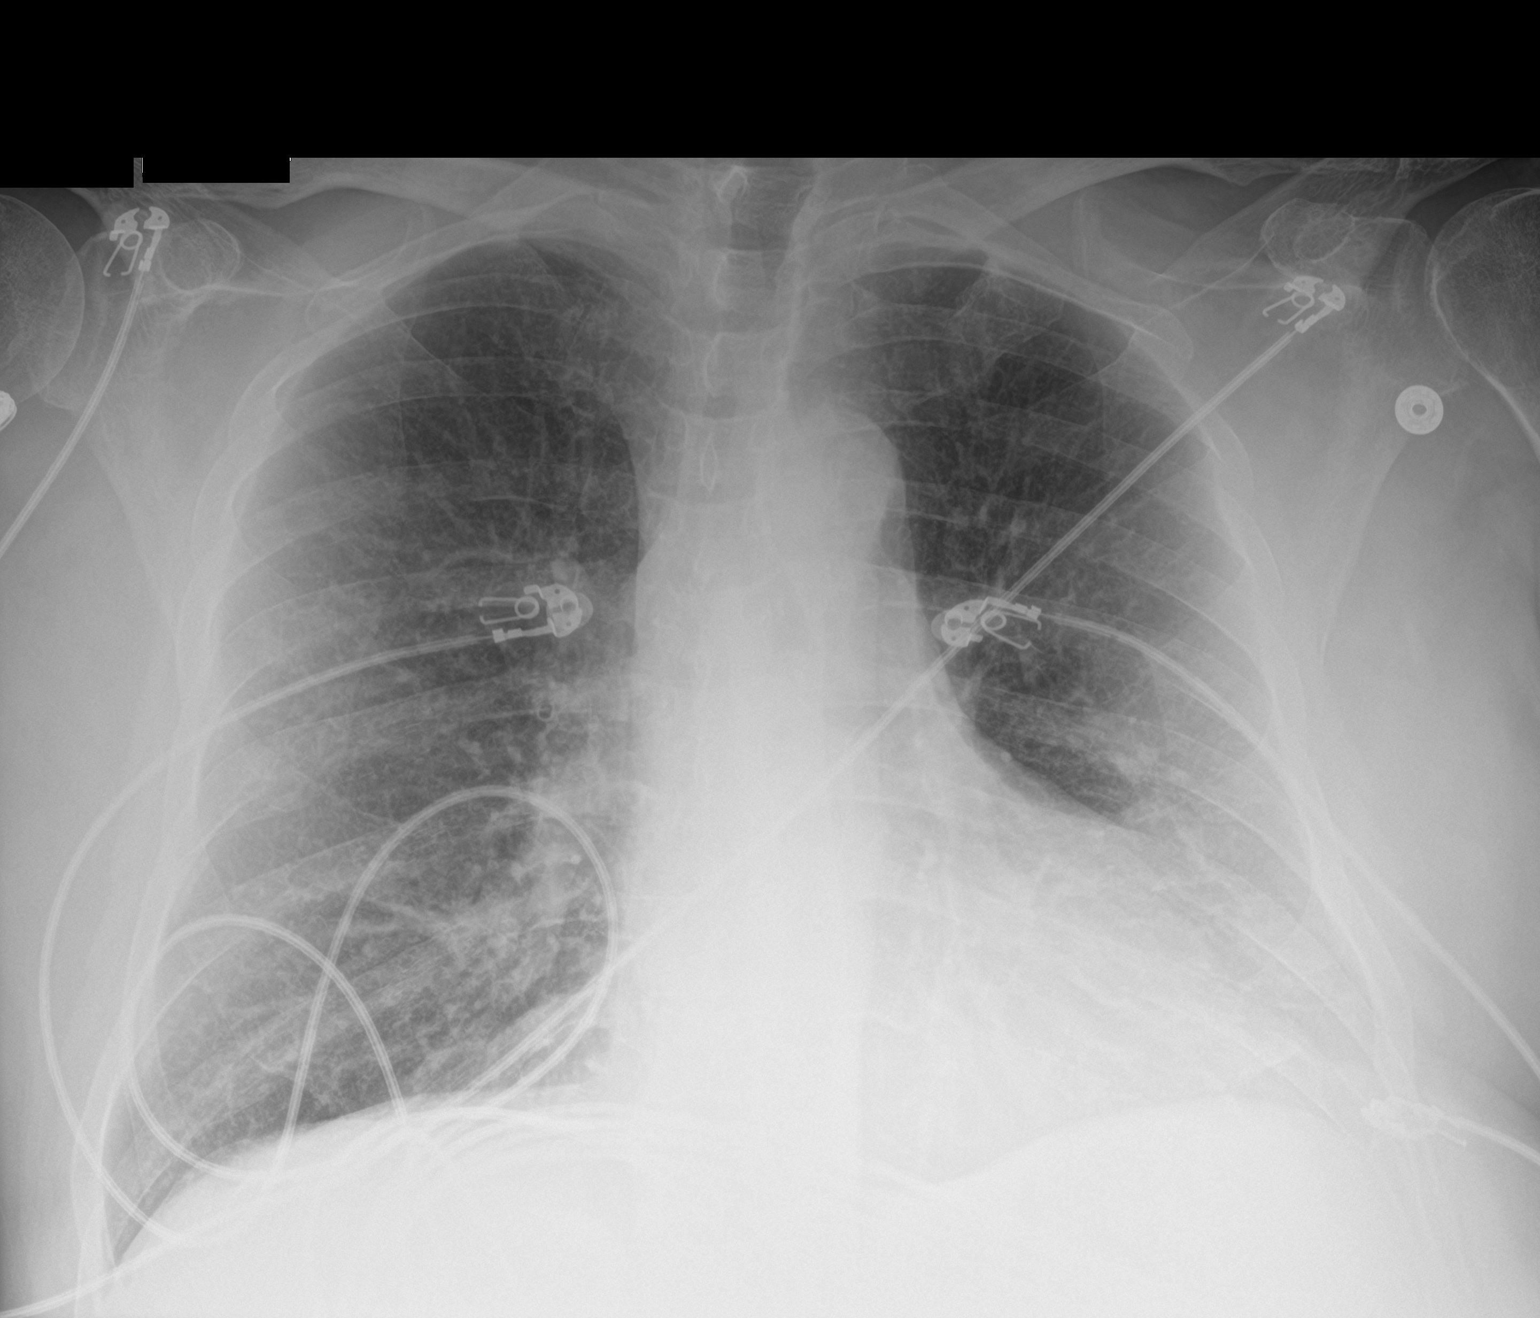

[2 of 2 positions shown; findings below may reference images not displayed]

FINDINGS: Mild, diffuse chronic appearing increased lung markings are seen.
Very mild linear scarring and/or atelectasis is seen along the
periphery of the left lung base. This is present on the prior study.
There is no evidence of acute infiltrate, pleural effusion or
pneumothorax. The cardiac silhouette is mildly enlarged. The
visualized skeletal structures are unremarkable.
IMPRESSION: Chronic appearing increased lung markings without acute or active
cardiopulmonary disease.

## 2019-12-05 IMAGING — CT CT HEAD W/O CM
4 series · 15 of 47 positions shown, 17 images · non-contrast
Comparison: CT and MRI from [[place]]

CLINICAL DATA: Altered mental status, headache and dizziness

EXAM:
CT HEAD WITHOUT CONTRAST
TECHNIQUE: Contiguous axial images were obtained from the base of the skull
through the vertex without intravenous contrast.

[Series 3: head wo · axial · 0.49mm/px · z∈[-169,-39]mm · 7 of 36 slices shown, 9 images]
[im 5/36  brain]
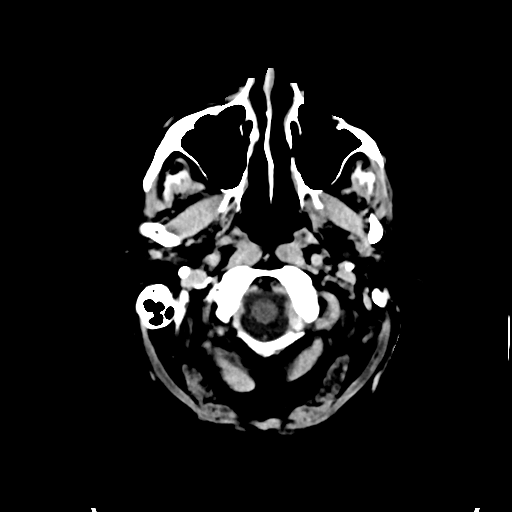
[im 5/36  bone]
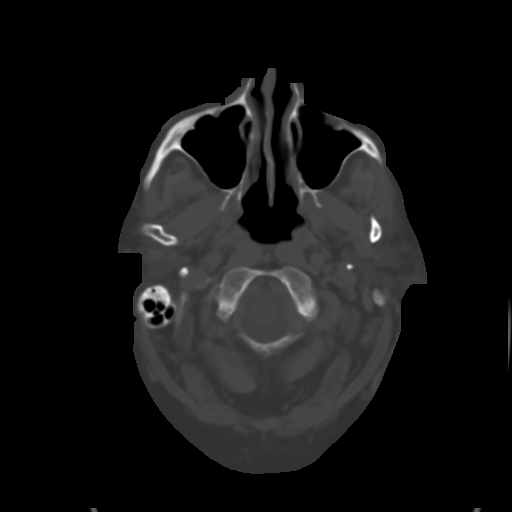
[im 9/36  brain]
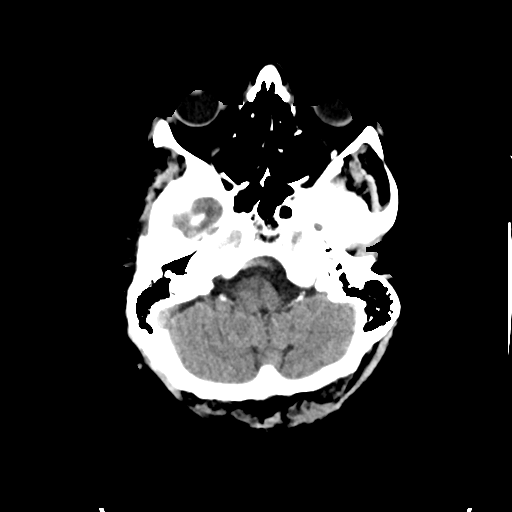
[im 14/36  brain]
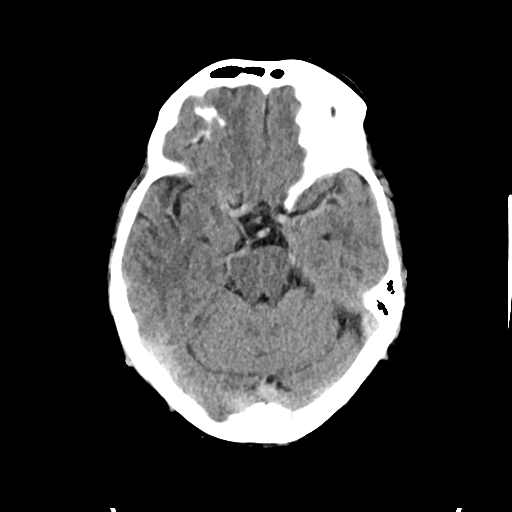
[im 18/36  brain]
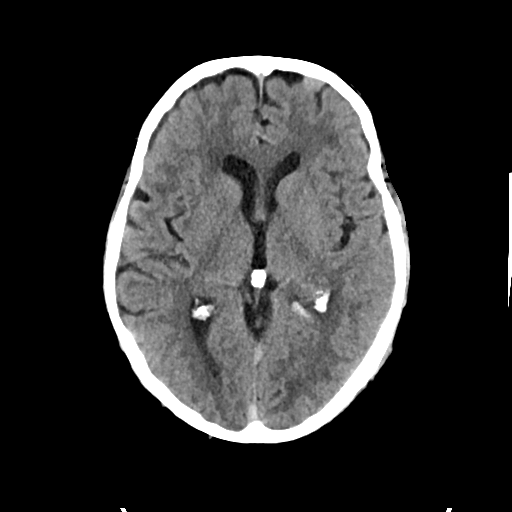
[im 22/36  brain]
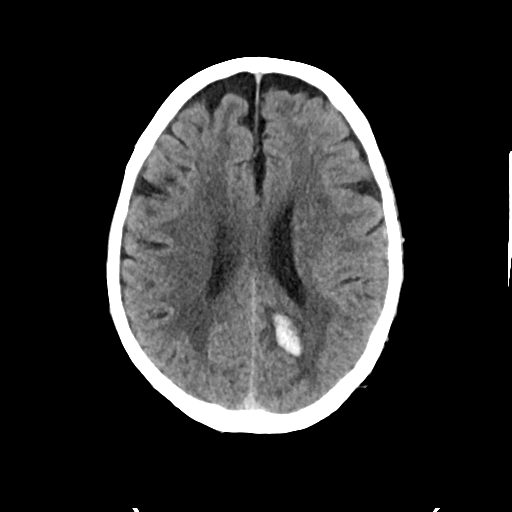
[im 22/36  bone]
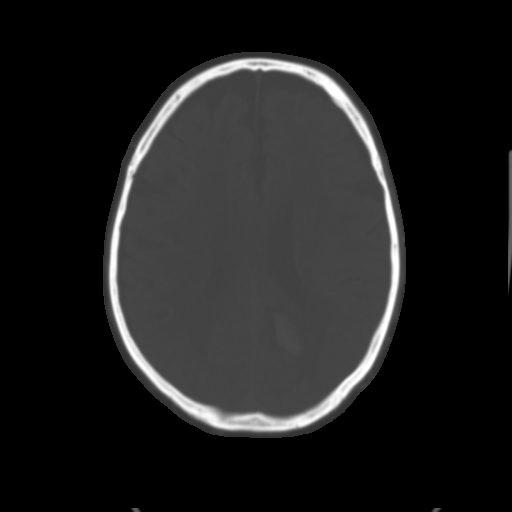
[im 27/36  brain]
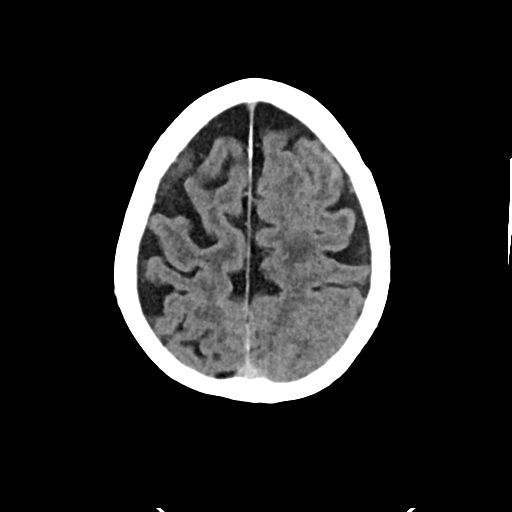
[im 31/36  brain]
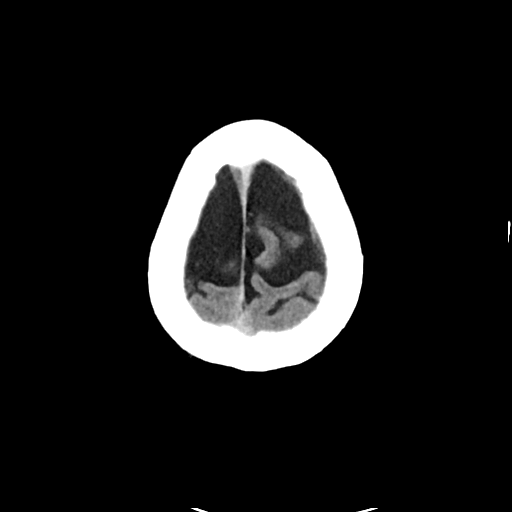

[Series 4: head bone · axial · 0.49mm/px · z∈[-173,-155]mm · 2 of 89 slices shown]
[im 9/89  bone]
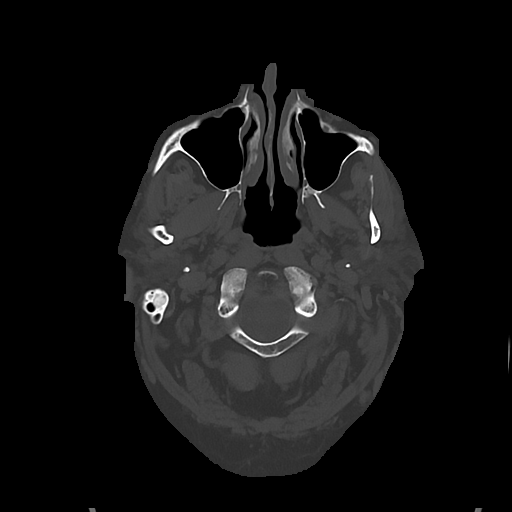
[im 18/89  bone]
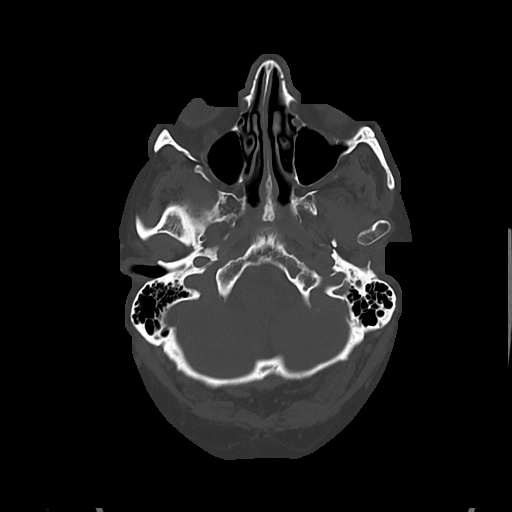

[Series 5: cor soft · coronal · 0.36mm/px · 3 of 96 slices shown]
[im 32/96  brain]
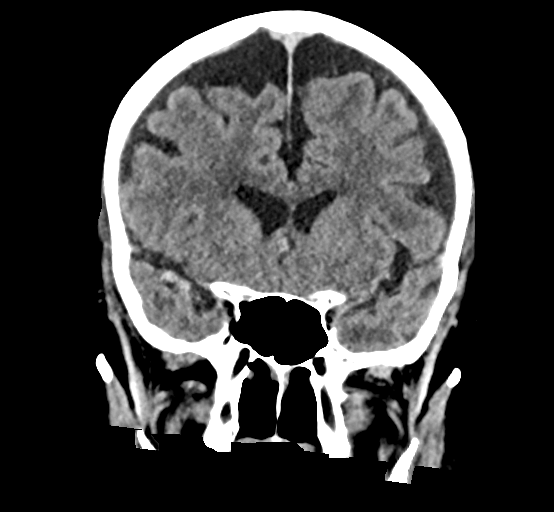
[im 43/96  brain]
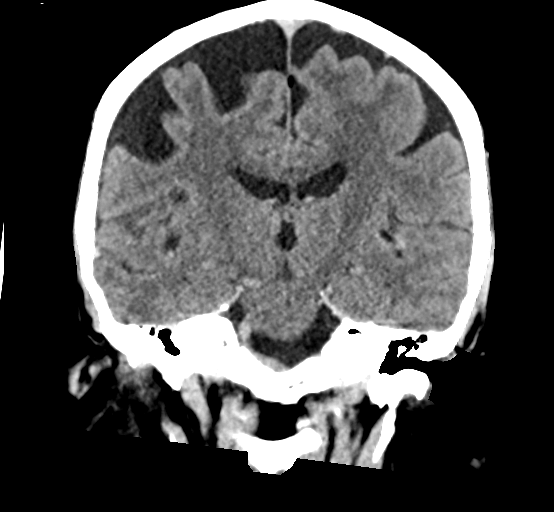
[im 53/96  brain]
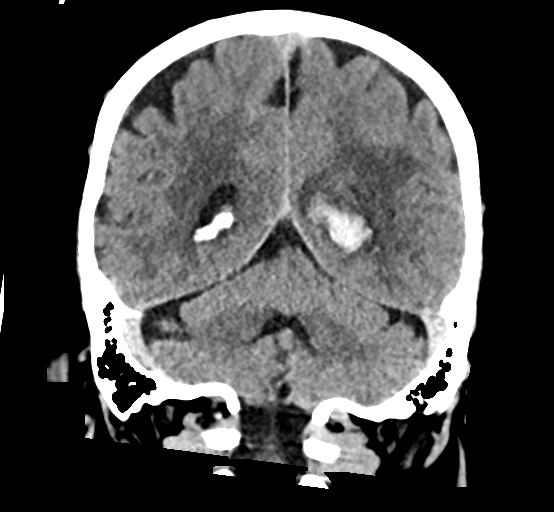

[Series 6: sag soft · sagittal · 0.36mm/px · 3 of 66 slices shown]
[im 22/66  brain]
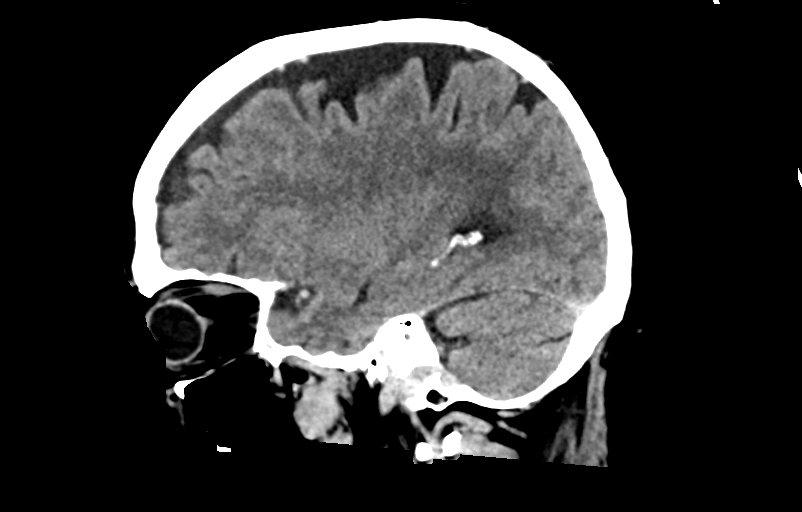
[im 33/66  brain]
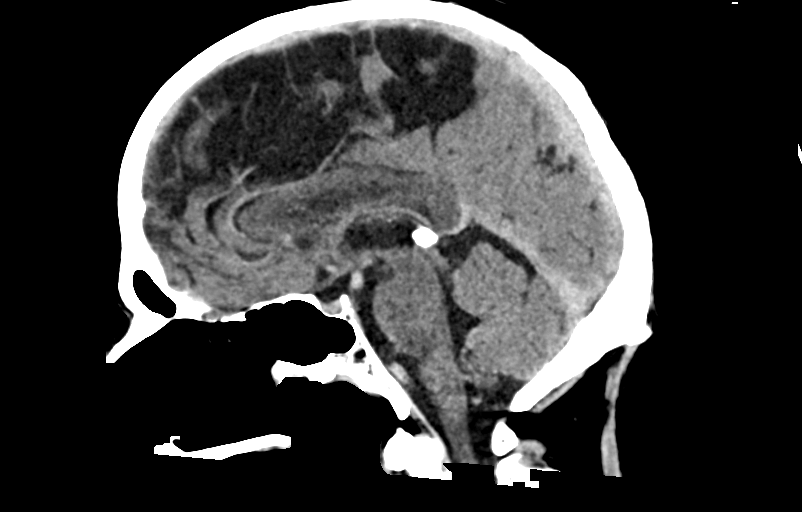
[im 44/66  brain]
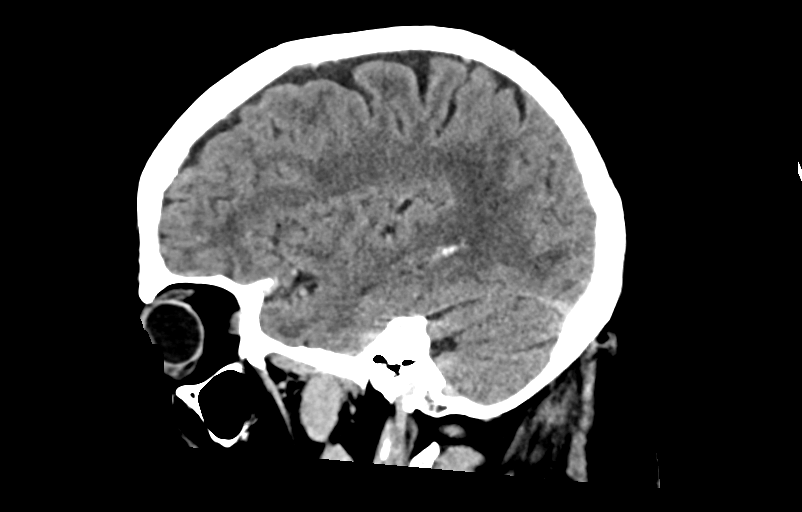

[15 of 47 positions shown; findings below may reference images not displayed]

FINDINGS: Brain: In the left periatrial white matter, there is parenchymal
hemorrhage measuring approximately 3.1 x 2.1 x 1.5 cm with mild
surrounding edema. There is mild mass effect including effacement of
the adjacent posterior left lateral ventricle. No apparent
intraventricular extension at this time.

Gray-white differentiation is preserved. Patchy and confluent areas
of hypoattenuation in the supratentorial white matter are
nonspecific but probably reflect moderate chronic microvascular
ischemic changes.

Vascular: There is atherosclerotic calcification at the skull base.

Skull: Calvarium is unremarkable.

Sinuses/Orbits: No significant finding.

Other: None.
IMPRESSION: Parenchymal hemorrhage in the left periatrial white matter with mild
edema and mass effect. No apparent intraventricular extension at
this time. Of note, there were a few chronic microhemorrhages in
this region on the prior MRI.

These results were called by telephone at the time of interpretation
on [DATE] at [DATE] to provider ALHARBI , who verbally
acknowledged these results.

## 2019-12-05 MED ORDER — ACETAMINOPHEN 325 MG PO TABS
650.0000 mg | ORAL_TABLET | ORAL | Status: DC | PRN
  Administered 2019-12-06 (×3): 650 mg via ORAL
  Filled 2019-12-05 (×4): qty 2

## 2019-12-05 MED ORDER — ONDANSETRON HCL 4 MG/2ML IJ SOLN
4.0000 mg | Freq: Once | INTRAMUSCULAR | Status: AC
  Administered 2019-12-05: 4 mg via INTRAVENOUS
  Filled 2019-12-05: qty 2

## 2019-12-05 MED ORDER — NICARDIPINE HCL IN NACL 20-0.86 MG/200ML-% IV SOLN: 3.0000 mg/h | INTRAVENOUS | Status: DC

## 2019-12-05 MED ORDER — CHLORHEXIDINE GLUCONATE CLOTH 2 % EX PADS
6.0000 | MEDICATED_PAD | Freq: Every day | CUTANEOUS | Status: DC
  Administered 2019-12-05 – 2019-12-06 (×2): 6 via TOPICAL

## 2019-12-05 MED ORDER — INSULIN ASPART 100 UNIT/ML ~~LOC~~ SOLN
0.0000 [IU] | SUBCUTANEOUS | Status: DC
  Administered 2019-12-05 – 2019-12-06 (×2): 2 [IU] via SUBCUTANEOUS
  Administered 2019-12-06: 3 [IU] via SUBCUTANEOUS
  Administered 2019-12-06: 2 [IU] via SUBCUTANEOUS
  Administered 2019-12-06: 5 [IU] via SUBCUTANEOUS

## 2019-12-05 MED ORDER — CLEVIDIPINE BUTYRATE 0.5 MG/ML IV EMUL
0.0000 mg/h | INTRAVENOUS | Status: DC
  Administered 2019-12-05: 1 mg/h via INTRAVENOUS
  Administered 2019-12-05 (×3): 16 mg/h via INTRAVENOUS
  Administered 2019-12-06: 4 mg/h via INTRAVENOUS
  Administered 2019-12-06: 16 mg/h via INTRAVENOUS
  Administered 2019-12-06: 6 mg/h via INTRAVENOUS
  Filled 2019-12-05 (×4): qty 50
  Filled 2019-12-05: qty 100
  Filled 2019-12-05 (×3): qty 50

## 2019-12-05 MED ORDER — LABETALOL HCL 5 MG/ML IV SOLN
10.0000 mg | Freq: Once | INTRAVENOUS | Status: AC
  Administered 2019-12-05: 10 mg via INTRAVENOUS
  Filled 2019-12-05: qty 4

## 2019-12-05 MED ORDER — PANTOPRAZOLE SODIUM 40 MG IV SOLR
40.0000 mg | Freq: Every day | INTRAVENOUS | Status: DC
  Administered 2019-12-05: 40 mg via INTRAVENOUS
  Filled 2019-12-05: qty 40

## 2019-12-05 MED ORDER — STROKE: EARLY STAGES OF RECOVERY BOOK
Freq: Once | Status: AC
  Filled 2019-12-05: qty 1

## 2019-12-05 MED ORDER — SENNOSIDES-DOCUSATE SODIUM 8.6-50 MG PO TABS
1.0000 | ORAL_TABLET | Freq: Two times a day (BID) | ORAL | Status: DC
  Administered 2019-12-05 – 2019-12-07 (×4): 1 via ORAL
  Filled 2019-12-05 (×4): qty 1

## 2019-12-05 MED ORDER — ACETAMINOPHEN 160 MG/5ML PO SOLN: 650.0000 mg | ORAL | Status: DC | PRN

## 2019-12-05 MED ORDER — SODIUM CHLORIDE 0.9 % IV SOLN: INTRAVENOUS | Status: DC

## 2019-12-05 MED ORDER — ACETAMINOPHEN 650 MG RE SUPP: 650.0000 mg | RECTAL | Status: DC | PRN

## 2019-12-05 NOTE — H&P (Signed)
Neurology H&P  CC: Headache  History is obtained from: Patient, daughter  HPI: Matthew [name] is a 74 y.o. male with a history of diabetes, CKD who presents with headache since last night.  Because the headache was severe today, he sought care in the emergency department where family also noted that he had seemed slightly confused.  For this reason, he had a head CT performed which demonstrates a parenchymal hemorrhage on the left.  He was severely hypertensive in the 200s on arrival, therefore a code stroke hemorrhage was activated to assist with blood pressure management.   LKW: 8/1, unclear time tpa given?: No, ICH IR Thrombectomy? No, ICH   ROS: A complete ROS was performed and is negative except as noted in the HPI.    Past Medical History:  Diagnosis Date  . Adenomatous polyps   . Benign neoplasm of colon   . Chronic kidney disease    stage 2  . COPD (chronic obstructive pulmonary disease) ([place])   . Diabetes mellitus without complication ([place])   . External hemorrhoids   . Hemorrhoids   . Thyroid cancer ([place])      Family History  Problem Relation Age of Onset  . Heart attack Father   . Hypertension Father   . Breast cancer Mother   . Hypertension Mother   . Hypertension Brother   . Heart attack Brother   . Heart attack Sister      Social History:  reports that he has been smoking cigarettes. He has been smoking about 1.50 packs per day. He has never used smokeless tobacco. He reports previous alcohol use. He reports that he does not use drugs.   Prior to Admission medications   Medication Sig Start Date End Date Taking? Authorizing Provider  aspirin 81 MG tablet Take 81 mg by mouth daily.     Yes [provider]  divalproex (DEPAKOTE) 125 MG DR tablet Take 125-250 mg by mouth at bedtime as needed (SLEEP MOOD AND MIGRAINE PREVENTION).  12/21/18  Yes [provider]  glipiZIDE (GLUCOTROL XL) 5 MG 24 hr tablet Take 5 mg by mouth daily. 10/23/19  Yes  [provider]  JARDIANCE 10 MG TABS tablet Take 10 mg by mouth daily. 11/18/18  Yes [provider]  levothyroxine (SYNTHROID) 200 MCG tablet Take 300 mcg by mouth daily before breakfast. Take 1 tablet by mouth once daily then take 2 tablets on the same day each week for hypothyroid 09/01/18  Yes [provider]  lisinopril (ZESTRIL) 40 MG tablet Take 1 tablet (40 mg total) by mouth daily. 10/24/18  Yes Asencion Noble, MD  metFORMIN (GLUCOPHAGE) 1000 MG tablet Take 1,000 mg by mouth 2 (two) times a day. 09/01/18  Yes [provider]  blood glucose meter kit and supplies Dispense based on patient and insurance preference. Use up to four times daily as directed. (FOR ICD-10 E10.9, E11.9). Patient not taking: Reported on 12/27/2018 10/24/18   Asencion Noble, MD     Exam: Current vital signs: BP 116/64   Pulse 89   Temp 98.4 F (36.9 C) (Oral)   Resp (!) 24   Ht 5' 11" (1.803 m)   Wt 106.6 kg   SpO2 92%   BMI 32.78 kg/m    Physical Exam  Constitutional: Appears well-developed and well-nourished.  Psych: Affect appropriate to situation Eyes: No scleral injection HENT: No OP obstrucion Head: Normocephalic.  Cardiovascular: Normal rate and regular rhythm.  Respiratory: Effort normal and  breath sounds normal to anterior ascultation GI: Soft.  No distension. There is no tenderness.  Skin: WDI  Neuro: Mental Status: Patient is awake, alert, oriented to person, place, he is unable to give the year, gives the month as "July or August" Patient is able to give a clear and coherent history. No signs of aphasia or neglect Cranial Nerves: II: Visual Fields are full. Pupils are equal, round, and reactive to light.   III,IV, VI: EOMI without ptosis or diplopia.  V: Facial sensation is symmetric to temperature VII: Facial movement is symmetric.  VIII: hearing is intact to voice X: Uvula is midline and palate elevates symmetrically XI: Shoulder shrug  is symmetric. XII: tongue is midline without atrophy or fasciculations.  Motor: Tone is normal. Bulk is normal. 5/5 strength was present in all four extremities.  Sensory: Sensation is symmetric to light touch and temperature in the arms and legs. 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  Possible etiologies include hypertensive, hemorrhagic conversion of ischemic infarct.  I do question whether there might be some subtle hypodensities on the CT scan, which could also be suggestive of hemorrhagic press.  I do think further imaging would be helpful in his case.  Plan: ICH- 1) Admit to ICU 2) no antiplatelets or anticoagulants 3) blood pressure control with goal systolic [phone] 4) Frequent neuro checks 5) If symptoms worsen or there is decreased mental status, repeat stat head CT 6) PT,OT,ST 7) MRI brain  Diabetes- SSI  Hypertensive emergency Cleviprex  COPD Continue to monitor    This patient is critically ill and at significant risk of neurological worsening, death and care requires constant monitoring of vital signs, hemodynamics,respiratory and cardiac monitoring, neurological assessment, discussion with family, other specialists and medical decision making of high complexity. I spent 45 minutes of neurocritical care time  in the care of  this patient. This was time spent independent of any time provided by nurse practitioner or PA.  Roland Rack, MD [facility] [phone]  If 7pm- 7am, please page neurology on call as listed  in [place].

## 2019-12-05 NOTE — ED Notes (Signed)
Pt provided carb modified dinner tray, daughter at bedside

## 2019-12-05 NOTE — ED Triage Notes (Signed)
BIB EMS from home. Per family patient has been "acting funny" X1 day. No clear LKW. Also reported HA/dizziness earlier in day. Patient is alert and oriented to self and place, disoriented to time. Hypertensive.

## 2019-12-05 NOTE — ED Notes (Signed)
Pt returned from MRI at this time. Rehooked to monitoring equipment & medication resumed

## 2019-12-05 NOTE — ED Notes (Addendum)
Pt transported to MRI via stretcher. Clevaprex & Saline paused for exam

## 2019-12-05 NOTE — Progress Notes (Signed)
Called re: intermittent left sided chest pain. Repeat EKG is unchanged, will trend troponin.   Roland Rack, MD [facility] [phone]  If 7pm- 7am, please page neurology on call as listed in [place].

## 2019-12-05 NOTE — ED Provider Notes (Signed)
Emergency Department Provider Note  I have reviewed the triage vital signs and the nursing notes.  HISTORY  Chief Complaint Altered Mental Status   HPI Matthew [name] is a 74 y.o. male with medical problems document below to include a progressively worsening memory loss for the last year at least but has not been followed for because he does not like to go to the doctor.  The history is supplied by one daughter who was not there, EMS who received report from the patient's girlfriend that he lives with and his other daughter who is also not there.  Sounds at the patient had a headache yesterday and has been not been having aches recently so he took the Depakote which is one of his as needed medications for headache.  He has subsequently went to sleep was.  He had take some Tylenol earlier but they do not know how much.  Sounds like today he was acting abnormal but do not know how and then EMS was called.  His blood sugar was 120 with them of 68 prior to their arrival.  Blood pressure was consistently over 017 systolic he was complained of a headache and had episodes of vomiting as well.  EMS brought him here and at this time he is alert oriented to self only.   Has no complaints at this time as far as headache, nausea, abdominal pain or other symptoms.  No other associated or modifying symptoms.    Past Medical History:  Diagnosis Date  . Adenomatous polyps   . Benign neoplasm of colon   . Chronic kidney disease    stage 2  . COPD (chronic obstructive pulmonary disease) ([place])   . Diabetes mellitus without complication ([place])   . External hemorrhoids   . Hemorrhoids   . Thyroid cancer [facility])     Patient Active Problem List   Diagnosis Date Noted  . ICH (intracerebral hemorrhage) ([place]) 12/05/2019  . Encephalopathy acute 10/19/2018    Past Surgical History:  Procedure Laterality Date  . THYROIDECTOMY        Allergies Patient has no known allergies.  Family History  Problem  Relation Age of Onset  . Heart attack Father   . Hypertension Father   . Breast cancer Mother   . Hypertension Mother   . Hypertension Brother   . Heart attack Brother   . Heart attack Sister     Social History Social History   Tobacco Use  . Smoking status: Current Every Day Smoker    Packs/day: 1.50    Types: Cigarettes  . Smokeless tobacco: Never Used  . Tobacco comment: Counseling sheet given in exam room for smoking   Vaping Use  . Vaping Use: Unknown  Substance Use Topics  . Alcohol use: Not Currently  . Drug use: No    Review of Systems  All other systems negative except as documented in the HPI. All pertinent positives and negatives as reviewed in the HPI. ____________________________________________  PHYSICAL EXAM:  VITAL SIGNS: ED Triage Vitals [12/05/19 1522]  Enc Vitals Group     BP (!) 200/105     Pulse Rate (!) 105     Resp (!) 22     Temp 98.4 F (36.9 C)     Temp Source Oral     SpO2 97 %     Weight 235 lb 0.2 oz (106.6 kg)     Height 5\' 11"  (1.803 m)    Constitutional: Alert and oriented. Well  appearing and in no acute distress. Eyes: Conjunctivae are normal. PERRL. EOMI. Head: Atraumatic. Nose: No congestion/rhinnorhea. Mouth/Throat: Mucous membranes are moist.  Oropharynx non-erythematous. Neck: No stridor.  No meningeal signs.   Cardiovascular: Normal rate, regular rhythm. Good peripheral circulation. Grossly normal heart sounds.   Respiratory: Normal respiratory effort.  No retractions. Lungs CTAB. Gastrointestinal: Soft and nontender. No distention.  Musculoskeletal: No lower extremity tenderness nor edema. No gross deformities of extremities. Neurologic:  Normal speech and language.  Has no memory for today.  Has equal grip strength, bicep flexion, tricep extension, shoulder shrug, facial nerve strength, sensation in his 3 trigeminal nerves, extraocular movements are intact, visual fields are grossly intact, tongue protrudes midline,  bilateral lower extremities with symmetric strength with dorsiflexion and plantarflexion of feet and sensation to light touch. Skin:  Skin is warm, dry and intact. No rash noted.  ____________________________________________   LABS (all labs ordered are listed, but only abnormal results are displayed)  Labs Reviewed  CBC - Abnormal; Notable for the following components:      Result Value   WBC 11.9 (*)    RBC 6.27 (*)    Hemoglobin 19.0 (*)    HCT 55.7 (*)    All other components within normal limits  DIFFERENTIAL - Abnormal; Notable for the following components:   Abs Immature Granulocytes 0.08 (*)    All other components within normal limits  COMPREHENSIVE METABOLIC PANEL - Abnormal; Notable for the following components:   Glucose, Bld 141 (*)    Albumin 3.4 (*)    All other components within normal limits  URINALYSIS, ROUTINE W REFLEX MICROSCOPIC - Abnormal; Notable for the following components:   Glucose, UA >=500 (*)    Hgb urine dipstick SMALL (*)    Protein, ur >=300 (*)    All other components within normal limits  VALPROIC ACID LEVEL - Abnormal; Notable for the following components:   Valproic Acid Lvl <10 (*)    All other components within normal limits  HEMOGLOBIN A1C - Abnormal; Notable for the following components:   Hgb A1c MFr Bld 7.3 (*)    All other components within normal limits  GLUCOSE, CAPILLARY - Abnormal; Notable for the following components:   Glucose-Capillary 153 (*)    All other components within normal limits  GLUCOSE, CAPILLARY - Abnormal; Notable for the following components:   Glucose-Capillary 145 (*)    All other components within normal limits  GLUCOSE, CAPILLARY - Abnormal; Notable for the following components:   Glucose-Capillary 139 (*)    All other components within normal limits  LIPID PANEL - Abnormal; Notable for the following components:   Triglycerides 948 (*)    HDL 28 (*)    All other components within normal limits    GLUCOSE, CAPILLARY - Abnormal; Notable for the following components:   Glucose-Capillary 153 (*)    All other components within normal limits  GLUCOSE, CAPILLARY - Abnormal; Notable for the following components:   Glucose-Capillary 145 (*)    All other components within normal limits  GLUCOSE, CAPILLARY - Abnormal; Notable for the following components:   Glucose-Capillary 139 (*)    All other components within normal limits  GLUCOSE, CAPILLARY - Abnormal; Notable for the following components:   Glucose-Capillary 210 (*)    All other components within normal limits  GLUCOSE, CAPILLARY - Abnormal; Notable for the following components:   Glucose-Capillary 154 (*)    All other components within normal limits  GLUCOSE, CAPILLARY - Abnormal; Notable  for the following components:   Glucose-Capillary 160 (*)    All other components within normal limits  I-STAT CHEM 8, ED - Abnormal; Notable for the following components:   Glucose, Bld 142 (*)    Hemoglobin 19.0 (*)    HCT 56.0 (*)    All other components within normal limits  CBG MONITORING, ED - Abnormal; Notable for the following components:   Glucose-Capillary 192 (*)    All other components within normal limits  SARS CORONAVIRUS 2 BY RT PCR (HOSPITAL ORDER, [place] [facility])  MRSA PCR SCREENING  ETHANOL  PROTIME-INR  APTT  RAPID URINE DRUG SCREEN, HOSP PERFORMED  ACETAMINOPHEN LEVEL  LDL CHOLESTEROL, DIRECT  TROPONIN I (HIGH SENSITIVITY)  TROPONIN I (HIGH SENSITIVITY)   ____________________________________________  EKG   EKG Interpretation  Date/Time:  Monday December 05 2019 19:20:34 EDT Ventricular Rate:  91 PR Interval:    QRS Duration: 94 QT Interval:  397 QTC Calculation: 489 R Axis:   107 Text Interpretation: Sinus rhythm Right axis deviation Low voltage, precordial leads Probable anteroseptal infarct, old Nonspecific T abnormalities, lateral leads No acute changes No significant change since  last tracing Confirmed by Varney Biles [id number]) on 12/06/2019 10:49:51 AM       ____________________________________________  RADIOLOGY  CT HEAD WO CONTRAST  Result Date: 12/06/2019 CLINICAL DATA:  Stroke follow-up EXAM: CT HEAD WITHOUT CONTRAST TECHNIQUE: Contiguous axial images were obtained from the base of the skull through the vertex without intravenous contrast. COMPARISON:  None. FINDINGS: Brain: Unchanged size of intraparenchymal hematoma centered in the left parietal lobe. Unchanged mild surrounding edema. There is periventricular hypoattenuation compatible with chronic microvascular disease. Vascular: No hyperdense vessel or unexpected calcification. Skull: Normal. Negative for fracture or focal lesion. Sinuses/Orbits: No acute finding. Other: None. IMPRESSION: Unchanged size of intraparenchymal hematoma centered in the left parietal lobe. Electronically Signed   By: Ulyses Jarred M.D.   On: 12/06/2019 03:21   CT ANGIO NECK W OR WO CONTRAST  Result Date: 12/06/2019 CLINICAL DATA:  Neuro deficit, acute, stroke suspected. EXAM: CT ANGIOGRAPHY HEAD AND NECK TECHNIQUE: Multidetector CT imaging of the head and neck was performed using the standard protocol during bolus administration of intravenous contrast. Multiplanar CT image reconstructions and MIPs were obtained to evaluate the vascular anatomy. Carotid stenosis measurements (when applicable) are obtained utilizing NASCET criteria, using the distal internal carotid diameter as the denominator. CONTRAST:  136mL OMNIPAQUE IOHEXOL 350 MG/ML SOLN COMPARISON:  None. FINDINGS: CTA NECK FINDINGS Aortic arch: 3 vessel arch configuration is present. Atherosclerotic changes are present. No significant aneurysm or stenosis is present. Right carotid system: The right common carotid artery is within normal limits. Atherosclerotic changes are present at the bifurcation without significant stenosis. Mild tortuosity is present in distal cervical right ICA  without significant stenosis. Left carotid system: Focal tortuosity is present in the cervical left ICA with less than 50% stenosis. Atherosclerotic calcifications are present at the bifurcation and proximal left ICA without significant stenosis. Cervical ICA is otherwise normal. Vertebral arteries: Left vertebral artery is the dominant vessel. Both vertebral arteries originate at the subclavian without significant stenosis. Skeleton: Focal degenerative endplate changes present at C5-6. Facet degenerative changes are worse on the left at C3-4 and C4-5. Vertebral body heights are normal. Alignment is anatomic. No focal lytic or blastic lesions are present. The patient is edentulous. Other neck: Soft tissues the neck are otherwise unremarkable. Upper chest: Centrilobular emphysematous changes are present at the lung apices. No focal  nodule mass, or airspace disease is present. Thoracic inlet is within normal limits. Review of the MIP images confirms the above findings CTA HEAD FINDINGS Anterior circulation: Atherosclerotic calcifications are present within the cavernous internal carotid arteries bilaterally. Mild narrowing of less than 50% is present in the left supraclinoid ICA. ICA termini are normal bilaterally. The A1 and M1 segments are normal. The left A1 segment is dominant. The anterior communicating artery is patent. ACA and MCA branch vessels are within normal limits. Posterior circulation: Left vertebral artery is dominant. PICA origins are visualized and normal. The vertebrobasilar junction is normal. The basilar artery is normal. Both posterior cerebral arteries originate from basilar tip. The PCA branch vessels are within normal limits. Focal lesion is associated with the hemorrhage. Venous sinuses: The dural sinuses are patent. Straight sinus and deep cerebral veins are within normal limits. Cortical veins are unremarkable. No vascular malformation is evident. Anatomic variants: None Review of the MIP  images confirms the above findings IMPRESSION: 1. No vascular malformation associated with the hemorrhage. 2. Atherosclerotic changes at the carotid bifurcations bilaterally and cavernous internal carotid arteries bilaterally without significant stenosis. 3. Mild narrowing of less than 50% in the left supraclinoid ICA. 4. No other significant proximal stenosis, aneurysm, or branch vessel occlusion within the Circle of Willis. 5. Multilevel spondylosis of the cervical spine. 6. Aortic Atherosclerosis (ICD10-I70.0) and Emphysema (ICD10-J43.9). Electronically Signed   By: San Morelle M.D.   On: 12/06/2019 11:20   ECHOCARDIOGRAM COMPLETE  Result Date: 12/06/2019    ECHOCARDIOGRAM REPORT   Patient Name:   Matthew [name] Date of Exam: 12/06/2019 Medical Rec #:  [id number]     Height:       71.0 in Accession #:    [id number]    Weight:       243.4 lb Date of Birth:  04-21-1946    BSA:          2.292 m Patient Age:    65 years      BP:           134/53 mmHg Patient Gender: M             HR:           84 bpm. Exam Location:  Inpatient Procedure: 2D Echo, Cardiac Doppler, Color Doppler and Intracardiac            Opacification Agent Indications:    Stroke 434.91 / I163.9  History:        Patient has no prior history of Echocardiogram examinations.                 COPD; Risk Factors:Diabetes and Current Smoker.  Sonographer:    Vickie Epley RDCS Referring Phys: [id number] [place]  1. Left ventricular ejection fraction, by estimation, is 60 to 65%. The left ventricle has normal function. The left ventricle has no regional wall motion abnormalities. Left ventricular diastolic parameters are consistent with Grade I diastolic dysfunction (impaired relaxation).  2. Right ventricular systolic function is normal. The right ventricular size is normal.  3. Left atrial size was mildly dilated.  4. The mitral valve is normal in structure. No evidence of mitral valve regurgitation. No evidence of mitral stenosis.  5.  The aortic valve is normal in structure. Aortic valve regurgitation is trivial. No aortic stenosis is present.  6. The inferior vena cava is dilated in size with >50% respiratory variability, suggesting right atrial pressure of 8 mmHg. FINDINGS  Left  Ventricle: Left ventricular ejection fraction, by estimation, is 60 to 65%. The left ventricle has normal function. The left ventricle has no regional wall motion abnormalities. Definity contrast agent was given IV to delineate the left ventricular  endocardial borders. The left ventricular internal cavity size was normal in size. There is no left ventricular hypertrophy. Left ventricular diastolic parameters are consistent with Grade I diastolic dysfunction (impaired relaxation). Indeterminate filling pressures. Right Ventricle: The right ventricular size is normal. No increase in right ventricular wall thickness. Right ventricular systolic function is normal. Left Atrium: Left atrial size was mildly dilated. Right Atrium: Right atrial size was normal in size. Pericardium: There is no evidence of pericardial effusion. Mitral Valve: The mitral valve is normal in structure. Normal mobility of the mitral valve leaflets. No evidence of mitral valve regurgitation. No evidence of mitral valve stenosis. Tricuspid Valve: The tricuspid valve is normal in structure. Tricuspid valve regurgitation is not demonstrated. No evidence of tricuspid stenosis. Aortic Valve: The aortic valve is normal in structure. Aortic valve regurgitation is trivial. No aortic stenosis is present. Pulmonic Valve: The pulmonic valve was normal in structure. Pulmonic valve regurgitation is not visualized. No evidence of pulmonic stenosis. Aorta: The aortic root is normal in size and structure. Venous: The inferior vena cava is dilated in size with greater than 50% respiratory variability, suggesting right atrial pressure of 8 mmHg. IAS/Shunts: No atrial level shunt detected by color flow Doppler.  LEFT  VENTRICLE PLAX 2D LVIDd:         5.00 cm      Diastology LVIDs:         3.80 cm      LV e' lateral:   6.31 cm/s LV PW:         0.80 cm      LV E/e' lateral: 10.7 LV IVS:        0.80 cm      LV e' medial:    4.79 cm/s LVOT diam:     2.10 cm      LV E/e' medial:  14.1 LV SV:         72 LV SV Index:   31 LVOT Area:     3.46 cm  LV Volumes (MOD) LV vol d, MOD A2C: 121.0 ml LV vol d, MOD A4C: 101.0 ml LV vol s, MOD A2C: 48.4 ml LV vol s, MOD A4C: 36.6 ml LV SV MOD A2C:     72.6 ml LV SV MOD A4C:     101.0 ml LV SV MOD BP:      68.3 ml RIGHT VENTRICLE RV S prime:     11.00 cm/s TAPSE (M-mode): 1.8 cm LEFT ATRIUM             Index       RIGHT ATRIUM           Index LA diam:        4.20 cm 1.83 cm/m  RA Area:     12.50 cm LA Vol (A2C):   26.8 ml 11.69 ml/m RA Volume:   26.70 ml  11.65 ml/m LA Vol (A4C):   36.2 ml 15.79 ml/m LA Biplane Vol: 32.8 ml 14.31 ml/m  AORTIC VALVE LVOT Vmax:   113.00 cm/s LVOT Vmean:  76.100 cm/s LVOT VTI:    0.208 m  AORTA Ao Root diam: 3.50 cm MITRAL VALVE MV Area (PHT): 4.21 cm    SHUNTS MV Decel Time: 180 msec    Systemic VTI:  0.21  m MV E velocity: 67.70 cm/s  Systemic Diam: 2.10 cm MV A velocity: 86.50 cm/s MV E/A ratio:  0.78 Mihai Croitoru MD Electronically signed by Sanda Klein MD Signature Date/Time: 12/06/2019/3:09:51 PM    Final    CT ANGIO HEAD CODE STROKE  Result Date: 12/06/2019 CLINICAL DATA:  Neuro deficit, acute, stroke suspected. EXAM: CT ANGIOGRAPHY HEAD AND NECK TECHNIQUE: Multidetector CT imaging of the head and neck was performed using the standard protocol during bolus administration of intravenous contrast. Multiplanar CT image reconstructions and MIPs were obtained to evaluate the vascular anatomy. Carotid stenosis measurements (when applicable) are obtained utilizing NASCET criteria, using the distal internal carotid diameter as the denominator. CONTRAST:  158mL OMNIPAQUE IOHEXOL 350 MG/ML SOLN COMPARISON:  None. FINDINGS: CTA NECK FINDINGS Aortic arch: 3  vessel arch configuration is present. Atherosclerotic changes are present. No significant aneurysm or stenosis is present. Right carotid system: The right common carotid artery is within normal limits. Atherosclerotic changes are present at the bifurcation without significant stenosis. Mild tortuosity is present in distal cervical right ICA without significant stenosis. Left carotid system: Focal tortuosity is present in the cervical left ICA with less than 50% stenosis. Atherosclerotic calcifications are present at the bifurcation and proximal left ICA without significant stenosis. Cervical ICA is otherwise normal. Vertebral arteries: Left vertebral artery is the dominant vessel. Both vertebral arteries originate at the subclavian without significant stenosis. Skeleton: Focal degenerative endplate changes present at C5-6. Facet degenerative changes are worse on the left at C3-4 and C4-5. Vertebral body heights are normal. Alignment is anatomic. No focal lytic or blastic lesions are present. The patient is edentulous. Other neck: Soft tissues the neck are otherwise unremarkable. Upper chest: Centrilobular emphysematous changes are present at the lung apices. No focal nodule mass, or airspace disease is present. Thoracic inlet is within normal limits. Review of the MIP images confirms the above findings CTA HEAD FINDINGS Anterior circulation: Atherosclerotic calcifications are present within the cavernous internal carotid arteries bilaterally. Mild narrowing of less than 50% is present in the left supraclinoid ICA. ICA termini are normal bilaterally. The A1 and M1 segments are normal. The left A1 segment is dominant. The anterior communicating artery is patent. ACA and MCA branch vessels are within normal limits. Posterior circulation: Left vertebral artery is dominant. PICA origins are visualized and normal. The vertebrobasilar junction is normal. The basilar artery is normal. Both posterior cerebral arteries  originate from basilar tip. The PCA branch vessels are within normal limits. Focal lesion is associated with the hemorrhage. Venous sinuses: The dural sinuses are patent. Straight sinus and deep cerebral veins are within normal limits. Cortical veins are unremarkable. No vascular malformation is evident. Anatomic variants: None Review of the MIP images confirms the above findings IMPRESSION: 1. No vascular malformation associated with the hemorrhage. 2. Atherosclerotic changes at the carotid bifurcations bilaterally and cavernous internal carotid arteries bilaterally without significant stenosis. 3. Mild narrowing of less than 50% in the left supraclinoid ICA. 4. No other significant proximal stenosis, aneurysm, or branch vessel occlusion within the Circle of Willis. 5. Multilevel spondylosis of the cervical spine. 6. Aortic Atherosclerosis (ICD10-I70.0) and Emphysema (ICD10-J43.9). Electronically Signed   By: San Morelle M.D.   On: 12/06/2019 11:20   ____________________________________________  PROCEDURES  Procedure(s) performed:   Procedures ____________________________________________  INITIAL IMPRESSION / ASSESSMENT AND PLAN / ED COURSE   This patient presents to the ED for concern of change in mental status, this involves an extensive number of treatment options, and  is a complaint that carries with it a high risk of complications and morbidity.  The differential diagnosis includes worsening of his undiagnosed dementia, hemorrhagic stroke secondary to high blood pressure especially with association of vomiting, urinary tract infection or other infectious cause, metabolic abnormalities such as elevated uremia. Also consider toxic metabolic derangements such as tylenol or depakote.     Lab Tests:   I Ordered, reviewed, and interpreted labs, which included cbc, cmp, blood gas, urinalysis.    Medicines ordered:   I ordered medication labetalol and subsequently nicardipine  For  hypertension in setting of IPH.    Imaging Studies ordered:   I independently visualized and interpreted imaging CT head wo which showed IPH and a cxr which showed no significant abnormalities  Additional history obtained:   Additional history obtained from daughters  Previous records obtained and reviewed in epic  Consultations Obtained:   I consulted neurology  and discussed lab and imaging findings and admission  Reevaluation:  After the interventions stated above, I reevaluated the patient and found improved BP. Stable mental status and neuro exam.  ____________________________________________  FINAL CLINICAL IMPRESSION(S) / ED DIAGNOSES  Final diagnoses:  None    MEDICATIONS GIVEN DURING THIS VISIT:  Medications  clevidipine (CLEVIPREX) infusion 0.5 mg/mL (0 mg/hr Intravenous Stopped 12/06/19 1630)  acetaminophen (TYLENOL) tablet 650 mg (650 mg Oral Given 12/06/19 1525)    Or  acetaminophen (TYLENOL) 160 MG/5ML solution 650 mg ( Per Tube See Alternative 12/06/19 1525)    Or  acetaminophen (TYLENOL) suppository 650 mg ( Rectal See Alternative 12/06/19 1525)  senna-docusate (Senokot-S) tablet 1 tablet (1 tablet Oral Given 12/06/19 2130)  0.9 %  sodium chloride infusion ( Intravenous Stopped 12/06/19 1503)  Chlorhexidine Gluconate Cloth 2 % PADS 6 each (6 each Topical Given 12/06/19 1514)  MEDLINE mouth rinse (15 mLs Mouth Rinse Given 12/06/19 2215)  lisinopril (ZESTRIL) tablet 40 mg (40 mg Oral Given 12/06/19 0958)  glipiZIDE (GLUCOTROL XL) 24 hr tablet 5 mg (5 mg Oral Given 12/06/19 1125)  amLODipine (NORVASC) tablet 5 mg (5 mg Oral Given 12/06/19 0958)  labetalol (NORMODYNE) injection 10-40 mg (20 mg Intravenous Given 12/06/19 2245)  hydrALAZINE (APRESOLINE) injection 10 mg (has no administration in time range)  nicotine (NICODERM CQ - dosed in mg/24 hours) patch 14 mg (14 mg Transdermal Patch Applied 12/06/19 1221)  pantoprazole (PROTONIX) EC tablet 40 mg (40 mg Oral Given 12/06/19 2130)    perflutren lipid microspheres (DEFINITY) IV suspension (3 mLs Intravenous Given 12/06/19 1340)  insulin aspart (novoLOG) injection 0-15 Units (3 Units Subcutaneous Given 12/06/19 2239)  labetalol (NORMODYNE) injection 10 mg (10 mg Intravenous Given 12/05/19 1645)  ondansetron (ZOFRAN) injection 4 mg (4 mg Intravenous Given 12/05/19 1703)   stroke: mapping our early stages of recovery book ( Does not apply Given 12/06/19 0958)  iohexol (OMNIPAQUE) 350 MG/ML injection 100 mL (100 mLs Intravenous Contrast Given 12/06/19 1059)  diphenhydrAMINE (BENADRYL) injection 25 mg (25 mg Intravenous Given 12/06/19 2130)    NEW OUTPATIENT MEDICATIONS STARTED DURING THIS VISIT:  Current Discharge Medication List      Note:  This note was prepared with assistance of Dragon voice recognition software. Occasional wrong-word or sound-a-like substitutions may have occurred due to the inherent limitations of voice recognition software.  Merrily Pew, MD 12/06/19 2330

## 2019-12-06 ENCOUNTER — Other Ambulatory Visit: Payer: Self-pay

## 2019-12-06 ENCOUNTER — Inpatient Hospital Stay (HOSPITAL_COMMUNITY): Payer: Medicare Other

## 2019-12-06 DIAGNOSIS — I6389 Other cerebral infarction: Secondary | ICD-10-CM

## 2019-12-06 LAB — ECHOCARDIOGRAM COMPLETE
Area-P 1/2: 4.21 cm2
Calc EF: 61.4 %
Height: 70.984 in
S' Lateral: 3.8 cm
Single Plane A2C EF: 60 %
Single Plane A4C EF: 63.8 %
Weight: 3894.21 oz

## 2019-12-06 LAB — LIPID PANEL
Cholesterol: 172 mg/dL (ref 0–200)
HDL: 28 mg/dL — ABNORMAL LOW (ref >40)
LDL Cholesterol: UNDETERMINED mg/dL (ref 0–99)
Total CHOL/HDL Ratio: 6.1 RATIO
Triglycerides: 948 mg/dL — ABNORMAL HIGH (ref <150)
VLDL: UNDETERMINED mg/dL (ref 0–40)

## 2019-12-06 LAB — GLUCOSE, CAPILLARY
Glucose-Capillary: 139 mg/dL — ABNORMAL HIGH (ref 70–99)
Glucose-Capillary: 139 mg/dL — ABNORMAL HIGH (ref 70–99)
Glucose-Capillary: 145 mg/dL — ABNORMAL HIGH (ref 70–99)
Glucose-Capillary: 145 mg/dL — ABNORMAL HIGH (ref 70–99)
Glucose-Capillary: 153 mg/dL — ABNORMAL HIGH (ref 70–99)
Glucose-Capillary: 153 mg/dL — ABNORMAL HIGH (ref 70–99)
Glucose-Capillary: 154 mg/dL — ABNORMAL HIGH (ref 70–99)
Glucose-Capillary: 160 mg/dL — ABNORMAL HIGH (ref 70–99)
Glucose-Capillary: 210 mg/dL — ABNORMAL HIGH (ref 70–99)

## 2019-12-06 LAB — MRSA PCR SCREENING: MRSA by PCR: NEGATIVE

## 2019-12-06 LAB — LDL CHOLESTEROL, DIRECT: Direct LDL: 75.5 mg/dL (ref 0–99)

## 2019-12-06 IMAGING — CT CT ANGIO NECK
2 of 7 series · 8 of 33 positions shown · IV contrast (OMNI 350)
Comparison: None.

CLINICAL DATA: Neuro deficit, acute, stroke suspected.

EXAM:
CT ANGIOGRAPHY HEAD AND NECK
TECHNIQUE: Multidetector CT imaging of the head and neck was performed using
the standard protocol during bolus administration of intravenous
contrast. Multiplanar CT image reconstructions and MIPs were
obtained to evaluate the vascular anatomy. Carotid stenosis
measurements (when applicable) are obtained utilizing NASCET
criteria, using the distal internal carotid diameter as the
denominator.
CONTRAST:  100mL OMNIPAQUE IOHEXOL 350 MG/ML SOLN

[Series 5: cta neck · axial · 0.44mm/px · z∈[+1288,+1408]mm · 2 of 181 slices shown]
[im 61/181  soft-tissue]
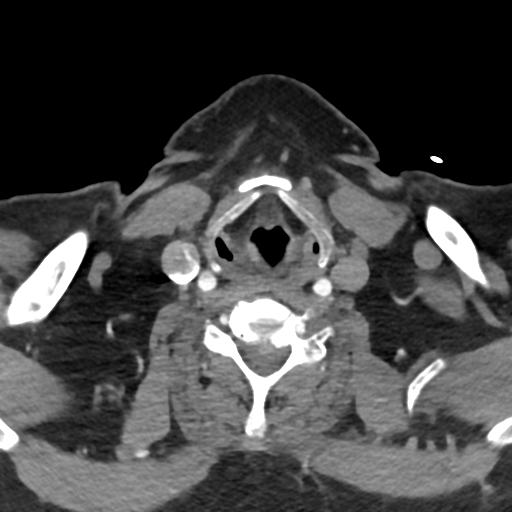
[im 121/181  soft-tissue]
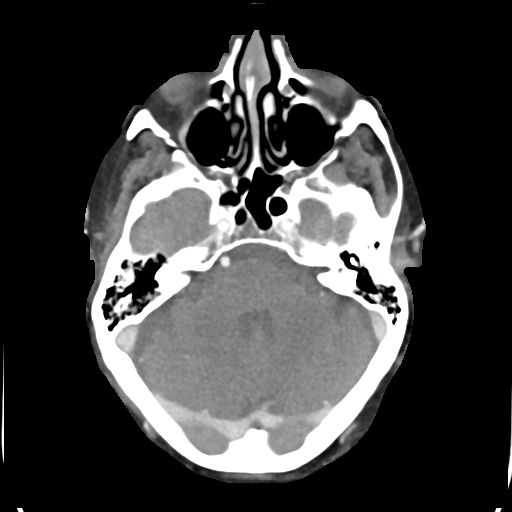

[Series 7: cta neck axial · axial · 0.39mm/px · z∈[+1220,+1477]mm · 6 of 361 slices shown]
[im 52/361  soft-tissue]
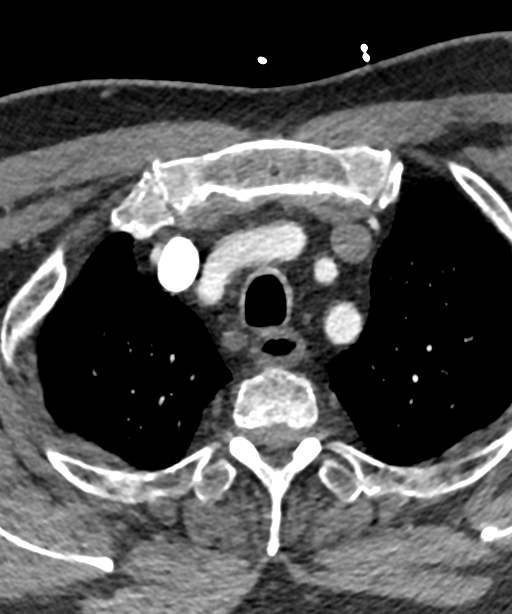
[im 103/361  bone]
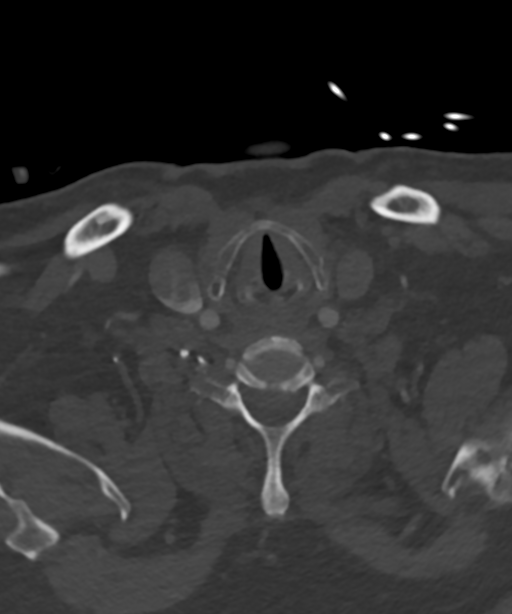
[im 155/361  soft-tissue]
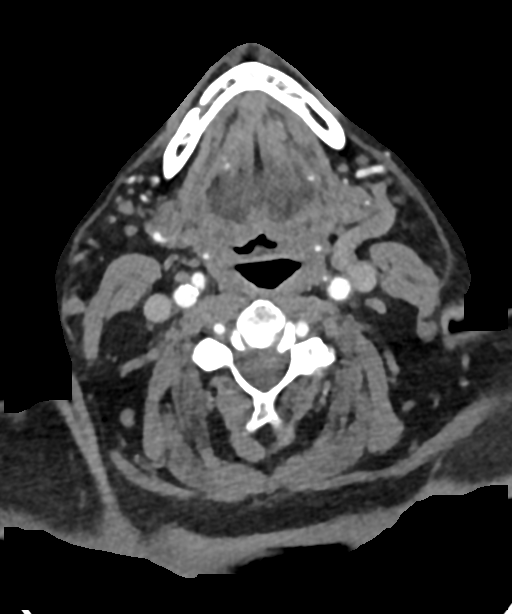
[im 206/361  bone]
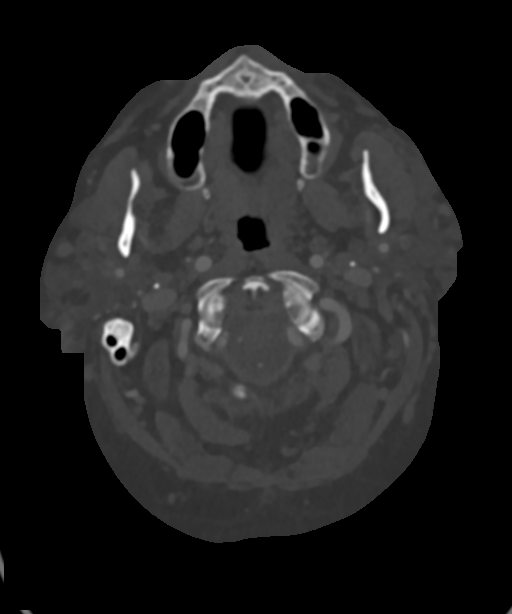
[im 258/361  soft-tissue]
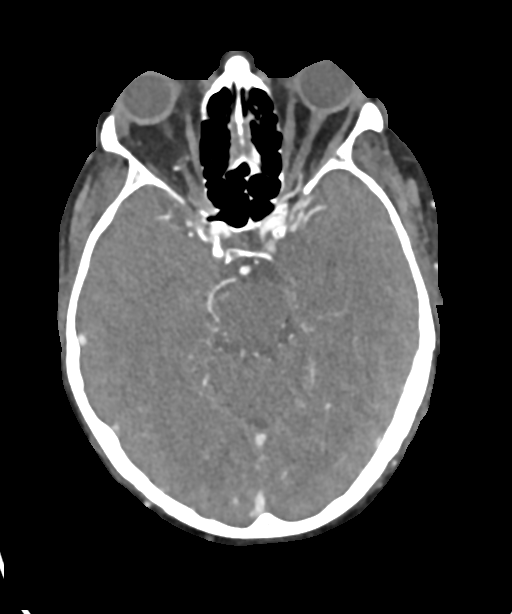
[im 309/361  bone]
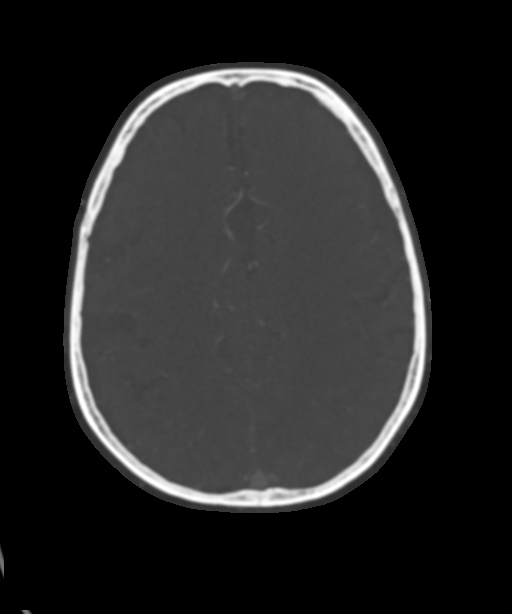

[8 of 33 positions shown; findings below may reference images not displayed]

FINDINGS: CTA NECK FINDINGS

Aortic arch: 3 vessel arch configuration is present. Atherosclerotic
changes are present. No significant aneurysm or stenosis is present.

Right carotid system: The right common carotid artery is within
normal limits. Atherosclerotic changes are present at the
bifurcation without significant stenosis. Mild tortuosity is present
in distal cervical right ICA without significant stenosis.

Left carotid system: Focal tortuosity is present in the cervical
left ICA with less than 50% stenosis. Atherosclerotic calcifications
are present at the bifurcation and proximal left ICA without
significant stenosis. Cervical ICA is otherwise normal.

Vertebral arteries: Left vertebral artery is the dominant vessel.
Both vertebral arteries originate at the subclavian without
significant stenosis.

Skeleton: Focal degenerative endplate changes present at C5-6. Facet
degenerative changes are worse on the left at C3-4 and C4-5.
Vertebral body heights are normal. Alignment is anatomic. No focal
lytic or blastic lesions are present. The patient is edentulous.

Other neck: Soft tissues the neck are otherwise unremarkable.

Upper chest: Centrilobular emphysematous changes are present at the
lung apices. No focal nodule mass, or airspace disease is present.
Thoracic inlet is within normal limits.

Review of the MIP images confirms the above findings

CTA HEAD FINDINGS

Anterior circulation: Atherosclerotic calcifications are present
within the cavernous internal carotid arteries bilaterally. Mild
narrowing of less than 50% is present in the left supraclinoid ICA.
ICA termini are normal bilaterally. The A1 and M1 segments are
normal. The left A1 segment is dominant. The anterior communicating
artery is patent. ACA and MCA branch vessels are within normal
limits.

Posterior circulation: Left vertebral artery is dominant. PICA
origins are visualized and normal. The vertebrobasilar junction is
normal. The basilar artery is normal. Both posterior cerebral
arteries originate from basilar tip. The PCA branch vessels are
within normal limits. Focal lesion is associated with the
hemorrhage.

Venous sinuses: The dural sinuses are patent. Straight sinus and
deep cerebral veins are within normal limits. Cortical veins are
unremarkable. No vascular malformation is evident.

Anatomic variants: None

Review of the MIP images confirms the above findings
IMPRESSION: 1. No vascular malformation associated with the hemorrhage.
2. Atherosclerotic changes at the carotid bifurcations bilaterally
and cavernous internal carotid arteries bilaterally without
significant stenosis.
3. Mild narrowing of less than 50% in the left supraclinoid ICA.
4. No other significant proximal stenosis, aneurysm, or branch
vessel occlusion within the Circle of Willis.
5. Multilevel spondylosis of the cervical spine.
6. Aortic Atherosclerosis ([U2]-[U2]) and Emphysema ([U2]-[U2]).

## 2019-12-06 IMAGING — CT CT HEAD W/O CM
4 series · 17 of 47 positions shown, 19 images · non-contrast
Comparison: None.

CLINICAL DATA: Stroke follow-up

EXAM:
CT HEAD WITHOUT CONTRAST
TECHNIQUE: Contiguous axial images were obtained from the base of the skull
through the vertex without intravenous contrast.

[Series 3: head wo · axial · 0.45mm/px · z∈[-138,+2]mm · 7 of 38 slices shown, 9 images]
[im 5/38  brain]
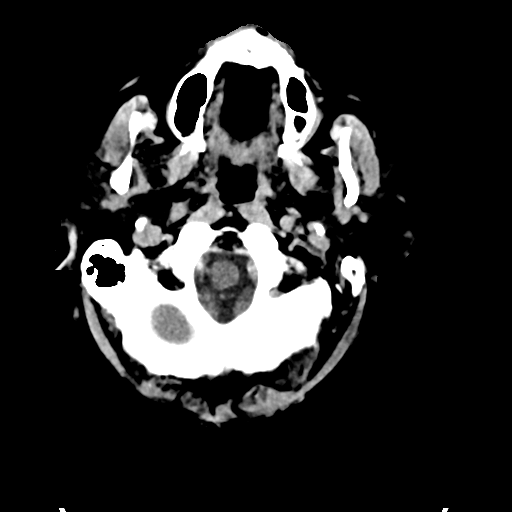
[im 5/38  bone]
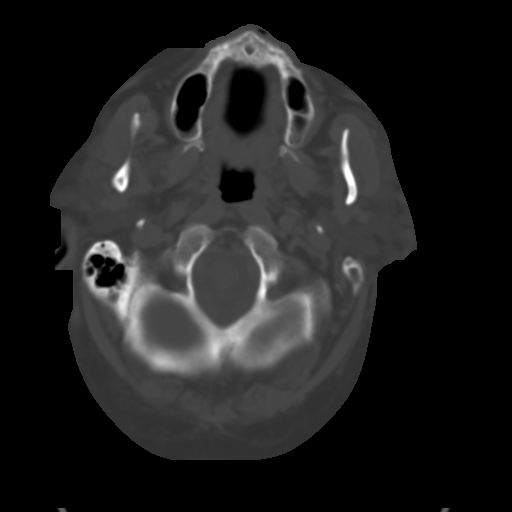
[im 10/38  brain]
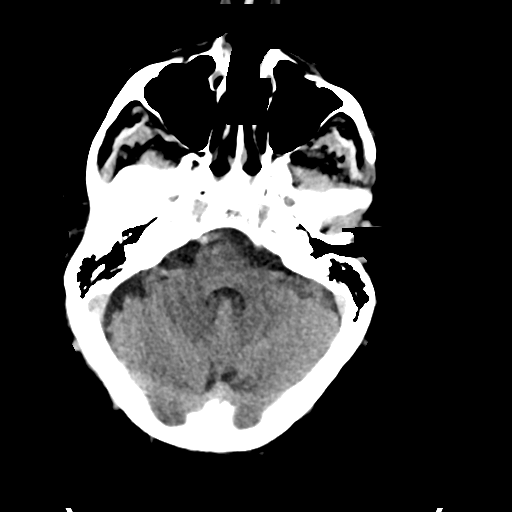
[im 14/38  brain]
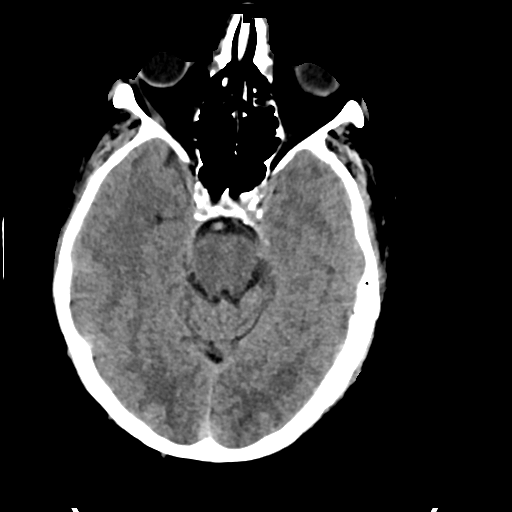
[im 19/38  brain]
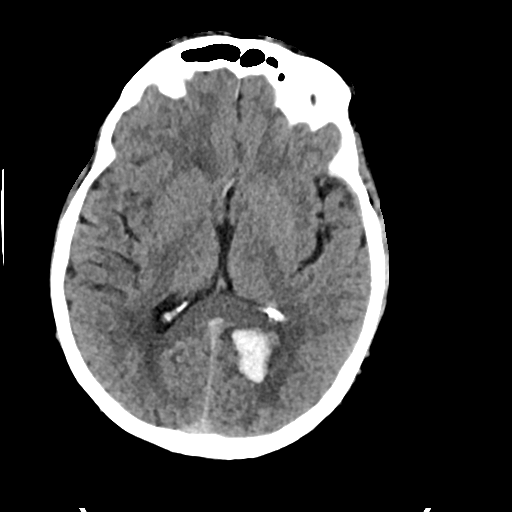
[im 24/38  brain]
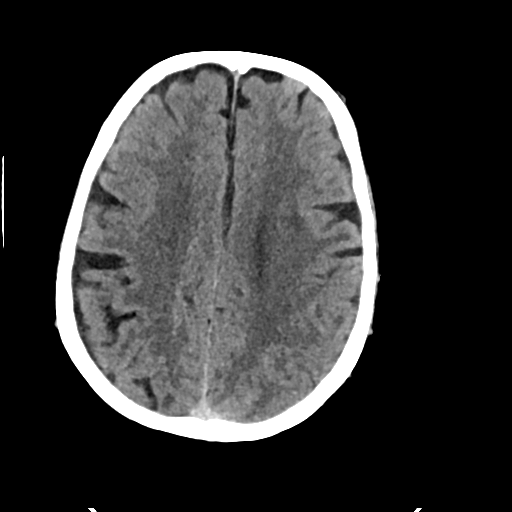
[im 24/38  bone]
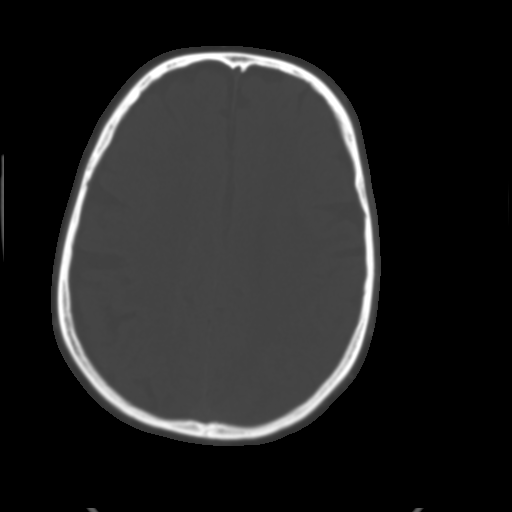
[im 28/38  brain]
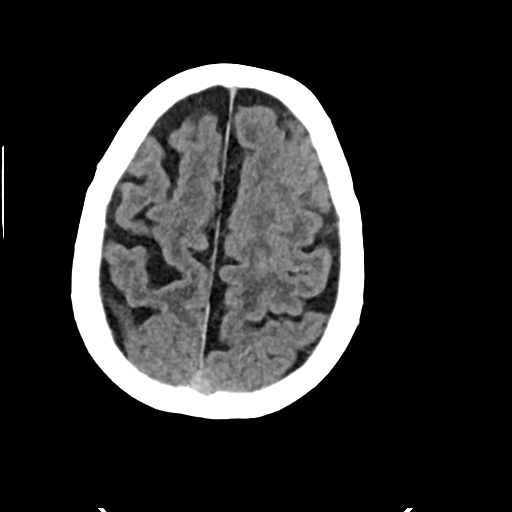
[im 33/38  brain]
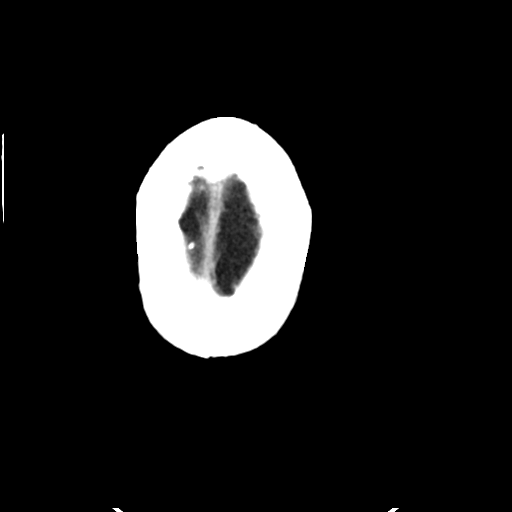

[Series 4: head bone · axial · 0.45mm/px · z∈[-140,-76]mm · 4 of 94 slices shown]
[im 10/94  bone]
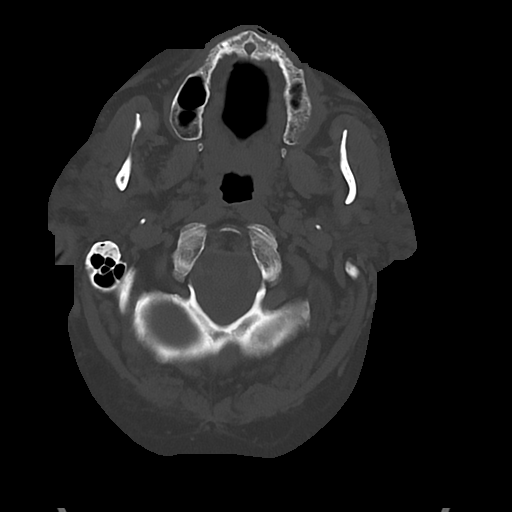
[im 19/94  bone]
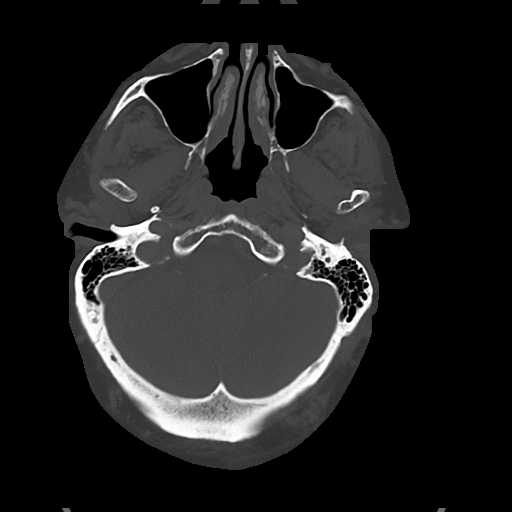
[im 28/94  bone]
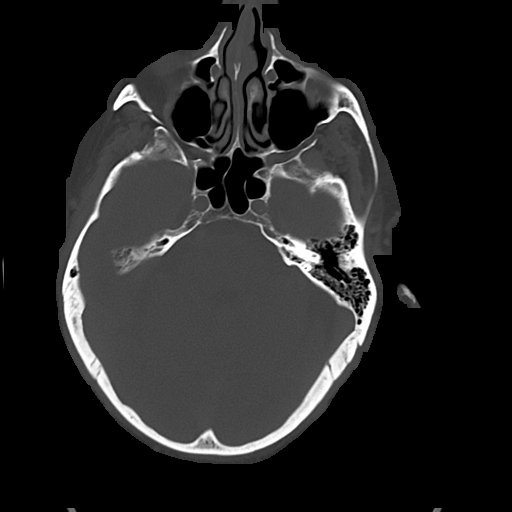
[im 42/94  bone]
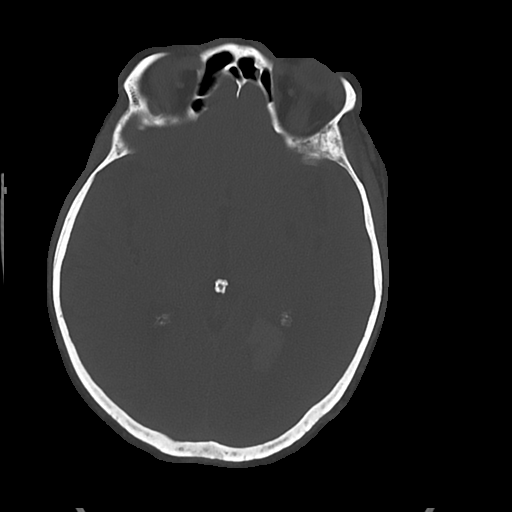

[Series 5: cor soft · coronal · 0.37mm/px · 3 of 82 slices shown]
[im 28/82  brain]
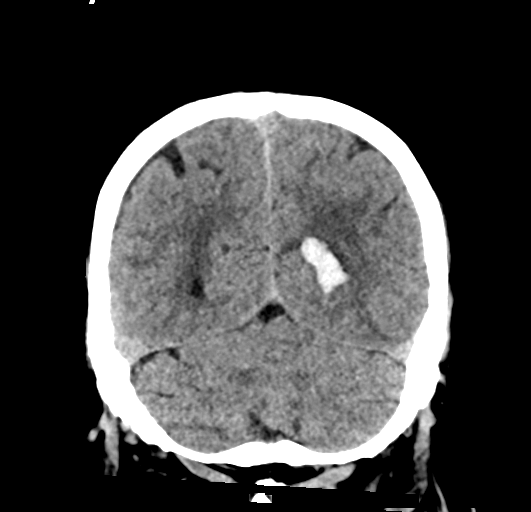
[im 37/82  brain]
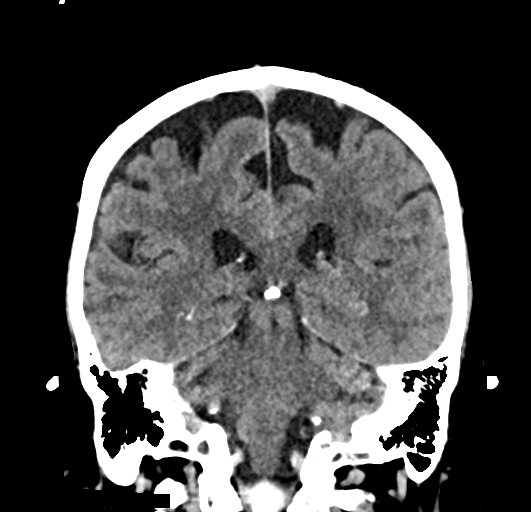
[im 46/82  brain]
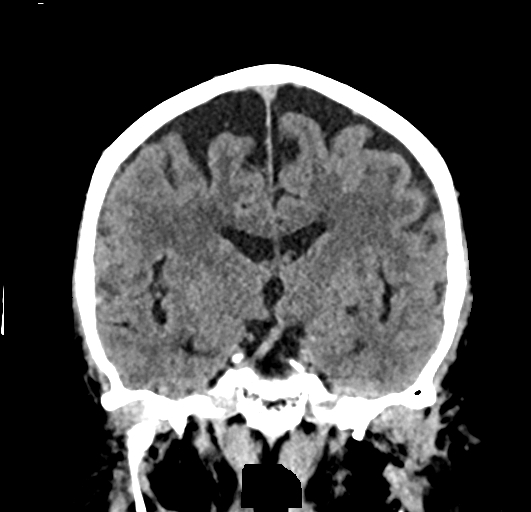

[Series 6: sag soft · sagittal · 0.36mm/px · 3 of 66 slices shown]
[im 22/66  brain]
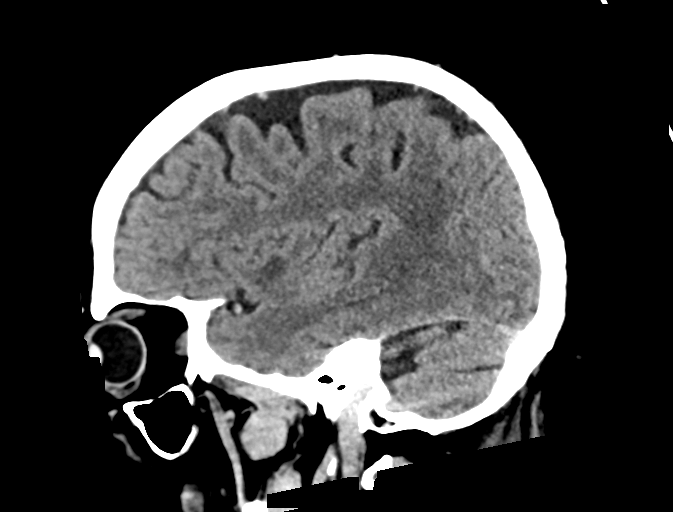
[im 33/66  brain]
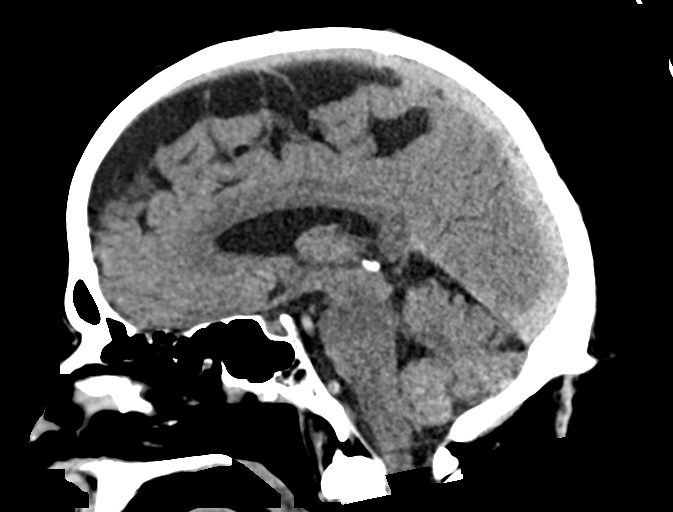
[im 44/66  brain]
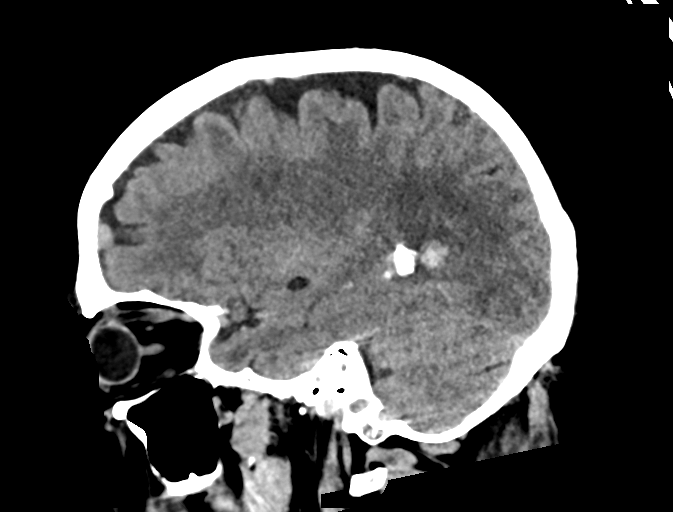

[17 of 47 positions shown; findings below may reference images not displayed]

FINDINGS: Brain: Unchanged size of intraparenchymal hematoma centered in the
left parietal lobe. Unchanged mild surrounding edema. There is
periventricular hypoattenuation compatible with chronic
microvascular disease.

Vascular: No hyperdense vessel or unexpected calcification.

Skull: Normal. Negative for fracture or focal lesion.

Sinuses/Orbits: No acute finding.

Other: None.
IMPRESSION: Unchanged size of intraparenchymal hematoma centered in the left
parietal lobe.

## 2019-12-06 MED ORDER — GLIPIZIDE ER 5 MG PO TB24
5.0000 mg | ORAL_TABLET | Freq: Every day | ORAL | Status: DC
  Administered 2019-12-06 – 2019-12-07 (×2): 5 mg via ORAL
  Filled 2019-12-06 (×2): qty 1

## 2019-12-06 MED ORDER — PANTOPRAZOLE SODIUM 40 MG PO TBEC
40.0000 mg | DELAYED_RELEASE_TABLET | Freq: Every day | ORAL | Status: DC
  Administered 2019-12-06: 40 mg via ORAL
  Filled 2019-12-06: qty 1

## 2019-12-06 MED ORDER — PERFLUTREN LIPID MICROSPHERE
1.0000 mL | INTRAVENOUS | Status: AC | PRN
  Administered 2019-12-06: 3 mL via INTRAVENOUS
  Filled 2019-12-06: qty 10

## 2019-12-06 MED ORDER — IOHEXOL 350 MG/ML SOLN
100.0000 mL | Freq: Once | INTRAVENOUS | Status: AC | PRN
  Administered 2019-12-06: 100 mL via INTRAVENOUS

## 2019-12-06 MED ORDER — HYDRALAZINE HCL 20 MG/ML IJ SOLN
10.0000 mg | INTRAMUSCULAR | Status: DC | PRN
  Administered 2019-12-07: 10 mg via INTRAVENOUS
  Filled 2019-12-06: qty 1

## 2019-12-06 MED ORDER — ATORVASTATIN CALCIUM 40 MG PO TABS: 40.0000 mg | ORAL_TABLET | Freq: Every day | ORAL | Status: DC

## 2019-12-06 MED ORDER — AMLODIPINE BESYLATE 5 MG PO TABS
5.0000 mg | ORAL_TABLET | Freq: Every day | ORAL | Status: DC
  Administered 2019-12-06 – 2019-12-07 (×2): 5 mg via ORAL
  Filled 2019-12-06 (×2): qty 1

## 2019-12-06 MED ORDER — LISINOPRIL 20 MG PO TABS
40.0000 mg | ORAL_TABLET | Freq: Every day | ORAL | Status: DC
  Administered 2019-12-06 – 2019-12-07 (×2): 40 mg via ORAL
  Filled 2019-12-06 (×2): qty 2

## 2019-12-06 MED ORDER — LABETALOL HCL 5 MG/ML IV SOLN
10.0000 mg | INTRAVENOUS | Status: DC | PRN
  Administered 2019-12-06: 20 mg via INTRAVENOUS
  Administered 2019-12-06: 10 mg via INTRAVENOUS
  Administered 2019-12-07: 20 mg via INTRAVENOUS
  Filled 2019-12-06: qty 4
  Filled 2019-12-06: qty 8

## 2019-12-06 MED ORDER — DIPHENHYDRAMINE HCL 50 MG/ML IJ SOLN
25.0000 mg | Freq: Once | INTRAMUSCULAR | Status: AC
  Administered 2019-12-06: 25 mg via INTRAVENOUS
  Filled 2019-12-06: qty 1

## 2019-12-06 MED ORDER — ORAL CARE MOUTH RINSE
15.0000 mL | Freq: Two times a day (BID) | OROMUCOSAL | Status: DC
  Administered 2019-12-06 (×3): 15 mL via OROMUCOSAL

## 2019-12-06 MED ORDER — NICOTINE 14 MG/24HR TD PT24
14.0000 mg | MEDICATED_PATCH | Freq: Every day | TRANSDERMAL | Status: DC
  Administered 2019-12-06: 14 mg via TRANSDERMAL
  Filled 2019-12-06 (×2): qty 1

## 2019-12-06 MED ORDER — INSULIN ASPART 100 UNIT/ML ~~LOC~~ SOLN
0.0000 [IU] | Freq: Three times a day (TID) | SUBCUTANEOUS | Status: DC
  Administered 2019-12-06: 3 [IU] via SUBCUTANEOUS
  Administered 2019-12-07: 8 [IU] via SUBCUTANEOUS
  Administered 2019-12-07: 2 [IU] via SUBCUTANEOUS

## 2019-12-06 NOTE — Plan of Care (Signed)
  Problem: Education: Goal: Knowledge of disease or condition will improve Outcome: Progressing Goal: Knowledge of secondary prevention will improve Outcome: Progressing   Problem: Self-Care: Goal: Ability to participate in self-care as condition permits will improve Outcome: Progressing Goal: Verbalization of feelings and concerns over difficulty with self-care will improve Outcome: Progressing Goal: Ability to communicate needs accurately will improve Outcome: Progressing   Problem: Nutrition: Goal: Risk of aspiration will decrease Outcome: Progressing Goal: Dietary intake will improve Outcome: Progressing

## 2019-12-06 NOTE — Progress Notes (Signed)
STROKE TEAM PROGRESS NOTE   INTERVAL HISTORY His daughter and granddaughter arrived following rounds and Dr. Leonie Man went back and spoke with them. They question if he was compliant with his meds.  I personally reviewed history of presenting illness, electronic medical records and imaging films in PACS. He presented with altered mental status due to left parietal parenchymal hemorrhage likely of hypertensive etiology.  He has remained neurologically stable overnight and blood pressure tightly controlled.  Follow-up CT scan of the head this morning shows stable appearance of the left parietal parenchymal hemorrhage without any progressive cytotoxic edema midline shift.  Urine drug screen is negative.  Hemoglobin A1c 7.3.  CT angiograms, lipid profile and echocardiogram are pending Vitals:   12/06/19 0745 12/06/19 0800 12/06/19 0815 12/06/19 0830  BP: (!) 132/47 (!) 136/107 (!) 137/58 (!) 133/54  Pulse: 84 100 100 91  Resp:      Temp:  98.4 F (36.9 C)    TempSrc:  Oral    SpO2: 96% 97% 95% 96%  Weight:      Height:       CBC:  Recent Labs  Lab 12/05/19 1528 12/05/19 1601  WBC 11.9*  --   NEUTROABS 7.7  --   HGB 19.0* 19.0*  HCT 55.7* 56.0*  MCV 88.8  --   PLT 363  --    Basic Metabolic Panel:  Recent Labs  Lab 12/05/19 1528 12/05/19 1601  NA 136 139  K 4.1 4.0  CL 100 101  CO2 24  --   GLUCOSE 141* 142*  BUN 13 20  CREATININE 0.93 0.80  CALCIUM 9.6  --    Lipid Panel:  Recent Labs  Lab 12/06/19 1005  CHOL 172  TRIG 948*  HDL 28*  CHOLHDL 6.1  VLDL UNABLE TO CALCULATE IF TRIGLYCERIDE OVER 400 mg/dL  LDLCALC UNABLE TO CALCULATE IF TRIGLYCERIDE OVER 400 mg/dL   HgbA1c:  Recent Labs  Lab 12/05/19 1923  HGBA1C 7.3*   Urine Drug Screen:  Recent Labs  Lab 12/05/19 1528  LABOPIA NONE DETECTED  COCAINSCRNUR NONE DETECTED  LABBENZ NONE DETECTED  AMPHETMU NONE DETECTED  THCU NONE DETECTED  LABBARB NONE DETECTED    Alcohol Level  Recent Labs  Lab  12/05/19 1528  ETH <10    IMAGING past 24 hours DG Chest 2 View  Result Date: 12/05/2019 CLINICAL DATA:  Altered mental status. EXAM: CHEST - 2 VIEW COMPARISON:  Sep 26, 2019 FINDINGS: Mild, diffuse chronic appearing increased lung markings are seen. Very mild linear scarring and/or atelectasis is seen along the periphery of the left lung base. This is present on the prior study. There is no evidence of acute infiltrate, pleural effusion or pneumothorax. The cardiac silhouette is mildly enlarged. The visualized skeletal structures are unremarkable. IMPRESSION: Chronic appearing increased lung markings without acute or active cardiopulmonary disease. Electronically Signed   By: Virgina Norfolk M.D.   On: 12/05/2019 16:12   CT HEAD WO CONTRAST  Result Date: 12/06/2019 CLINICAL DATA:  Stroke follow-up EXAM: CT HEAD WITHOUT CONTRAST TECHNIQUE: Contiguous axial images were obtained from the base of the skull through the vertex without intravenous contrast. COMPARISON:  None. FINDINGS: Brain: Unchanged size of intraparenchymal hematoma centered in the left parietal lobe. Unchanged mild surrounding edema. There is periventricular hypoattenuation compatible with chronic microvascular disease. Vascular: No hyperdense vessel or unexpected calcification. Skull: Normal. Negative for fracture or focal lesion. Sinuses/Orbits: No acute finding. Other: None. IMPRESSION: Unchanged size of intraparenchymal hematoma centered in the left  parietal lobe. Electronically Signed   By: Ulyses Jarred M.D.   On: 12/06/2019 03:21   CT HEAD WO CONTRAST  Result Date: 12/05/2019 CLINICAL DATA:  Altered mental status, headache and dizziness EXAM: CT HEAD WITHOUT CONTRAST TECHNIQUE: Contiguous axial images were obtained from the base of the skull through the vertex without intravenous contrast. COMPARISON:  CT and MRI from 2020 FINDINGS: Brain: In the left periatrial white matter, there is parenchymal hemorrhage measuring  approximately 3.1 x 2.1 x 1.5 cm with mild surrounding edema. There is mild mass effect including effacement of the adjacent posterior left lateral ventricle. No apparent intraventricular extension at this time. Gray-white differentiation is preserved. Patchy and confluent areas of hypoattenuation in the supratentorial white matter are nonspecific but probably reflect moderate chronic microvascular ischemic changes. Vascular: There is atherosclerotic calcification at the skull base. Skull: Calvarium is unremarkable. Sinuses/Orbits: No significant finding. Other: None. IMPRESSION: Parenchymal hemorrhage in the left periatrial white matter with mild edema and mass effect. No apparent intraventricular extension at this time. Of note, there were a few chronic microhemorrhages in this region on the prior MRI. These results were called by telephone at the time of interpretation on 12/05/2019 at 4:40 pm to provider [facility] , who verbally acknowledged these results. Electronically Signed   By: Macy Mis M.D.   On: 12/05/2019 17:06   MR BRAIN WO CONTRAST  Result Date: 12/05/2019 CLINICAL DATA:  Stroke follow-up EXAM: MRI HEAD WITHOUT CONTRAST TECHNIQUE: Multiplanar, multiecho pulse sequences of the brain and surrounding structures were obtained without intravenous contrast. COMPARISON:  Brain MRI 10/20/2018 Head CT 12/05/2019 FINDINGS: Brain: Unchanged appearance of intraparenchymal hematoma in the posterior left parietal lobe. No acute ischemia. Early confluent hyperintense T2-weighted signal of the periventricular and deep white matter, most commonly due to chronic ischemic microangiopathy. Normal volume of CSF spaces. No chronic microhemorrhage. Normal midline structures. Vascular: Normal flow voids. Skull and upper cervical spine: Normal marrow signal. Sinuses/Orbits: Negative.  Left ocular lens replacement. Other: None. IMPRESSION: 1. Unchanged appearance of intraparenchymal hematoma in the posterior left  parietal lobe. No new mass effect or hydrocephalus. 2. Findings of chronic ischemic microangiopathy. Electronically Signed   By: Ulyses Jarred M.D.   On: 12/05/2019 22:04    PHYSICAL EXAM Pleasant elderly Caucasian male not in distress. . Afebrile. Head is nontraumatic. Neck is supple without bruit.    Cardiac exam no murmur or gallop. Lungs are clear to auscultation. Distal pulses are well felt. Neurological Exam ;  Awake  Alert oriented x 3. Normal speech and language mildly diminished attention, registration and recall..eye movements full without nystagmus.fundi were not visualized. Vision acuity and fields appear normal. Hearing is normal. Palatal movements are normal. Face symmetric. Tongue midline. Normal strength, tone, reflexes and coordination. Normal sensation. Gait deferred.  ASSESSMENT/PLAN [name] is a 74 y.o. male with history of diabetes, CKD  presenting with severe HA and confusion. SBP 200 on arrival.   Stroke:   L parietal ICH, hypertensive  CT head L periatrial white matter IPH w/ mild edema. Chronic microhemorrhages.   MRI  Unchanged L parietal IPH  CT head stable hemorrhage    CTA head & neck B ICA atherosclerosis. L ICA < 50% narrowing. Cervical spondylosis. Aortic atherosclerosis. Emphysema.   2D Echo EF 60-65%. No source of embolus. LA mildly dilated  LDL UTC d/t TG 948, direct LDL 75.5  HgbA1c 7.3  VTE prophylaxis - SCDs   aspirin 81 mg daily prior to admission, now  on No antithrombotic given hemorrhage   Therapy recommendations:  pending   Disposition:  pending   Keep in ICU x 24h. Transfer to floor once stable and off drip.   Hypertensive Emergency  BP as high as 200/105  Treated w/ cleviprex  Home meds:  Lisinopril 40  Stable . SBP goal < 140 x 24h then < 160 . Resume lisinopril, add amlodipine 5   . Wean cleviprex as able . Long-term BP goal normotensive  Hyperlipidemia  LDL  UTC d/t TG 948, direct LDL 75.5, goal LDL < 70    on no statin PTA  Hold statin given hemorrhage   Consider lipitor 40 at time of d/c  Diabetes type II Uncontrolled  Home meds:  glipizie 5, metformin 1000 bid, jardiance 10  HgbA1c 7.3, goal < 7.0  CBGs Recent Labs    12/06/19 0002 12/06/19 0351 12/06/19 0804  GLUCAP 153* 145* 139*      SSI  Resumed glipizide.   Other Stroke Risk Factors  Advanced age  Cigarette smoker, advised to stop smoking. Added nicotine patch 14mg  daily  Hx ETOH use, alcohol level <10, advised to drink no more than 2 drink(s) a day  Obesity, Body mass index is 33.96 kg/m., recommend weight loss, diet and exercise as appropriate   Migraines on prn depakote  Other Active Problems  Cognitive decline w/ poor recall - ? 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  Recommend continue close neurological monitoring and strict blood pressure control with systolic less than 263 for the first 24 hours and then below 160.  Mobilize out of bed.  Therapy consults.  Check CT angiograms, echocardiogram and lipid profile.  Long discussion with the patient and subsequently with his daughter and granddaughter at the bedside and answered questions. This patient is critically ill and at significant risk of neurological worsening, death and care requires constant monitoring of vital signs, hemodynamics,respiratory and cardiac monitoring, extensive review of multiple databases, frequent neurological assessment, discussion with family, other specialists and medical decision making of high complexity.I have made any additions or clarifications directly to the above note.This critical care time does not reflect  procedure time, or teaching time or supervisory time of PA/NP/Med Resident etc but could involve care discussion time.  I spent 30 minutes of neurocritical care time  in the care of  this patient.    Antony Contras, MD  To contact Stroke Continuity provider, please refer to [email or web address] After hours, contact General Neurology

## 2019-12-06 NOTE — Progress Notes (Signed)
Occupational Therapy Evaluation Patient Details Name: Matthew [name] MRN: [id number] DOB: 1946-02-11 Today's Date: 12/06/2019    History of Present Illness 74 y.o. male with a history of diabetes, CKD, thyroid CA, COPD who presents with headache. he sought care in the emergency department where family also noted that he had seemed slightly confused. CT performed which demonstrates a parenchymal hemorrhage on the left. He was severely hypertensive in the 200s.   Clinical Impression   PTA, pt states he was independent with ADL, IADL and mobility, including driving. Pt is a retired from Art therapist work. Pt able to ambulate to bathroom and complete ADL @ sink level with +2 min A for safety. Overall set up/supervision for UB ADL and min A for LB ADL due to deficits listed below. Pt with significant cognitive impairment, impacting ability to stay home alone. VSS during session on RA. Pt will most likely be able to progress to DC home with 24/7 S and [place]. Will follow acutely.     Follow Up Recommendations  Home health OT;Supervision/Assistance - 24 hour; Refrain from driving   Equipment Recommendations  3 in 1 bedside commode    Recommendations for Other Services       Precautions / Restrictions Precautions Precautions: Fall      Mobility Bed Mobility Overal bed mobility: Needs Assistance Bed Mobility: Supine to Sit     Supine to sit: Min assist        Transfers Overall transfer level: Needs assistance Equipment used: 2 person hand held assist Transfers: Sit to/from Stand Sit to Stand: Min assist;+2 safety/equipment              Balance Overall balance assessment: Needs assistance   Sitting balance-Leahy Scale: Good       Standing balance-Leahy Scale: Poor                             ADL either performed or assessed with clinical judgement   ADL Overall ADL's : Needs assistance/impaired Eating/Feeding: Modified independent   Grooming:  Supervision/safety;Set up;Cueing for safety (cuing to locate soap on R)   Upper Body Bathing: Supervision/ safety;Set up;Sitting   Lower Body Bathing: Minimal assistance;Sit to/from stand   Upper Body Dressing : Sitting;Set up;Supervision/safety   Lower Body Dressing: Minimal assistance;Sit to/from stand   Toilet Transfer: Minimal assistance;+2 for safety/equipment   Toileting- Clothing Manipulation and Hygiene: Minimal assistance;Sit to/from stand       Functional mobility during ADLs: Minimal assistance;+2 for safety/equipment;Cueing for safety       Vision Baseline Vision/History: Wears glasses Wears Glasses: Reading only Patient Visual Report: No change from baseline Additional Comments: Will further assess; unable to read nametag/read inaccurately; not seeing soap/toilet papaer on R side     Perception Perception Comments: appears intact   Praxis Praxis Praxis-Other Comments: will further assess; appropirate motor planning during functional tasks    Pertinent Vitals/Pain Pain Assessment: No/denies pain     Hand Dominance Right   Extremity/Trunk Assessment Upper Extremity Assessment Upper Extremity Assessment: Generalized weakness (will further assess; using functionally)   Lower Extremity Assessment Lower Extremity Assessment: Defer to PT evaluation   Cervical / Trunk Assessment Cervical / Trunk Assessment: Other exceptions (increased body habitus)   Communication Communication Communication: No difficulties   Cognition Arousal/Alertness: Awake/alert Behavior During Therapy: WFL for tasks assessed/performed Overall Cognitive Status: Impaired/Different from baseline Area of Impairment: Orientation;Attention;Memory;Following commands;Safety/judgement;Awareness;Problem solving  Orientation Level: Disoriented to;Time;Situation Current Attention Level: Sustained Memory: Decreased short-term memory Following Commands: Follows one step  commands with increased time Safety/Judgement: Decreased awareness of safety;Decreased awareness of deficits Awareness: Emergent Problem Solving: Slow processing     General Comments       Exercises     Shoulder Instructions      Home Living Family/patient expects to be discharged to:: Private residence Living Arrangements: Spouse/significant other Available Help at Discharge: Family;Available 24 hours/day Type of Home: House Home Access: Stairs to enter [employer] of Steps: 4   Home Layout: One level     Bathroom Shower/Tub: Tub/shower unit;Walk-in shower   Bathroom Toilet: Standard Bathroom Accessibility: Yes How Accessible: Accessible via walker Home Equipment: None          Prior Functioning/Environment Level of Independence: Independent                 OT Problem List: Decreased strength;Decreased activity tolerance;Impaired balance (sitting and/or standing);Decreased cognition;Decreased safety awareness;Decreased knowledge of use of DME or AE;Cardiopulmonary status limiting activity;Obesity      OT Treatment/Interventions: Self-care/ADL training;Therapeutic exercise;Neuromuscular education;Energy conservation;DME and/or AE instruction;Therapeutic activities;Cognitive remediation/compensation;Balance training;Patient/family education;Visual/perceptual remediation/compensation    OT Goals(Current goals can be found in the care plan section) Acute Rehab OT Goals Patient Stated Goal: to go home OT Goal Formulation: With patient Time For Goal Achievement: 12/20/19 Potential to Achieve Goals: Good  OT Frequency: Min 2X/week   Barriers to D/C:            Co-evaluation PT/OT/SLP Co-Evaluation/Treatment: Yes Reason for Co-Treatment: Necessary to address cognition/behavior during functional activity;For patient/therapist safety   OT goals addressed during session: ADL's and self-care      AM-PAC OT "6 Clicks" Daily Activity     Outcome  Measure Help from another person eating meals?: None Help from another person taking care of personal grooming?: A Little Help from another person toileting, which includes using toliet, bedpan, or urinal?: A Little Help from another person bathing (including washing, rinsing, drying)?: A Little Help from another person to put on and taking off regular upper body clothing?: A Little Help from another person to put on and taking off regular lower body clothing?: A Little 6 Click Score: 19   End of Session Nurse Communication: Mobility status  Activity Tolerance: Patient tolerated treatment well Patient left: in chair;with call bell/phone within reach;with chair alarm set  OT Visit Diagnosis: Unsteadiness on feet (R26.81);History of falling (Z91.81);Other symptoms and signs involving cognitive function                Time: 1500-1537 OT Time Calculation (min): 37 min Charges:  OT General Charges $OT Visit: 1 Visit OT Evaluation $OT Eval Moderate Complexity: Harris, OT/L   Acute OT Clinical Specialist Acute Rehabilitation Services Pager [phone] Office [phone]   [facility] 12/06/2019, 4:00 PM

## 2019-12-06 NOTE — Progress Notes (Signed)
Patient c/o sudden onset severe 8/10 headache and nausea. Not relieved by tylenol. NIH 1, no acute neuro changes. Dr. Lorraine Lax paged at this time. STAT head CT.

## 2019-12-06 NOTE — Progress Notes (Signed)
  Echocardiogram 2D Echocardiogram has been performed.  Matthew [name] 12/06/2019, 1:41 PM

## 2019-12-06 NOTE — Evaluation (Signed)
Physical Therapy Evaluation Patient Details Name: Matthew [name] MRN: [id number] DOB: Jan 30, 1946 Today's Date: 12/06/2019   History of Present Illness  74 y.o. male with a history of diabetes, CKD, thyroid CA, COPD who presents with headache. he sought care in the emergency department where family also noted that he had seemed slightly confused. CT performed which demonstrates a parenchymal hemorrhage on the left. He was severely hypertensive in the 200s.  Clinical Impression   Pt presents with generalized weakness, impaired cognition presenting as STM and safety awareness deficits, difficulty performing mobility tasks, and decreased activity tolerance. Pt to benefit from acute PT to address deficits. Pt ambulated room distance with min +2 for bilateral HHA and steadying, pt reports no need for AD at home prior to admission. PT recommending HHPT with 24/7 assist from girlfriend, if pt does not have someone living with him may need ST-SNF. PT to progress mobility as tolerated, and will continue to follow acutely.      Follow Up Recommendations Home health PT;Supervision/Assistance - 24 hour    Equipment Recommendations  Other (comment) (To assess next session)    Recommendations for Other Services       Precautions / Restrictions Precautions Precautions: Fall Restrictions Weight Bearing Restrictions: No      Mobility  Bed Mobility Overal bed mobility: Needs Assistance Bed Mobility: Supine to Sit     Supine to sit: Min assist     General bed mobility comments: min assist for completion of trunk rise with HHA.  Transfers Overall transfer level: Needs assistance Equipment used: 2 person hand held assist Transfers: Sit to/from Stand Sit to Stand: Min assist;+2 safety/equipment         General transfer comment: min +2 for power up and steadying, pt requiring bilateral HHA or reaches for environment.  Ambulation/Gait Ambulation/Gait assistance: Min assist;+2 physical  assistance;+2 safety/equipment Gait Distance (Feet): 25 Feet Assistive device: 2 person hand held assist Gait Pattern/deviations: Step-through pattern;Decreased stride length;Wide base of support Gait velocity: decr   General Gait Details: min assist to steady, +2 for lines/leads and HHA as needed. Verbal cuing for room navigation.  Stairs            Wheelchair Mobility    Modified Rankin (Stroke Patients Only) Modified Rankin (Stroke Patients Only) Pre-Morbid Rankin Score: No symptoms Modified Rankin: Moderately severe disability     Balance Overall balance assessment: Needs assistance   Sitting balance-Leahy Scale: Good       Standing balance-Leahy Scale: Poor Standing balance comment: reliant on external assist                             Pertinent Vitals/Pain Pain Assessment: No/denies pain    Home Living Family/patient expects to be discharged to:: Private residence Living Arrangements: Spouse/significant other Available Help at Discharge: Family;Available 24 hours/day Type of Home: House Home Access: Stairs to enter   [employer] of Steps: 4 Home Layout: One level Home Equipment: None      Prior Function Level of Independence: Independent         Comments: pt states he likes cooking pinto beans and potatoes, fixes his own [facility] unit at home. Pt is a retired Scientist, physiological.     Hand Dominance   Dominant Hand: Right    Extremity/Trunk Assessment   Upper Extremity Assessment Upper Extremity Assessment: Defer to OT evaluation    Lower Extremity Assessment Lower Extremity Assessment: Generalized weakness  Cervical / Trunk Assessment Cervical / Trunk Assessment: Other exceptions Cervical / Trunk Exceptions: abdominal obesity  Communication   Communication: No difficulties  Cognition Arousal/Alertness: Awake/alert Behavior During Therapy: WFL for tasks assessed/performed Overall Cognitive Status: Impaired/Different  from baseline Area of Impairment: Orientation;Attention;Memory;Following commands;Safety/judgement;Awareness;Problem solving                 Orientation Level: Disoriented to;Time;Situation Current Attention Level: Sustained Memory: Decreased short-term memory Following Commands: Follows one step commands with increased time Safety/Judgement: Decreased awareness of safety;Decreased awareness of deficits Awareness: Emergent Problem Solving: Slow processing;Requires verbal cues;Requires tactile cues General Comments: Pt asks PT and OT multiple times "where are you from?" with no recollection of asking before, does not recognize NT when he enters room even though he has been helping pt all day. Difficult to get pt to focus on task at hand, easily distractible.      General Comments General comments (skin integrity, edema, etc.): VSS, SpO2 90% and greater on RA    Exercises     Assessment/Plan    PT Assessment Patient needs continued PT services  PT Problem List Decreased strength;Decreased mobility;Decreased safety awareness;Decreased knowledge of use of DME;Decreased balance;Obesity;Decreased activity tolerance;Decreased cognition       PT Treatment Interventions DME instruction;Therapeutic activities;Gait training;Therapeutic exercise;Patient/family education;Balance training;Stair training;Functional mobility training;Neuromuscular re-education    PT Goals (Current goals can be found in the Care Plan section)  Acute Rehab PT Goals Patient Stated Goal: go home PT Goal Formulation: With patient Time For Goal Achievement: 12/20/19 Potential to Achieve Goals: Good    Frequency Min 4X/week   Barriers to discharge        Co-evaluation PT/OT/SLP Co-Evaluation/Treatment: Yes Reason for Co-Treatment: Necessary to address cognition/behavior during functional activity;For patient/therapist safety;To address functional/ADL transfers PT goals addressed during session:  Mobility/safety with mobility;Balance OT goals addressed during session: ADL's and self-care       AM-PAC PT "6 Clicks" Mobility  Outcome Measure Help needed turning from your back to your side while in a flat bed without using bedrails?: A Little Help needed moving from lying on your back to sitting on the side of a flat bed without using bedrails?: A Little Help needed moving to and from a bed to a chair (including a wheelchair)?: A Little Help needed standing up from a chair using your arms (e.g., wheelchair or bedside chair)?: A Little Help needed to walk in hospital room?: A Lot Help needed climbing 3-5 steps with a railing? : A Lot 6 Click Score: 16    End of Session   Activity Tolerance: Patient tolerated treatment well;Patient limited by fatigue Patient left: in chair;with chair alarm set;with call bell/phone within reach Nurse Communication: Mobility status PT Visit Diagnosis: Other abnormalities of gait and mobility (R26.89);Muscle weakness (generalized) (M62.81)    Time: 1500-1530 PT Time Calculation (min) (ACUTE ONLY): 30 min   Charges:   PT Evaluation $PT Eval Low Complexity: 1 Low        Avabella Wailes E, PT Acute Rehabilitation Services Pager [phone]  Office [phone]   Kyl Givler D Elijan Googe 12/06/2019, 4:20 PM

## 2019-12-06 NOTE — Progress Notes (Signed)
OT Cancellation Note  Patient Details Name: GRAY DOERING MRN: [id number] DOB: 1946/01/26   Cancelled Treatment:    Reason Eval/Treat Not Completed: Patient not medically ready;Active bedrest order (Stat CT ordered. ) OT to assess when medically appropriate. Thanks [place], OT/L   Acute OT Clinical Specialist Acute Rehabilitation Services Pager [phone] Office [phone]  12/06/2019, 7:53 AM

## 2019-12-07 DIAGNOSIS — D751 Secondary polycythemia: Secondary | ICD-10-CM | POA: Diagnosis present

## 2019-12-07 DIAGNOSIS — I161 Hypertensive emergency: Secondary | ICD-10-CM | POA: Diagnosis present

## 2019-12-07 DIAGNOSIS — R4189 Other symptoms and signs involving cognitive functions and awareness: Secondary | ICD-10-CM | POA: Diagnosis present

## 2019-12-07 DIAGNOSIS — IMO0002 Reserved for concepts with insufficient information to code with codable children: Secondary | ICD-10-CM | POA: Diagnosis present

## 2019-12-07 DIAGNOSIS — N182 Chronic kidney disease, stage 2 (mild): Secondary | ICD-10-CM | POA: Diagnosis present

## 2019-12-07 DIAGNOSIS — J449 Chronic obstructive pulmonary disease, unspecified: Secondary | ICD-10-CM | POA: Diagnosis present

## 2019-12-07 DIAGNOSIS — E785 Hyperlipidemia, unspecified: Secondary | ICD-10-CM | POA: Diagnosis present

## 2019-12-07 DIAGNOSIS — E039 Hypothyroidism, unspecified: Secondary | ICD-10-CM | POA: Diagnosis present

## 2019-12-07 DIAGNOSIS — E119 Type 2 diabetes mellitus without complications: Secondary | ICD-10-CM | POA: Diagnosis present

## 2019-12-07 LAB — GLUCOSE, CAPILLARY
Glucose-Capillary: 254 mg/dL — ABNORMAL HIGH (ref 70–99)
Glucose-Capillary: 130 mg/dL — ABNORMAL HIGH (ref 70–99)

## 2019-12-07 MED ORDER — AMLODIPINE BESYLATE 5 MG PO TABS: 5.0000 mg | ORAL_TABLET | Freq: Every day | ORAL | 2 refills | Status: DC

## 2019-12-07 MED ORDER — NICOTINE 14 MG/24HR TD PT24: 14.0000 mg | MEDICATED_PATCH | Freq: Every day | TRANSDERMAL | 0 refills | Status: DC

## 2019-12-07 MED ORDER — METFORMIN HCL 1000 MG PO TABS: 1000.0000 mg | ORAL_TABLET | Freq: Two times a day (BID) | ORAL | Status: DC

## 2019-12-07 NOTE — Progress Notes (Signed)
Pt's granddaughter still waiting for Amlodipine to be called in at pharmacy. NP Biby confirmed she DID call it in. Manesha at at [facility] on [address] confirms it was called in. Family notify.

## 2019-12-07 NOTE — Discharge Summary (Addendum)
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 Start taking on: December 08, 2019   blood glucose meter kit and supplies Dispense based on patient and insurance preference. Use up to four times daily as directed. (FOR ICD-10 E10.9, E11.9).   divalproex 125 MG DR tablet Commonly known as: DEPAKOTE Take 125-250 mg by mouth at bedtime as needed (SLEEP MOOD AND MIGRAINE PREVENTION).   glipiZIDE 5 MG 24 hr tablet Commonly known as: GLUCOTROL XL Take 5 mg by mouth daily.   Jardiance 10 MG Tabs tablet Generic drug: empagliflozin Take 10 mg by mouth daily.   levothyroxine 200 MCG tablet Commonly known as: SYNTHROID Take 300 mcg by mouth daily before breakfast. Take 1 tablet by mouth once daily then take 2 tablets on the same day each week for hypothyroid   lisinopril 40 MG tablet Commonly known as: ZESTRIL Take 1 tablet (40 mg total) by mouth daily.   metFORMIN 1000 MG tablet Commonly known as: GLUCOPHAGE Take 1 tablet (1,000 mg total) by mouth 2 (two) times daily with a meal. Start taking on: December 08, 2019 What changed: when  to take this   nicotine 14 mg/24hr patch Commonly known as: NICODERM CQ - dosed in mg/24 hours Place 1 patch (14 mg total) onto the skin daily.        LABORATORY STUDIES CBC    Component Value Date/Time   WBC 11.9 (H) 12/05/2019 1528   RBC 6.27 (H) 12/05/2019 1528   HGB 19.0 (H) 12/05/2019 1601   HCT 56.0 (H) 12/05/2019 1601   PLT 363 12/05/2019 1528   MCV 88.8 12/05/2019 1528   MCH 30.3 12/05/2019 1528   MCHC 34.1 12/05/2019 1528   RDW 14.6 12/05/2019 1528   LYMPHSABS 3.3 12/05/2019 1528   MONOABS 0.7 12/05/2019 1528   EOSABS 0.1 12/05/2019 1528   BASOSABS 0.1 12/05/2019 1528   CMP    Component Value Date/Time   NA 139 12/05/2019 1601   K 4.0 12/05/2019 1601   CL 101 12/05/2019 1601   CO2 24 12/05/2019 1528   GLUCOSE 142 (H) 12/05/2019 1601   BUN 20 12/05/2019 1601   CREATININE 0.80 12/05/2019 1601   CALCIUM 9.6 12/05/2019 1528   PROT 7.1 12/05/2019 1528   ALBUMIN 3.4 (L) 12/05/2019 1528   AST 18 12/05/2019 1528   ALT 16 12/05/2019 1528   ALKPHOS 82 12/05/2019 1528   BILITOT 0.5 12/05/2019 1528   GFRNONAA >60 12/05/2019 1528   GFRAA >60 12/05/2019 1528   COAGS  Lab Results  Component Value Date   INR 0.9 12/05/2019   Lipid Panel    Component Value Date/Time   CHOL 172 12/06/2019 1005   TRIG 948 (H) 12/06/2019 1005   HDL 28 (L) 12/06/2019 1005   CHOLHDL 6.1 12/06/2019 1005   VLDL UNABLE TO CALCULATE IF TRIGLYCERIDE OVER 400 mg/dL 12/06/2019 1005   LDLCALC UNABLE TO CALCULATE IF TRIGLYCERIDE OVER 400 mg/dL 12/06/2019 1005   HgbA1C  Lab Results  Component Value Date   HGBA1C 7.3 (H) 12/05/2019   Urinalysis    Component Value Date/Time   COLORURINE YELLOW 12/05/2019 1528   APPEARANCEUR CLEAR 12/05/2019 1528   LABSPEC 1.023 12/05/2019 1528   PHURINE 5.0 12/05/2019 1528   GLUCOSEU >=500 (A) 12/05/2019 1528   HGBUR SMALL (A) 12/05/2019 1528   BILIRUBINUR NEGATIVE 12/05/2019 1528   KETONESUR NEGATIVE 12/05/2019 1528   PROTEINUR >=300 (A)  12/05/2019 1528   NITRITE NEGATIVE 12/05/2019 1528   LEUKOCYTESUR NEGATIVE 12/05/2019 1528   Urine Drug Screen     Component Value Date/Time   LABOPIA NONE DETECTED 12/05/2019 1528   COCAINSCRNUR NONE DETECTED 12/05/2019 1528   LABBENZ NONE DETECTED 12/05/2019 1528   AMPHETMU NONE DETECTED 12/05/2019 1528   THCU NONE DETECTED 12/05/2019 1528   LABBARB NONE DETECTED 12/05/2019 1528    Alcohol Level    Component Value Date/Time   ETH <10 12/05/2019 1528    SIGNIFICANT DIAGNOSTIC STUDIES DG Chest 2 View  Result Date: 12/05/2019 CLINICAL DATA:  Altered mental status. EXAM: CHEST - 2 VIEW COMPARISON:  Sep 26, 2019 FINDINGS: Mild, diffuse chronic appearing increased lung markings are seen. Very mild linear scarring and/or atelectasis is seen along the periphery of the left lung base. This is present on the prior study. There is no evidence of acute infiltrate, pleural effusion or pneumothorax. The cardiac silhouette is mildly enlarged. The visualized skeletal structures are unremarkable. IMPRESSION: Chronic appearing increased lung markings without acute or active cardiopulmonary disease. Electronically Signed   By: Virgina Norfolk M.D.   On: 12/05/2019 16:12   CT HEAD WO CONTRAST  Result Date: 12/06/2019 CLINICAL DATA:  Stroke follow-up EXAM: CT HEAD WITHOUT CONTRAST TECHNIQUE: Contiguous axial images were obtained from the base of the skull through the vertex without intravenous contrast. COMPARISON:  None. FINDINGS: Brain: Unchanged size of intraparenchymal hematoma centered in the left parietal lobe. Unchanged mild surrounding edema. There is periventricular hypoattenuation compatible with chronic microvascular disease. Vascular: No hyperdense vessel or unexpected calcification. Skull: Normal. Negative for fracture or focal lesion. Sinuses/Orbits: No acute finding. Other: None. IMPRESSION: Unchanged size of intraparenchymal hematoma centered in the left parietal lobe. Electronically Signed    By: Ulyses Jarred M.D.   On: 12/06/2019 03:21   CT HEAD WO CONTRAST  Result Date: 12/05/2019 CLINICAL DATA:  Altered mental status, headache and dizziness EXAM: CT HEAD WITHOUT CONTRAST TECHNIQUE: Contiguous axial images were obtained from the base of the skull through the vertex without intravenous contrast. COMPARISON:  CT and MRI from 2020 FINDINGS: Brain: In the left periatrial white matter, there is parenchymal hemorrhage measuring approximately 3.1 x 2.1 x 1.5 cm with mild surrounding edema. There is mild mass effect including effacement of the adjacent posterior left lateral ventricle. No apparent intraventricular extension at this time. Gray-white differentiation is preserved. Patchy and confluent areas of hypoattenuation in the supratentorial white matter are nonspecific but probably reflect moderate chronic microvascular ischemic changes. Vascular: There is atherosclerotic calcification at the skull base. Skull: Calvarium is unremarkable. Sinuses/Orbits: No  significant finding. Other: None. IMPRESSION: Parenchymal hemorrhage in the left periatrial white matter with mild edema and mass effect. No apparent intraventricular extension at this time. Of note, there were a few chronic microhemorrhages in this region on the prior MRI. These results were called by telephone at the time of interpretation on 12/05/2019 at 4:40 pm to provider [facility] , who verbally acknowledged these results. Electronically Signed   By: Macy Mis M.D.   On: 12/05/2019 17:06   CT ANGIO NECK W OR WO CONTRAST  Result Date: 12/06/2019 CLINICAL DATA:  Neuro deficit, acute, stroke suspected. EXAM: CT ANGIOGRAPHY HEAD AND NECK TECHNIQUE: Multidetector CT imaging of the head and neck was performed using the standard protocol during bolus administration of intravenous contrast. Multiplanar CT image reconstructions and MIPs were obtained to evaluate the vascular anatomy. Carotid stenosis measurements (when applicable) are obtained  utilizing NASCET criteria, using the distal internal carotid diameter as the denominator. CONTRAST:  19m OMNIPAQUE IOHEXOL 350 MG/ML SOLN COMPARISON:  None. FINDINGS: CTA NECK FINDINGS Aortic arch: 3 vessel arch configuration is present. Atherosclerotic changes are present. No significant aneurysm or stenosis is present. Right carotid system: The right common carotid artery is within normal limits. Atherosclerotic changes are present at the bifurcation without significant stenosis. Mild tortuosity is present in distal cervical right ICA without significant stenosis. Left carotid system: Focal tortuosity is present in the cervical left ICA with less than 50% stenosis. Atherosclerotic calcifications are present at the bifurcation and proximal left ICA without significant stenosis. Cervical ICA is otherwise normal. Vertebral arteries: Left vertebral artery is the dominant vessel. Both vertebral arteries originate at the subclavian without significant stenosis. Skeleton: Focal degenerative endplate changes present at C5-6. Facet degenerative changes are worse on the left at C3-4 and C4-5. Vertebral body heights are normal. Alignment is anatomic. No focal lytic or blastic lesions are present. The patient is edentulous. Other neck: Soft tissues the neck are otherwise unremarkable. Upper chest: Centrilobular emphysematous changes are present at the lung apices. No focal nodule mass, or airspace disease is present. Thoracic inlet is within normal limits. Review of the MIP images confirms the above findings CTA HEAD FINDINGS Anterior circulation: Atherosclerotic calcifications are present within the cavernous internal carotid arteries bilaterally. Mild narrowing of less than 50% is present in the left supraclinoid ICA. ICA termini are normal bilaterally. The A1 and M1 segments are normal. The left A1 segment is dominant. The anterior communicating artery is patent. ACA and MCA branch vessels are within normal limits.  Posterior circulation: Left vertebral artery is dominant. PICA origins are visualized and normal. The vertebrobasilar junction is normal. The basilar artery is normal. Both posterior cerebral arteries originate from basilar tip. The PCA branch vessels are within normal limits. Focal lesion is associated with the hemorrhage. Venous sinuses: The dural sinuses are patent. Straight sinus and deep cerebral veins are within normal limits. Cortical veins are unremarkable. No vascular malformation is evident. Anatomic variants: None Review of the MIP images confirms the above findings IMPRESSION: 1. No vascular malformation associated with the hemorrhage. 2. Atherosclerotic changes at the carotid bifurcations bilaterally and cavernous internal carotid arteries bilaterally without significant stenosis. 3. Mild narrowing of less than 50% in the left supraclinoid ICA. 4. No other significant proximal stenosis, aneurysm, or branch vessel occlusion within the Circle of Willis. 5. Multilevel spondylosis of the cervical spine. 6. Aortic Atherosclerosis (ICD10-I70.0) and Emphysema (ICD10-J43.9). Electronically Signed   By: CSan MorelleM.D.   On: 12/06/2019 11:20  MR BRAIN WO CONTRAST  Result Date: 12/05/2019 CLINICAL DATA:  Stroke follow-up EXAM: MRI HEAD WITHOUT CONTRAST TECHNIQUE: Multiplanar, multiecho pulse sequences of the brain and surrounding structures were obtained without intravenous contrast. COMPARISON:  Brain MRI 10/20/2018 Head CT 12/05/2019 FINDINGS: Brain: Unchanged appearance of intraparenchymal hematoma in the posterior left parietal lobe. No acute ischemia. Early confluent hyperintense T2-weighted signal of the periventricular and deep white matter, most commonly due to chronic ischemic microangiopathy. Normal volume of CSF spaces. No chronic microhemorrhage. Normal midline structures. Vascular: Normal flow voids. Skull and upper cervical spine: Normal marrow signal. Sinuses/Orbits: Negative.  Left  ocular lens replacement. Other: None. IMPRESSION: 1. Unchanged appearance of intraparenchymal hematoma in the posterior left parietal lobe. No new mass effect or hydrocephalus. 2. Findings of chronic ischemic microangiopathy. Electronically Signed   By: Ulyses Jarred M.D.   On: 12/05/2019 22:04   ECHOCARDIOGRAM COMPLETE  Result Date: 12/06/2019    ECHOCARDIOGRAM REPORT   Patient Name:   Matthew [name] Date of Exam: 12/06/2019 Medical Rec #:  [id number]     Height:       71.0 in Accession #:    [id number]    Weight:       243.4 lb Date of Birth:  Oct 28, 1945    BSA:          2.292 m Patient Age:    21 years      BP:           134/53 mmHg Patient Gender: M             HR:           84 bpm. Exam Location:  Inpatient Procedure: 2D Echo, Cardiac Doppler, Color Doppler and Intracardiac            Opacification Agent Indications:    Stroke 434.91 / I163.9  History:        Patient has no prior history of Echocardiogram examinations.                 COPD; Risk Factors:Diabetes and Current Smoker.  Sonographer:    Vickie Epley RDCS Referring Phys: [id number] [place]  1. Left ventricular ejection fraction, by estimation, is 60 to 65%. The left ventricle has normal function. The left ventricle has no regional wall motion abnormalities. Left ventricular diastolic parameters are consistent with Grade I diastolic dysfunction (impaired relaxation).  2. Right ventricular systolic function is normal. The right ventricular size is normal.  3. Left atrial size was mildly dilated.  4. The mitral valve is normal in structure. No evidence of mitral valve regurgitation. No evidence of mitral stenosis.  5. The aortic valve is normal in structure. Aortic valve regurgitation is trivial. No aortic stenosis is present.  6. The inferior vena cava is dilated in size with >50% respiratory variability, suggesting right atrial pressure of 8 mmHg. FINDINGS  Left Ventricle: Left ventricular ejection fraction, by estimation, is 60 to 65%.  The left ventricle has normal function. The left ventricle has no regional wall motion abnormalities. Definity contrast agent was given IV to delineate the left ventricular  endocardial borders. The left ventricular internal cavity size was normal in size. There is no left ventricular hypertrophy. Left ventricular diastolic parameters are consistent with Grade I diastolic dysfunction (impaired relaxation). Indeterminate filling pressures. Right Ventricle: The right ventricular size is normal. No increase in right ventricular wall thickness. Right ventricular systolic function is normal. Left Atrium: Left atrial size was mildly dilated. Right  Atrium: Right atrial size was normal in size. Pericardium: There is no evidence of pericardial effusion. Mitral Valve: The mitral valve is normal in structure. Normal mobility of the mitral valve leaflets. No evidence of mitral valve regurgitation. No evidence of mitral valve stenosis. Tricuspid Valve: The tricuspid valve is normal in structure. Tricuspid valve regurgitation is not demonstrated. No evidence of tricuspid stenosis. Aortic Valve: The aortic valve is normal in structure. Aortic valve regurgitation is trivial. No aortic stenosis is present. Pulmonic Valve: The pulmonic valve was normal in structure. Pulmonic valve regurgitation is not visualized. No evidence of pulmonic stenosis. Aorta: The aortic root is normal in size and structure. Venous: The inferior vena cava is dilated in size with greater than 50% respiratory variability, suggesting right atrial pressure of 8 mmHg. IAS/Shunts: No atrial level shunt detected by color flow Doppler.  LEFT VENTRICLE PLAX 2D LVIDd:         5.00 cm      Diastology LVIDs:         3.80 cm      LV e' lateral:   6.31 cm/s LV PW:         0.80 cm      LV E/e' lateral: 10.7 LV IVS:        0.80 cm      LV e' medial:    4.79 cm/s LVOT diam:     2.10 cm      LV E/e' medial:  14.1 LV SV:         72 LV SV Index:   31 LVOT Area:     3.46 cm   LV Volumes (MOD) LV vol d, MOD A2C: 121.0 ml LV vol d, MOD A4C: 101.0 ml LV vol s, MOD A2C: 48.4 ml LV vol s, MOD A4C: 36.6 ml LV SV MOD A2C:     72.6 ml LV SV MOD A4C:     101.0 ml LV SV MOD BP:      68.3 ml RIGHT VENTRICLE RV S prime:     11.00 cm/s TAPSE (M-mode): 1.8 cm LEFT ATRIUM             Index       RIGHT ATRIUM           Index LA diam:        4.20 cm 1.83 cm/m  RA Area:     12.50 cm LA Vol (A2C):   26.8 ml 11.69 ml/m RA Volume:   26.70 ml  11.65 ml/m LA Vol (A4C):   36.2 ml 15.79 ml/m LA Biplane Vol: 32.8 ml 14.31 ml/m  AORTIC VALVE LVOT Vmax:   113.00 cm/s LVOT Vmean:  76.100 cm/s LVOT VTI:    0.208 m  AORTA Ao Root diam: 3.50 cm MITRAL VALVE MV Area (PHT): 4.21 cm    SHUNTS MV Decel Time: 180 msec    Systemic VTI:  0.21 m MV E velocity: 67.70 cm/s  Systemic Diam: 2.10 cm MV A velocity: 86.50 cm/s MV E/A ratio:  0.78 Mihai Croitoru MD Electronically signed by Sanda Klein MD Signature Date/Time: 12/06/2019/3:09:51 PM    Final    CT ANGIO HEAD CODE STROKE  Result Date: 12/06/2019 CLINICAL DATA:  Neuro deficit, acute, stroke suspected. EXAM: CT ANGIOGRAPHY HEAD AND NECK TECHNIQUE: Multidetector CT imaging of the head and neck was performed using the standard protocol during bolus administration of intravenous contrast. Multiplanar CT image reconstructions and MIPs were obtained to evaluate the vascular anatomy. Carotid stenosis measurements (when  applicable) are obtained utilizing NASCET criteria, using the distal internal carotid diameter as the denominator. CONTRAST:  146m OMNIPAQUE IOHEXOL 350 MG/ML SOLN COMPARISON:  None. FINDINGS: CTA NECK FINDINGS Aortic arch: 3 vessel arch configuration is present. Atherosclerotic changes are present. No significant aneurysm or stenosis is present. Right carotid system: The right common carotid artery is within normal limits. Atherosclerotic changes are present at the bifurcation without significant stenosis. Mild tortuosity is present in distal cervical  right ICA without significant stenosis. Left carotid system: Focal tortuosity is present in the cervical left ICA with less than 50% stenosis. Atherosclerotic calcifications are present at the bifurcation and proximal left ICA without significant stenosis. Cervical ICA is otherwise normal. Vertebral arteries: Left vertebral artery is the dominant vessel. Both vertebral arteries originate at the subclavian without significant stenosis. Skeleton: Focal degenerative endplate changes present at C5-6. Facet degenerative changes are worse on the left at C3-4 and C4-5. Vertebral body heights are normal. Alignment is anatomic. No focal lytic or blastic lesions are present. The patient is edentulous. Other neck: Soft tissues the neck are otherwise unremarkable. Upper chest: Centrilobular emphysematous changes are present at the lung apices. No focal nodule mass, or airspace disease is present. Thoracic inlet is within normal limits. Review of the MIP images confirms the above findings CTA HEAD FINDINGS Anterior circulation: Atherosclerotic calcifications are present within the cavernous internal carotid arteries bilaterally. Mild narrowing of less than 50% is present in the left supraclinoid ICA. ICA termini are normal bilaterally. The A1 and M1 segments are normal. The left A1 segment is dominant. The anterior communicating artery is patent. ACA and MCA branch vessels are within normal limits. Posterior circulation: Left vertebral artery is dominant. PICA origins are visualized and normal. The vertebrobasilar junction is normal. The basilar artery is normal. Both posterior cerebral arteries originate from basilar tip. The PCA branch vessels are within normal limits. Focal lesion is associated with the hemorrhage. Venous sinuses: The dural sinuses are patent. Straight sinus and deep cerebral veins are within normal limits. Cortical veins are unremarkable. No vascular malformation is evident. Anatomic variants: None Review of  the MIP images confirms the above findings IMPRESSION: 1. No vascular malformation associated with the hemorrhage. 2. Atherosclerotic changes at the carotid bifurcations bilaterally and cavernous internal carotid arteries bilaterally without significant stenosis. 3. Mild narrowing of less than 50% in the left supraclinoid ICA. 4. No other significant proximal stenosis, aneurysm, or branch vessel occlusion within the Circle of Willis. 5. Multilevel spondylosis of the cervical spine. 6. Aortic Atherosclerosis (ICD10-I70.0) and Emphysema (ICD10-J43.9). Electronically Signed   By: CSan MorelleM.D.   On: 12/06/2019 11:20      HISTORY OF PRESENT ILLNESS Matthew PALUCHis a 74y.o. male with a history of diabetes, CKD who presents with headache since last night.  Because the headache was severe today, he sought care in the emergency department where family also noted that he had seemed slightly confused.  For this reason, he had a head CT performed which demonstrates a parenchymal hemorrhage on the left.  He was severely hypertensive in the 200s on arrival, therefore a code stroke hemorrhage was activated to assist with blood pressure management. He was LKW: 8/1, unclear time. He was admitted to the neuro ICU.    HOSPITAL COURSE Mr. CRASHAWN RAYMANis a 74y.o. male with history of diabetes, CKD  presenting with severe HA and confusion. SBP 200 on arrival.    Stroke:   L parietal ICH, hypertensive  CT head L periatrial white matter IPH w/ mild edema. Chronic microhemorrhages.  MRI  Unchanged L parietal IPH CT head stable hemorrhage   CTA head & neck B ICA atherosclerosis. L ICA < 50% narrowing. Cervical spondylosis. Aortic atherosclerosis. Emphysema.  2D Echo EF 60-65%. No source of embolus. LA mildly dilated LDL UTC d/t TG 948, direct LDL 75.5 HgbA1c 7.3 aspirin 81 mg daily prior to admission, now on No antithrombotic given hemorrhage  Therapy recommendations:  HH PT, HH OT Disposition:  return  home   Hypertensive Emergency BP as high as 200/105 Treated w/ cleviprex, now off Home meds:  Lisinopril 40 Stable SBP goal < 140 x 24h then < 160 in hospital Resumed lisinopril, added amlodipine 5   Long-term BP goal normotensive   Hyperlipidemia LDL  UTC d/t TG 948, direct LDL 75.5, goal LDL < 70  on no statin PTA Hold statin given hemorrhage  Consider lipitor 40 at time of follow up   Diabetes type II Uncontrolled Home meds:  glipizie 5, metformin 1000 bid, jardiance 10 HgbA1c 7.3, goal < 7.0 SSI Resumed glipizide in hospital Will resume other meds at d/c   Other Stroke Risk Factors Advanced age Cigarette smoker, advised to stop smoking. Added nicotine patch 66m daily. Continue x 6 weeks then decrease to 729mx 2 weeks Hx ETOH use, alcohol level <10, advised to drink no more than 2 drink(s) a day Obesity, Body mass index is 33.96 kg/m., recommend weight loss, diet and exercise as appropriate  Migraines on prn depakote   Other Active Problems Cognitive decline w/ poor recall - likely Baseline dementia  COPD w/o exacerbation   CKD stage 2, Cre 0.8  Chest pain, troponin normal  Hypothyroid on synthroid  Polycythemia, Hb 19 - likely d/t smoking  DISCHARGE EXAM Blood pressure (!) 162/72, pulse 99, temperature 97.8 F (36.6 C), resp. rate 20, height 5' 10.98" (1.803 m), weight 110.4 kg, SpO2 90 %. Pleasant elderly Caucasian male not in distress. . Afebrile. Head is nontraumatic. Neck is supple without bruit.    Cardiac exam no murmur or gallop. Lungs are clear to auscultation. Distal pulses are well felt. Neurological Exam ;  Awake  Alert oriented x 3. Normal speech and language mildly diminished attention, registration and recall..eye movements full without nystagmus.fundi were not visualized. Vision acuity and fields appear normal. Hearing is normal. Palatal movements are normal. Face symmetric. Tongue midline. Normal strength, tone, reflexes and coordination. Normal  sensation. Gait deferred.  Discharge Diet   Carb modified, thin liquids  DISCHARGE PLAN Disposition:  Return home Home health PT, OT Due to hemorrhage and risk of bleeding, do not take aspirin, aspirin-containing medications, or ibuprofen products   Ongoing stroke risk factor control by Primary Care Physician at time of discharge Follow-up PCP ElLeonard DowningMD in 2 weeks - please address continued smoking cessation if pt agreeable. [facility], office to schedule an appointment.   32 minutes were spent preparing discharge.  ShBurnetta SabinMSN, APRN, ANVP-BC, AGPCNP-BC Advanced Practice Stroke Nurse [place] Schedule & Pager information 12/07/2019 10:59 AM   I have personally obtained history,examined this patient, reviewed notes, independently viewed imaging studies, participated in medical decision making and plan of care.ROS completed by me personally and pertinent positives fully documented  I have made any additions or clarifications directly to the above note. Agree with note above.    PrAntony ContrasMD Medical Director  [facility] Pager: [phone] 12/07/2019 3:46 PM

## 2019-12-07 NOTE — TOC Transition Note (Signed)
Transition of Care [facility]) - CM/SW Discharge Note   Patient Details  Name: Matthew [name] MRN: [id number] Date of Birth: 1945/05/31  Transition of Care [facility]) CM/SW Contact:  Ella Bodo, RN Phone Number: 12/07/2019, 2:42 PM   Clinical Narrative:   74 y.o. male with a history of diabetes, CKD, thyroid CA, COPD who presents with headache. he sought care in the emergency department where family also noted that he had seemed slightly confused. CT performed which demonstrates a parenchymal hemorrhage on the left. He was severely hypertensive in the 200s.  Prior to admission, patient independent and living at home with girlfriend who can provide 24/7 assistance at discharge.  PT/OT recommending home health follow-up at discharge, and patient agreeable to services.  Referral to [facility], per patient choice; bedside commode recommended, though patient states he has one at home.      Barriers to Discharge: Barriers Resolved   Patient Goals and CMS Choice Patient states their goals for this hospitalization and ongoing recovery are:: to get back home CMS Medicare.gov Compare Post Acute Care list provided to:: Patient Choice offered to / list presented to : Patient                        Discharge Plan and Services   Discharge Planning Services: CM Consult Post Acute Care Choice: Home Health                    HH Arranged: PT, OT [facility] Agency: [place] Date [place]: 12/07/19 Time [place] Agency Contacted: 1200 Representative spoke with at [place]: [place] ([place]) Interventions     Readmission Risk Interventions No flowsheet data found.  Reinaldo Raddle, RN, BSN  Trauma/Neuro ICU Case Manager [phone]

## 2020-01-05 ENCOUNTER — Inpatient Hospital Stay: Payer: Medicare Other | Admitting: Adult Health

## 2020-01-05 NOTE — Progress Notes (Deleted)
[facility] [address] [place]. [place] [address] [phone]       HOSPITAL FOLLOW UP NOTE  [name] Date of Birth:  03/27/1946 Medical Record Number:  [id number]   Reason for Referral:  hospital stroke follow up    SUBJECTIVE:   CHIEF COMPLAINT:  No chief complaint on file.   HPI:   Matthew [name] a 73 y.o.malewith history of diabetes,and CKD who presented on 12/05/2019 with severe HA and confusion. 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  Previously on aspirin which was discontinued given hemorrhage.  Hypertensive emergency with BP 200/105 upon arrival treated with Cleviprex and resumed home dose lisinopril 40 mg daily and added amlodipine 5 mg daily with long-term BP goal normotensive range.  LDL unable to calculate due to TG 948 which are active LDL 75.5 and recommended considering atorvastatin 40 mg daily at follow-up due to acute ICH. Hx of DM with A1c 7.3 and resumed home meds of glipizide, Metformin and Jardiance at discharge.  Other stroke risk factors include advanced age, tobacco use, history of EtOH use, obesity and migraines on as needed Depakote.  Other active problems include baseline decline with poor recall (likely baseline dementia), COPD, CKD stage II, hypothyroidism on Synthroid and polycythemia likely secondary to tobacco use.  Evaluated by therapy and recommended discharge home with [facility] PT/OT.  Stroke:L parietal ICH, hypertensive  CT headL periatrial white matter IPH w/ mild edema. Chronic microhemorrhages.  MRIUnchanged L parietal IPH  CT headstable hemorrhage  CTA head & neckB ICA atherosclerosis. L ICA < 50% narrowing. Cervical spondylosis. Aortic atherosclerosis. Emphysema.  2D EchoEF60-65%. No source of embolus. LA mildly dilated  LDLUTC d/t TG 948, direct LDL 75.5  HgbA1c7.3  aspirin 81 mg dailyprior to admission, now on No antithromboticgiven hemorrhage  Therapy  recommendations:HH PT, HH OT  Disposition:return home     ROS:   14 system review of systems performed and negative with exception of ***  PMH:  Past Medical History:  Diagnosis Date  . Adenomatous polyps   . Benign neoplasm of colon   . Chronic kidney disease    stage 2  . COPD (chronic obstructive pulmonary disease) ([place])   . Diabetes mellitus without complication ([place])   . External hemorrhoids   . Hemorrhoids   . Thyroid cancer ([place])     PSH:  Past Surgical History:  Procedure Laterality Date  . THYROIDECTOMY      Social History:  Social History   Socioeconomic History  . Marital status: Single    Spouse name: Not on file  . Number of children: 4  . Years of education: 8  . Highest education level: Not on file  Occupational History    Comment: retired  Tobacco Use  . Smoking status: Current Every Day Smoker    Packs/day: 1.50    Types: Cigarettes  . Smokeless tobacco: Never Used  . Tobacco comment: Counseling sheet given in exam room for smoking   Vaping Use  . Vaping Use: Unknown  Substance and Sexual Activity  . Alcohol use: Not Currently  . Drug use: No  . Sexual activity: Not on file  Other Topics Concern  . Not on file  Social History Narrative   Lives alone   Caffeine- coffee 6 cups, 1/2 Pepsi maybe   Social Determinants of Health   Financial Resource Strain:   . Difficulty of Paying Living Expenses: Not on file  Food Insecurity:   . Worried About Estate manager/land agent  of Food in the Last Year: Not on file  . Ran Out of Food in the Last Year: Not on file  Transportation Needs:   . Lack of Transportation (Medical): Not on file  . Lack of Transportation (Non-Medical): Not on file  Physical Activity:   . Days of Exercise per Week: Not on file  . Minutes of Exercise per Session: Not on file  Stress:   . Feeling of Stress : Not on file  Social Connections:   . Frequency of Communication with Friends and Family: Not on file  . Frequency of  Social Gatherings with Friends and Family: Not on file  . Attends Religious Services: Not on file  . Active Member of Clubs or Organizations: Not on file  . Attends Archivist Meetings: Not on file  . Marital Status: Not on file  Intimate Partner Violence:   . Fear of Current or Ex-Partner: Not on file  . Emotionally Abused: Not on file  . Physically Abused: Not on file  . Sexually Abused: Not on file    Family History:  Family History  Problem Relation Age of Onset  . Heart attack Father   . Hypertension Father   . Breast cancer Mother   . Hypertension Mother   . Hypertension Brother   . Heart attack Brother   . Heart attack Sister     Medications:   Current Outpatient Medications on File Prior to Visit  Medication Sig Dispense Refill  . amLODipine (NORVASC) 5 MG tablet Take 1 tablet (5 mg total) by mouth daily. 30 tablet 2  . blood glucose meter kit and supplies Dispense based on patient and insurance preference. Use up to four times daily as directed. (FOR ICD-10 E10.9, E11.9). (Patient not taking: Reported on 12/27/2018) 1 each 0  . divalproex (DEPAKOTE) 125 MG DR tablet Take 125-250 mg by mouth at bedtime as needed (SLEEP MOOD AND MIGRAINE PREVENTION).     Marland Kitchen glipiZIDE (GLUCOTROL XL) 5 MG 24 hr tablet Take 5 mg by mouth daily.    Marland Kitchen JARDIANCE 10 MG TABS tablet Take 10 mg by mouth daily.    Marland Kitchen levothyroxine (SYNTHROID) 200 MCG tablet Take 300 mcg by mouth daily before breakfast. Take 1 tablet by mouth once daily then take 2 tablets on the same day each week for hypothyroid    . lisinopril (ZESTRIL) 40 MG tablet Take 1 tablet (40 mg total) by mouth daily. 30 tablet 0  . metFORMIN (GLUCOPHAGE) 1000 MG tablet Take 1 tablet (1,000 mg total) by mouth 2 (two) times daily with a meal.    . nicotine (NICODERM CQ - DOSED IN MG/24 HOURS) 14 mg/24hr patch Place 1 patch (14 mg total) onto the skin daily. 28 patch 0   No current facility-administered medications on file prior to  visit.    Allergies:  No Known Allergies    OBJECTIVE:  Vitals  There were no vitals filed for this visit. There is no height or weight on file to calculate BMI. No exam data present  Post stroke PHQ2-9 No flowsheet data found.    Physical exam General: well developed, well nourished, seated, in no evident distress Head: head normocephalic and atraumatic.   Neck: supple with no carotid or supraclavicular bruits Cardiovascular: regular rate and rhythm, no murmurs Musculoskeletal: no deformity Skin:  no rash/petichiae Vascular:  Normal pulses all extremities   Neurologic Exam Mental Status: Awake and fully alert. Oriented to place and time. Recent and remote memory intact.  Attention span, concentration and fund of knowledge appropriate. Mood and affect appropriate.  Cranial Nerves: Fundoscopic exam reveals sharp disc margins. Pupils equal, briskly reactive to light. Extraocular movements full without nystagmus. Visual fields full to confrontation. Hearing intact. Facial sensation intact. Face, tongue, palate moves normally and symmetrically.  Motor: Normal bulk and tone. Normal strength in all tested extremity muscles. Sensory.: intact to touch , pinprick , position and vibratory sensation.  Coordination: Rapid alternating movements normal in all extremities. Finger-to-nose and heel-to-shin performed accurately bilaterally. Gait and Station: Arises from chair without difficulty. Stance is normal. Gait demonstrates normal stride length and balance Reflexes: 1+ and symmetric. Toes downgoing.     NIHSS  *** Modified Rankin  ***      ASSESSMENT: Matthew [name] is a 74 y.o. year old male presented with severe HA, confusion and hypertensive emergency (BP 200/105) on 12/05/2019 with stroke work-up revealing hypertensive left parietal ICH. Vascular risk factors include DM, HTN, HLD, advanced age, current tobacco use, history of EtOH use, obesity (BMI 33.9) and migraines.       PLAN:  1. Hypertensive L parietal ICH: Residual deficit: ***. Continue {anticoagulants:31417}  and ***  for secondary stroke prevention. Close PCP follow up for aggressive stroke risk factor management  2. HTN: BP goal <130/90. Continue f/u with PCP 3. HLD: LDL goal <70. LDL UTC TG 948, direct LDL 75.5.  F/u with PCP for management as well as prescribing of statin 4. DMII: A1c goal<7.0. Recent A1c 7.3.  Currently on glipizide, Metformin and Jardiance.  Managed by PCP. 5. Tobacco use: 6. Migraines:    Follow up in *** or call earlier if needed   I spent *** minutes of face-to-face and non-face-to-face time with patient.  This included previsit chart review, lab review, study review, order entry, electronic health record documentation, patient education regarding recent stroke, residual deficits, importance of managing stroke risk factors and answered all questions to patient satisfaction     Frann Rider, [facility]  [facility] [address] [place], [place] [address]  Phone [phone] Fax [phone] Note: This document was prepared with digital dictation and possible smart phrase technology. Any transcriptional errors that result from this process are unintentional.

## 2020-01-12 ENCOUNTER — Inpatient Hospital Stay: Payer: Medicare Other | Admitting: Adult Health

## 2020-01-16 ENCOUNTER — Ambulatory Visit: Payer: Medicare Other | Admitting: Podiatry

## 2020-02-08 ENCOUNTER — Other Ambulatory Visit: Payer: Self-pay

## 2020-02-08 ENCOUNTER — Ambulatory Visit: Payer: Medicare Other | Admitting: Adult Health

## 2020-02-08 ENCOUNTER — Encounter: Payer: Self-pay | Admitting: Adult Health

## 2020-02-08 VITALS — BP 140/81 | HR 109 | Ht 70.0 in | Wt 247.0 lb

## 2020-02-08 DIAGNOSIS — I10 Essential (primary) hypertension: Secondary | ICD-10-CM

## 2020-02-08 DIAGNOSIS — E1165 Type 2 diabetes mellitus with hyperglycemia: Secondary | ICD-10-CM

## 2020-02-08 DIAGNOSIS — Z720 Tobacco use: Secondary | ICD-10-CM

## 2020-02-08 DIAGNOSIS — Z9189 Other specified personal risk factors, not elsewhere classified: Secondary | ICD-10-CM

## 2020-02-08 DIAGNOSIS — E785 Hyperlipidemia, unspecified: Secondary | ICD-10-CM | POA: Diagnosis not present

## 2020-02-08 DIAGNOSIS — G3184 Mild cognitive impairment, so stated: Secondary | ICD-10-CM

## 2020-02-08 DIAGNOSIS — I618 Other nontraumatic intracerebral hemorrhage: Secondary | ICD-10-CM

## 2020-02-08 DIAGNOSIS — G43909 Migraine, unspecified, not intractable, without status migrainosus: Secondary | ICD-10-CM

## 2020-02-08 MED ORDER — ATORVASTATIN CALCIUM 40 MG PO TABS: 40.0000 mg | ORAL_TABLET | Freq: Every day | ORAL | 3 refills | Status: DC

## 2020-02-08 NOTE — Patient Instructions (Addendum)
Referral placed to [facility] for evaluation of possible sleep apnea - you will be called to schedule initial evaluation  Start atorvastatin 40mg  daily for secondary stroke prevention  Take depakote 250mg  nightly over the next 2 weeks to help with migraine management  Continue to follow up with PCP regarding cholesterol, blood pressure and diabetes management  Maintain strict control of hypertension with blood pressure goal below 130/90, diabetes with hemoglobin A1c goal below 7.0% and cholesterol with LDL cholesterol (bad cholesterol) goal below 70 mg/dL.      Followup in the future with me in 3 months or call earlier if needed       Thank you for coming to see [place] at [facility]. I hope we have been able to provide you high quality care today.  You may receive a patient satisfaction survey over the next few weeks. We would appreciate your feedback and comments so that we may continue to improve ourselves and the health of our patients.   Hemorrhagic Stroke  A hemorrhagic stroke is the sudden death of brain tissue that occurs when a blood vessel in the brain leaks or bursts (ruptures), causing bleeding in or around the brain(hemorrhage). When this happens, areas of the brain do not get enough oxygen, and blood builds up and presses on areas of the brain. Lack of oxygen and pressure from hemorrhaging can lead to brain damage and death. There are two major types of hemorrhagic stroke:  Intracerebral hemorrhage. This happens if bleeding occurs within the brain tissue.  Subarachnoid hemorrhage. This happens if bleeding occurs in the area between the brain and the membrane that covers the brain (subarachnoid space). Hemorrhagic stroke is a medical emergency. It can cause temporary or permanent brain damage and loss of brain function. What are the causes? This condition is caused by a blood vessel leaking or rupturing, which may be the result of:  Part of a  weakened blood vessel wall bulging or ballooning out (cerebral aneurysm).  A hardened, thin blood vessel cracking open and allowing blood to leak out. Blood vessels may become hardened and thin due to plaque buildup.  Tangled blood vessels in the brain (brain arteriovenous malformation).  Protein buildup in artery walls in the brain (amyloid angiopathy).  Inflamed blood vessels (vasculitis).  A tumor in the brain.  High blood pressure (hypertension). What increases the risk? The following factors may make you more likely to develop this condition:  Hypertension.  Having abnormal blood vessels present since birth (congenital abnormality).  Bleeding disorders, such as hemophilia, sickle cell disease, or liver disease.  The blood becoming too thin while taking blood thinners (anticoagulants).  Aging.  Moderate or heavy alcohol use.  Using drugs, such as cocaine or methamphetamines. What are the signs or symptoms? Symptoms of this condition usually appear suddenly, and may include:  Weakness or numbness of the face, arm, or leg, especially on one side of the body.  Confusion.  Difficulty speaking (aphasia) or understanding speech.  Difficulty seeing out of one or both eyes.  Difficulty walking or moving the arms or legs.  Dizziness.  Loss of balance or coordination.  Seizures.  A severe headache with no known cause. This headache may feel like the worst headache a person has ever experienced. How is this diagnosed? This condition may be diagnosed based on:  Your symptoms.  Your medical history.  A physical exam.  Tests, including: ? Blood tests. ? CT scan. ? MRI. ? CT angiography (CTA) or  magnetic resonance angiography (MRA).  Catheter angiogram. In this procedure, dye is injected through a long, thin tube (catheter) into one of your arteries. Then, X-rays are taken. The X-rays will show whether there is a blockage or a problem in a blood vessel. How is  this treated? This condition is a medical emergency that must be treated in a hospital immediately. The goals of treatment are to stop bleeding, reduce pressure on the brain, relieve symptoms, and prevent complications. Treatment for this condition may include:  Medicines that: ? Lower blood pressure (antihypertensives). ? Relieve pain (analgesics). ? Relieve nausea or vomiting. ? Stop or prevent seizures (anticonvulsants). ? Relieve fever. ? Prevent blood vessels in the brain from spasming in response to bleeding. ? Control bleeding in the brain.  Assisted breathing (ventilation). This involves using a machine to help you breathe (ventilator).  Receiving donated blood products through an IV (transfusion). You will receive cells that help your blood clot.  Placement of a tube (shunt) in the brain to relieve pressure.  Physical, speech, or occupational therapy.  Surgery to stop bleeding, remove a blood clot or tumor, or reduce pressure. Treatment depends on the cause, severity, and duration of symptoms. Medicines and changes to your diet may be used to help treat and manage risk factors for stroke, such as diabetes and high blood pressure. Recovery from hemorrhagic stroke varies widely. Talk with your health care provider about what to expect during your recovery. Follow these instructions at home: Activity  Use a walker or a cane as told by your health care provider.  Return to your normal activities as told by your health care provider. Ask your health care provider what activities are safe for you.  Rest. Rest helps the brain to heal. Make sure you: ? Get plenty of sleep. Avoid staying up late at night. ? Keep a consistent sleep schedule. Try to go to sleep and wake up at about the same time every day. ? Avoid activities that cause physical or mental stress. Lifestyle  Do not drink alcohol if: ? Your health care provider tells you not to drink. ? You are pregnant, may be  pregnant, or are planning to become pregnant.  If you drink alcohol: ? Limit how much you use to:  0-1 drink a day for women.  0-2 drinks a day for men. ? Be aware of how much alcohol is in your drink. In the [place] one drink equals one 12 oz bottle of beer (355 mL), one 5 oz glass of wine (148 mL), or one 1 oz glass of hard liquor (44 mL). General instructions  Do not drive or use heavy machinery until your health care provider approves.  Take over-the-counter and prescription medicines only as told by your health care provider.  Keep all follow-up visits as told by your health care provider, including visits with therapists. This is important. How is this prevented? Your risk of stroke can be decreased by working with your health care provider to treat:  High blood pressure.  High cholesterol.  Diabetes.  Heart disease.  Obesity. Your risk of stroke can also be decreased by quitting smoking, limiting alcohol, and staying physically active. If you take the blood thinner warfarin, have your bloodwork monitored frequently by your health care provider. Contact a health care provider if: You develop any of the following symptoms:  Headaches that keep coming back (chronic headaches).  Nausea.  Vision problems.  Increased sensitivity to noise or light.  Depression or mood  swings.  Anxiety or irritability.  Memory problems.  Difficulty concentrating or paying attention.  Sleep problems.  Feeling tired all of the time. Get help right away if you:  Have a partial or total loss of consciousness.  Are taking blood thinners and you fall or you experience minor injury to the head.  Have a bleeding disorder and you fall or you experience minor trauma to the head.  Have any symptoms of a stroke. "BE FAST" is an easy way to remember the main warning signs of a stroke: ? B - Balance. Signs are dizziness, sudden trouble walking, or loss of balance. ? E - Eyes. Signs are  trouble seeing or a sudden change in vision. ? F - Face. Signs are sudden weakness or numbness of the face, or the face or eyelid drooping on one side. ? A - Arms. Signs are weakness or numbness in an arm. This happens suddenly and usually on one side of the body. ? S - Speech. Signs are sudden trouble speaking, slurred speech, or trouble understanding what people say. ? T - Time. Time to call emergency services. Write down what time symptoms started.  Have other signs of a stroke, such as: ? A sudden, severe headache with no known cause. ? Nausea or vomiting. ? Seizure. These symptoms may represent a serious problem that is an emergency. Do not wait to see if the symptoms will go away. Get medical help right away. Call your local emergency services (911 in the [place] Do not drive yourself to the hospital. Summary  Hemorrhagic stroke is a medical emergency.  Hemorrhagic stroke is caused by bleeding in or around the brain.  Know the signs and symptoms of stroke.  Know your stroke risk factors. Work with your health care provider to decrease your risk of stroke. This information is not intended to replace advice given to you by your health care provider. Make sure you discuss any questions you have with your health care provider. Document Revised: 05/27/2018 Document Reviewed: 05/28/2018 Elsevier Patient Education  [place].

## 2020-02-08 NOTE — Progress Notes (Signed)
[facility] [address] [place]. [place] [address] [phone]       HOSPITAL FOLLOW UP NOTE  [name] Date of Birth:  1945/11/28 Medical Record Number:  [id number]   Reason for Referral:  hospital stroke follow up    SUBJECTIVE:   CHIEF COMPLAINT:  Chief Complaint  Patient presents with  . Hospitalization Follow-up    Hosp f/u for stroke, continues to have headaches on the same side as stroke.   . room 43    with daughter    HPI:   Matthew [name] a 74 y.o.malewith history of diabetes and CKD who presented on 12/05/2019 with severe HA and confusion with SBP 200 on arrival.  Personally reviewed recent hospitalization pertinent progress notes, lab work and imaging with summary provided below.  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  On aspirin 81 mg PTA and discontinued given ICH.  Hypertensive emergency treated with Cleviprex stabilize during admission and restarted home meds lisinopril and added amlodipine 5 mg daily.  LDL UTC d/t TG 948 with direct LDL 75.5 and recommended initiating atorvastatin 40 mg daily at follow-up due to [place].  Uncontrolled DM with A1c 7.3 resuming home medications of glipizide, Metformin and Jardiance.  Other stroke risk factors include advanced age, tobacco use, hx of EtOH use, obesity and migraines but no prior stroke history.  Other active problems include cognitive decline with poor recall likely baseline dementia, COPD, CKD, hypothyroidism and polycythemia.  Evaluated by therapy and recommended discharge home with [facility] PT/OT.   Stroke:L parietal ICH, hypertensive  CT headL periatrial white matter IPH w/ mild edema. Chronic microhemorrhages.  MRIUnchanged L parietal IPH  CT headstable hemorrhage  CTA head & neckB ICA atherosclerosis. L ICA < 50% narrowing. Cervical spondylosis. Aortic atherosclerosis. Emphysema.  2D EchoEF60-65%. No source of embolus. LA mildly  dilated  LDLUTC d/t TG 948, direct LDL 75.5  HgbA1c7.3  aspirin 81 mg dailyprior to admission, now on No antithromboticgiven hemorrhage  Therapy recommendations:HH PT, [place] home  Today, 02/08/2020, Matthew [name] is being seen for hospital follow-up accompanied by his daughter.  He has been stable since discharge and denies any residual deficits.  Denies new or reoccurring stroke/TIA symptoms.  He was released from home health PT/OT but unfortunately sedentary with limited daytime activity or exercise.  Currently living with his girlfriend and is able to maintain ADLs independently but does get assistance with IADLs (baseline).  Does complain of right temporal pain in the same location as his migraine which has been more prominent since his stroke.  Daughter does report frequent napping and daytime fatigue during the day, difficulty sleeping at night and snoring.  Cognition has been stable per daughter without worsening post stroke.  Blood pressure today 140/81 routinely monitors at home which has been stable.  No further concerns.     ROS:   14 system review of systems performed and negative with exception of those listed in HPI  PMH:  Past Medical History:  Diagnosis Date  . Adenomatous polyps   . Benign neoplasm of colon   . Chronic kidney disease    stage 2  . COPD (chronic obstructive pulmonary disease) ([place])   . Diabetes mellitus without complication ([place])   . External hemorrhoids   . Hemorrhoids   . Thyroid cancer ([place])     PSH:  Past Surgical History:  Procedure Laterality Date  . THYROIDECTOMY      Social History:  Social History  Socioeconomic History  . Marital status: Single    Spouse name: Not on file  . Number of children: 4  . Years of education: 8  . Highest education level: Not on file  Occupational History    Comment: retired  Tobacco Use  . Smoking status: Current Every Day Smoker    Packs/day: 1.50    Types:  Cigarettes  . Smokeless tobacco: Never Used  . Tobacco comment: Counseling sheet given in exam room for smoking   Vaping Use  . Vaping Use: Unknown  Substance and Sexual Activity  . Alcohol use: Not Currently  . Drug use: No  . Sexual activity: Not on file  Other Topics Concern  . Not on file  Social History Narrative   Lives alone   Caffeine- coffee 6 cups, 1/2 Pepsi maybe   Social Determinants of Health   Financial Resource Strain:   . Difficulty of Paying Living Expenses: Not on file  Food Insecurity:   . Worried About Charity fundraiser in the Last Year: Not on file  . Ran Out of Food in the Last Year: Not on file  Transportation Needs:   . Lack of Transportation (Medical): Not on file  . Lack of Transportation (Non-Medical): Not on file  Physical Activity:   . Days of Exercise per Week: Not on file  . Minutes of Exercise per Session: Not on file  Stress:   . Feeling of Stress : Not on file  Social Connections:   . Frequency of Communication with Friends and Family: Not on file  . Frequency of Social Gatherings with Friends and Family: Not on file  . Attends Religious Services: Not on file  . Active Member of Clubs or Organizations: Not on file  . Attends Archivist Meetings: Not on file  . Marital Status: Not on file  Intimate Partner Violence:   . Fear of Current or Ex-Partner: Not on file  . Emotionally Abused: Not on file  . Physically Abused: Not on file  . Sexually Abused: Not on file    Family History:  Family History  Problem Relation Age of Onset  . Heart attack Father   . Hypertension Father   . Breast cancer Mother   . Hypertension Mother   . Hypertension Brother   . Heart attack Brother   . Heart attack Sister     Medications:   Current Outpatient Medications on File Prior to Visit  Medication Sig Dispense Refill  . amLODipine (NORVASC) 5 MG tablet Take 1 tablet (5 mg total) by mouth daily. 30 tablet 2  . blood glucose meter kit  and supplies Dispense based on patient and insurance preference. Use up to four times daily as directed. (FOR ICD-10 E10.9, E11.9). 1 each 0  . divalproex (DEPAKOTE) 125 MG DR tablet Take 125-250 mg by mouth at bedtime as needed (SLEEP MOOD AND MIGRAINE PREVENTION).     Marland Kitchen glipiZIDE (GLUCOTROL XL) 5 MG 24 hr tablet Take 5 mg by mouth daily.    Marland Kitchen JARDIANCE 10 MG TABS tablet Take 10 mg by mouth daily.    Marland Kitchen levothyroxine (SYNTHROID) 200 MCG tablet Take 300 mcg by mouth daily before breakfast. Take 1 tablet by mouth once daily then take 2 tablets on the same day each week for hypothyroid    . lisinopril (ZESTRIL) 40 MG tablet Take 1 tablet (40 mg total) by mouth daily. 30 tablet 0  . metFORMIN (GLUCOPHAGE) 1000 MG tablet Take 1 tablet (1,000  mg total) by mouth 2 (two) times daily with a meal.     No current facility-administered medications on file prior to visit.    Allergies:  No Known Allergies    OBJECTIVE:  Physical Exam  Vitals:   02/08/20 0826  BP: 140/81  Pulse: (!) 109  Weight: 247 lb (112 kg)  Height: _0  (1.778 m)   Body mass index is 35.44 kg/m. No exam data present  General: well developed, well nourished,  pleasant elderly Caucasian male, seated, in no evident distress Head: head normocephalic and atraumatic.   Neck: supple with no carotid or supraclavicular bruits Cardiovascular: regular rate and rhythm, no murmurs Musculoskeletal: no deformity Skin:  no rash/petichiae Vascular:  Normal pulses all extremities   Neurologic Exam Mental Status: Awake and fully alert. fluent speech and language.  Disoriented to time and place. Recent and remote memory impaired. Attention span, concentration and fund of knowledge impaired. Mood and affect appropriate.  Cranial Nerves: Fundoscopic exam reveals sharp disc margins. Pupils equal, briskly reactive to light. Extraocular movements full without nystagmus. Visual fields full to confrontation. Hearing intact. Facial sensation  intact. Face, tongue, palate moves normally and symmetrically.  Motor: Normal bulk and tone. Normal strength in all tested extremity muscles except right hip flexor weakness  Sensory.: intact to touch , pinprick , position and vibratory sensation.  Coordination: Rapid alternating movements normal in all extremities. Finger-to-nose and heel-to-shin performed accurately bilaterally. Gait and Station: Arises from chair without difficulty. Stance is normal. Gait demonstrates normal stride length and mild unsteadiness with broad-based gait Reflexes: 1+ and symmetric. Toes downgoing.     NIHSS  0 Modified [place]  2-3      ASSESSMENT: Matthew [name] is a 74 y.o. year old male presented with severe headache, confusion and hypertensive emergency on 12/05/19 with stroke work-up revealing hypertensive left parietal ICH. Vascular risk factors include HTN, HLD, DM, current tobacco use, cognitive impairment, obesity and migraines.      PLAN:  1. Left parietal ICH, hypertensive:  a. Residual deficit: RLE hip flexor weakness and worsening migraine headaches.  Discussed importance of routine physical activity and exercise as he is currently sedentary.  He is not interested in any additional therapy at this time b. Initiate atorvastatin 40 mg daily for secondary stroke prevention.  c. Previously on aspirin but no indication to restart in setting of recent [place] as no prior stroke history or extensive cardiac history d. Close PCP follow up for aggressive stroke risk factor management  2. Cognitive impairment: Cognition currently at baseline without worsening after recent stroke.  This can be further evaluated at follow-up visit if indicated 3. Chronic migraines: Worsening migraine headaches post ICH.  Currently prescribed Depakote 125-236m as needed for sleep, mood and migraines.  Recommend taking Depakote 250 mg nightly over the next couple weeks until further recovery from recent [place]. HTN: BP goal  <130/90.  Stable.  On lisinopril and amlodipine.  Continue f/u with PCP 5. HLD: LDL goal <70. LDL UTC d/t TG 948 with direct LDL 75.5 .  Initiate atorvastatin 40 mg daily and will repeat lipid panel at follow-up visit 6. DMII: A1c goal<7.0. Recent A1c 7.3.  On Metformin, Jardiance and glipizide per PCP 7. Tobacco use: Educated on importance of tobacco cessation and increased risk but he has no interest in tobacco cessation at this time.  Advised him to discuss further with PCP if he wishes to quit in the future if he needs any assistance 8. At  risk for sleep apnea: Referral placed to [facility] for further evaluation of possible sleep apnea with risk factors of recent hypertensive ICH, HTN requiring treatment, morbid obesity, tobacco use, chronic migraines and cognitive impairment with chronic history of insomnia, daytime fatigue and snoring    Follow up in 3 months or call earlier if needed   I spent 45 minutes of face-to-face and non-face-to-face time with patient and daughter.  This included previsit chart review including recent hospitalization pertinent progress notes, lab work and imaging, lab review, study review, order entry, electronic health record documentation, patient education regarding recent stroke, residual deficits, importance of managing stroke risk factors including possible sleep apnea and continued tobacco use, worsening migraines and underlying dementia and answered all questions to patient and daughters satisfaction   Frann Rider, AGNP-BC  [name] [address] [place], [place] [address]  Phone [phone] Fax [phone] Note: This document was prepared with digital dictation and possible smart phrase technology. Any transcriptional errors that result from this process are unintentional.

## 2020-02-09 NOTE — Progress Notes (Signed)
I agree with the above plan 

## 2020-02-14 ENCOUNTER — Other Ambulatory Visit: Payer: Self-pay | Admitting: *Deleted

## 2020-02-14 MED ORDER — ATORVASTATIN CALCIUM 40 MG PO TABS: 40.0000 mg | ORAL_TABLET | Freq: Every day | ORAL | 3 refills | Status: DC

## 2020-02-14 NOTE — Telephone Encounter (Signed)
Called spoke to pts wife.  Yes ok to send to [facility].  I called [facility] and cancelled remaining refills.  No to get at [facility].

## 2020-03-12 ENCOUNTER — Institutional Professional Consult (permissible substitution): Payer: Medicare Other | Admitting: Neurology

## 2020-03-15 ENCOUNTER — Other Ambulatory Visit: Payer: Self-pay

## 2020-03-15 ENCOUNTER — Ambulatory Visit: Payer: Medicare Other | Admitting: Podiatry

## 2020-03-15 DIAGNOSIS — B351 Tinea unguium: Secondary | ICD-10-CM

## 2020-03-15 DIAGNOSIS — M79675 Pain in left toe(s): Secondary | ICD-10-CM

## 2020-03-15 DIAGNOSIS — M79674 Pain in right toe(s): Secondary | ICD-10-CM

## 2020-03-15 DIAGNOSIS — E1165 Type 2 diabetes mellitus with hyperglycemia: Secondary | ICD-10-CM

## 2020-03-15 NOTE — Patient Instructions (Signed)
Diabetes Mellitus and Foot Care Foot care is an important part of your health, especially when you have diabetes. Diabetes may cause you to have problems because of poor blood flow (circulation) to your feet and legs, which can cause your skin to:  Become thinner and drier.  Break more easily.  Heal more slowly.  Peel and crack. You may also have nerve damage (neuropathy) in your legs and feet, causing decreased feeling in them. This means that you may not notice minor injuries to your feet that could lead to more serious problems. Noticing and addressing any potential problems early is the best way to prevent future foot problems. How to care for your feet Foot hygiene  Wash your feet daily with warm water and mild soap. Do not use hot water. Then, pat your feet and the areas between your toes until they are completely dry. Do not soak your feet as this can dry your skin.  Trim your toenails straight across. Do not dig under them or around the cuticle. File the edges of your nails with an emery board or nail file.  Apply a moisturizing lotion or petroleum jelly to the skin on your feet and to dry, brittle toenails. Use lotion that does not contain alcohol and is unscented. Do not apply lotion between your toes. Shoes and socks  Wear clean socks or stockings every day. Make sure they are not too tight. Do not wear knee-high stockings since they may decrease blood flow to your legs.  Wear shoes that fit properly and have enough cushioning. Always look in your shoes before you put them on to be sure there are no objects inside.  To break in new shoes, wear them for just a few hours a day. This prevents injuries on your feet. Wounds, scrapes, corns, and calluses  Check your feet daily for blisters, cuts, bruises, sores, and redness. If you cannot see the bottom of your feet, use a mirror or ask someone for help.  Do not cut corns or calluses or try to remove them with medicine.  If you  find a minor scrape, cut, or break in the skin on your feet, keep it and the skin around it clean and dry. You may clean these areas with mild soap and water. Do not clean the area with peroxide, alcohol, or iodine.  If you have a wound, scrape, corn, or callus on your foot, look at it several times a day to make sure it is healing and not infected. Check for: ? Redness, swelling, or pain. ? Fluid or blood. ? Warmth. ? Pus or a bad smell. General instructions  Do not cross your legs. This may decrease blood flow to your feet.  Do not use heating pads or hot water bottles on your feet. They may burn your skin. If you have lost feeling in your feet or legs, you may not know this is happening until it is too late.  Protect your feet from hot and cold by wearing shoes, such as at the beach or on hot pavement.  Schedule a complete foot exam at least once a year (annually) or more often if you have foot problems. If you have foot problems, report any cuts, sores, or bruises to your health care provider immediately. Contact a health care provider if:  You have a medical condition that increases your risk of infection and you have any cuts, sores, or bruises on your feet.  You have an injury that is not   healing.  You have redness on your legs or feet.  You feel burning or tingling in your legs or feet.  You have pain or cramps in your legs and feet.  Your legs or feet are numb.  Your feet always feel cold.  You have pain around a toenail. Get help right away if:  You have a wound, scrape, corn, or callus on your foot and: ? You have pain, swelling, or redness that gets worse. ? You have fluid or blood coming from the wound, scrape, corn, or callus. ? Your wound, scrape, corn, or callus feels warm to the touch. ? You have pus or a bad smell coming from the wound, scrape, corn, or callus. ? You have a fever. ? You have a red line going up your leg. Summary  Check your feet every day  for cuts, sores, red spots, swelling, and blisters.  Moisturize feet and legs daily.  Wear shoes that fit properly and have enough cushioning.  If you have foot problems, report any cuts, sores, or bruises to your health care provider immediately.  Schedule a complete foot exam at least once a year (annually) or more often if you have foot problems. This information is not intended to replace advice given to you by your health care provider. Make sure you discuss any questions you have with your health care provider. Document Revised: 01/12/2019 Document Reviewed: 05/23/2016 Elsevier Patient Education  2020 Elsevier Inc.  

## 2020-03-19 NOTE — Progress Notes (Signed)
Subjective:   Patient ID: Marchelle Folks, male   DOB: 74 y.o.   MRN: [id number]   HPI 74 year old male presents the office with concerns of thick, discolored, elongated toenails that he cannot trim himself.  He denies any ulcerations any other concerns today.  Last A1c was 7.3 on December 05, 2019   Review of Systems  All other systems reviewed and are negative.  Past Medical History:  Diagnosis Date  . Adenomatous polyps   . Benign neoplasm of colon   . Chronic kidney disease    stage 2  . COPD (chronic obstructive pulmonary disease) ([place])   . Diabetes mellitus without complication ([place])   . External hemorrhoids   . Hemorrhoids   . Thyroid cancer [facility])     Past Surgical History:  Procedure Laterality Date  . THYROIDECTOMY       Current Outpatient Medications:  .  amLODipine (NORVASC) 5 MG tablet, Take 1 tablet (5 mg total) by mouth daily., Disp: 30 tablet, Rfl: 2 .  amoxicillin (AMOXIL) 500 MG capsule, Take 500 mg by mouth 3 (three) times daily., Disp: , Rfl:  .  atorvastatin (LIPITOR) 40 MG tablet, Take 1 tablet (40 mg total) by mouth daily., Disp: 90 tablet, Rfl: 3 .  blood glucose meter kit and supplies, Dispense based on patient and insurance preference. Use up to four times daily as directed. (FOR ICD-10 E10.9, E11.9)., Disp: 1 each, Rfl: 0 .  divalproex (DEPAKOTE) 125 MG DR tablet, Take 125-250 mg by mouth at bedtime as needed (SLEEP MOOD AND MIGRAINE PREVENTION). , Disp: , Rfl:  .  glipiZIDE (GLUCOTROL XL) 5 MG 24 hr tablet, Take 5 mg by mouth daily., Disp: , Rfl:  .  JARDIANCE 10 MG TABS tablet, Take 10 mg by mouth daily., Disp: , Rfl:  .  levothyroxine (SYNTHROID) 200 MCG tablet, Take 300 mcg by mouth daily before breakfast. Take 1 tablet by mouth once daily then take 2 tablets on the same day each week for hypothyroid, Disp: , Rfl:  .  lisinopril (ZESTRIL) 40 MG tablet, Take 1 tablet (40 mg total) by mouth daily., Disp: 30 tablet, Rfl: 0 .  metFORMIN (GLUCOPHAGE) 1000  MG tablet, Take 1 tablet (1,000 mg total) by mouth 2 (two) times daily with a meal., Disp: , Rfl:  .  SYMBICORT 160-4.5 MCG/ACT inhaler, SMARTSIG:2 Puff(s) By Mouth Every 12 Hours, Disp: , Rfl:   No Known Allergies       Objective:  Physical Exam  General: NAD  Dermatological: Nails are hypertrophic, dystrophic, brittle, discolored, elongated 10. No surrounding redness or drainage. Tenderness nails 1-5 bilaterally. No open lesions or pre-ulcerative lesions are identified today.  Vascular: Dorsalis Pedis artery and Posterior Tibial artery pedal pulses are 2/4 bilateral with immedate capillary fill time. There is no pain with calf compression, swelling, warmth, erythema.   Neruologic: Grossly intact via light touch bilateral.  Musculoskeletal: No other areas of discomfort.      Assessment:   Symptomatic onychomycosis, type II diabetes mellitus     Plan:  -Treatment options discussed including all alternatives, risks, and complications -Etiology of symptoms were discussed -Nails debrided 10 without complications or bleeding. -Daily foot inspection -Follow-up in 3 months or sooner if any problems arise. In the meantime, encouraged to call the office with any questions, concerns, change in symptoms.   Celesta Gentile, DPM

## 2020-04-17 ENCOUNTER — Ambulatory Visit: Payer: Medicare Other | Admitting: Neurology

## 2020-04-17 ENCOUNTER — Other Ambulatory Visit: Payer: Self-pay

## 2020-04-17 ENCOUNTER — Encounter: Payer: Self-pay | Admitting: Neurology

## 2020-04-17 VITALS — BP 143/77 | HR 102 | Ht 71.0 in | Wt 245.0 lb

## 2020-04-17 DIAGNOSIS — Z72821 Inadequate sleep hygiene: Secondary | ICD-10-CM

## 2020-04-17 DIAGNOSIS — I10 Essential (primary) hypertension: Secondary | ICD-10-CM

## 2020-04-17 DIAGNOSIS — Z9189 Other specified personal risk factors, not elsewhere classified: Secondary | ICD-10-CM | POA: Diagnosis not present

## 2020-04-17 DIAGNOSIS — I618 Other nontraumatic intracerebral hemorrhage: Secondary | ICD-10-CM

## 2020-04-17 DIAGNOSIS — Z720 Tobacco use: Secondary | ICD-10-CM

## 2020-04-17 DIAGNOSIS — J431 Panlobular emphysema: Secondary | ICD-10-CM

## 2020-04-17 NOTE — Progress Notes (Signed)
SLEEP MEDICINE CLINIC    Provider:  Larey Seat, MD  Primary Care Physician:  Leonard Downing, MD [place] [address]     Referring Provider: Frann Rider, [address] [place],  [place] [address]          Chief Complaint according to patient   Patient presents with:    . New Patient (Initial Visit)           HISTORY OF PRESENT ILLNESS:  Matthew [name] is a 74 year old Caucasian male patient and was seen upon a referral from the [place] team on 04/17/2020 ,from [place].  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  He originally presented with a severe headache, confusion and hypertensive crisis on 05 December 2019 at the stroke work-up then revealed the left parietal ICH.  His vascular risk factors included hypertension, hyperlipidemia, diabetes, ongoing tobacco use, cognitive impairment at baseline obesity and migraines.  It was thought that his cognitive impairment has not worsened after the stroke, but he did have chronic migraine headaches after the bleed.  His hypertension at the time of his discharge was 130/90 and considered stable.  His hyperlipidemia was reduced on atorvastatin 40 mg daily.  With the goal for his diabetic control was not HbA1c below 7 and most recently in the hospital was at 7.3.  At his primary care physician is following him with Metformin, Jardiance and glipizide.  He was also given tobacco cessation class, he is at risk of sleep apnea.  He had a follow-up visit with Rodena Piety cosigned by Dr. Karn Pickler 33 in October of this year when his daughter also stated that her father was sleepy in daytime but sleep slept slept very little at night.  At night.  He prefers for many years to sleep on the so far in the den rather than in his bedroom.  He does  report that he watches TV at night over the TV at least while he is trying to sleep.  I have some additional test results here CT left parietal mild edema chronic microhemorrhages his bleeding seems to have been mostly absorbed by now the MRI confirmed the same as CTA of the head showed atherosclerosis atherosclerosis in the left internal carotid artery but less than 50% narrowing he does have emphysema by chest x-ray he has aortic atherosclerosis as well a 2D echo showed normal ejection fraction 60 to 65% for his cardiac function no source of embolism was identified his left atrium was mildly dilated.  He was discharged home with PT Ht and home health.  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       Sleep relevant medical history: Nocturnal coughing, COPD, Nocturia, ICH, hypertension, thyroid surgery,  Abnormal circadian rhythm.  Nocturnal wheezing and SOB.    Family medical /sleep history: Insomnia in a daughter. .   Social history:  Patient is retired Animator and lives in a household with a male friend. Family status is divorced , a has an adult child. Pets are not present. Tobacco use; cigarettes 30-40 / d.   ETOH use ;  none , quit in 2000,  Caffeine intake in form of Coffee( 4 a day) Soda( /) Tea ( 2-4 a day ) . Regular exercise- SOB prevents this .         Sleep habits are as follows: The patient's dinner time is variable 5-8 Pm. He will snack, go back to the sofa and sleep and watch Tv on and off. The patient goes to bed at variable times and continues to sleep for 2-5  hours, wakes from coughing.    The preferred sleep position is on is side , with the support of 2 pillows.  Dreams are reportedly rare/.  4 AM is the usual rise time. The patient wakes  up spontaneously.  He reports  feeling refreshed and restored in AM, he denies symptoms such as dry mouth, morning headaches , and residual fatigue.  Naps are taken frequently, on and off lasting from 15 minutes to several hours. and are more refreshing than nocturnal sleep.    Review of Systems: Out of a complete 14 system review, the patient complains of only the following symptoms, and all other reviewed systems are negative.:  Fatigue, sleepiness ,  snoring, fragmented sleep, Insomnia - fragmented sleep from SOB, coughing and emphysema.   Has no sleep rhythm or routines.    How likely are you to doze in the following situations: 0 = not likely, 1 = slight chance, 2 = moderate chance, 3 = high chance   Sitting and Reading? Watching Television? Sitting inactive in a public place (theater or meeting)? As a passenger in a car for an hour without a break? Lying down in the afternoon when circumstances permit? Sitting and talking to someone? Sitting quietly after lunch without alcohol? In a car, while stopped for a few minutes in traffic?   Total =9-16/ 24 points   FSS endorsed at 43/ 63 points.   Social History   Socioeconomic History  . Marital status: Single    Spouse name: Not on file  . Number of children: 4  . Years of education: 8  . Highest education level: Not on file  Occupational History    Comment: retired  Tobacco Use  . Smoking status: Current Every Day Smoker    Packs/day: 1.50    Types: Cigarettes  . Smokeless tobacco: Never Used  . Tobacco comment: Counseling sheet given in exam room for smoking   Vaping Use  . Vaping Use: Unknown  Substance and Sexual Activity  . Alcohol use: Not Currently  . Drug use: No  . Sexual activity: Not on file  Other Topics Concern  . Not on file  Social History Narrative   Lives alone   Caffeine- coffee 6 cups, 1/2 Pepsi maybe   Social Determinants of Health   Financial Resource Strain: Not on file  Food Insecurity:  Not on file  Transportation Needs: Not on file  Physical Activity: Not on file  Stress: Not on file  Social Connections: Not on file    Family History  Problem Relation Age of Onset  . Heart attack Father   . Hypertension Father   . Breast cancer Mother   . Hypertension Mother   . Hypertension Brother   . Heart attack Brother   . Heart attack Sister     Past Medical History:  Diagnosis Date  . Adenomatous polyps   . Benign neoplasm of colon   . Chronic kidney disease    stage 2  . COPD (chronic obstructive pulmonary disease) (  [facility])   . Diabetes mellitus without complication ([facility])   . External hemorrhoids   . Hemorrhoids   . Thyroid cancer [facility])     Past Surgical History:  Procedure Laterality Date  . THYROIDECTOMY       Current Outpatient Medications on File Prior to Visit  Medication Sig Dispense Refill  . amLODipine (NORVASC) 5 MG tablet Take 1 tablet (5 mg total) by mouth daily. 30 tablet 2  . amoxicillin (AMOXIL) 500 MG capsule Take 500 mg by mouth 3 (three) times daily.    Marland Kitchen atorvastatin (LIPITOR) 40 MG tablet Take 1 tablet (40 mg total) by mouth daily. 90 tablet 3  . blood glucose meter kit and supplies Dispense based on patient and insurance preference. Use up to four times daily as directed. (FOR ICD-10 E10.9, E11.9). 1 each 0  . divalproex (DEPAKOTE) 125 MG DR tablet Take 125-250 mg by mouth at bedtime as needed (SLEEP MOOD AND MIGRAINE PREVENTION).     Marland Kitchen glipiZIDE (GLUCOTROL XL) 5 MG 24 hr tablet Take 5 mg by mouth daily.    Marland Kitchen JARDIANCE 10 MG TABS tablet Take 10 mg by mouth daily.    Marland Kitchen levothyroxine (SYNTHROID) 200 MCG tablet Take 300 mcg by mouth daily before breakfast. Take 1 tablet by mouth once daily then take 2 tablets on the same day each week for hypothyroid    . lisinopril (ZESTRIL) 40 MG tablet Take 1 tablet (40 mg total) by mouth daily. 30 tablet 0  . metFORMIN (GLUCOPHAGE) 1000 MG tablet Take 1 tablet (1,000 mg total) by mouth 2 (two) times daily  with a meal.    . SYMBICORT 160-4.5 MCG/ACT inhaler SMARTSIG:2 Puff(s) By Mouth Every 12 Hours     No current facility-administered medications on file prior to visit.    Physical exam:  Today's Vitals   04/17/20 1254  BP: (!) 143/77  Pulse: (!) 102  Weight: 245 lb (111.1 kg)  Height: $Remove'5\' 11"'lLFRNgf$  (1.803 m)   Body mass index is 34.17 kg/m.   Wt Readings from Last 3 Encounters:  04/17/20 245 lb (111.1 kg)  02/08/20 247 lb (112 kg)  12/05/19 243 lb 6.2 oz (110.4 kg)     Ht Readings from Last 3 Encounters:  04/17/20 $RemoveB'5\' 11"'sUKMHLew$  (1.803 m)  02/08/20 $RemoveB'5\' 10"'sYdDKEjb$  (1.778 m)  12/05/19 5' 10.98" (1.803 m)      General: The patient is awake, alert and appears SOB, coughing, he is flushed-  in acute respiratory distress. The patient is poorly groomed.  Head: Normocephalic, atraumatic. Neck is supple. Mallampati  3 plus,  neck circumference:18.5  inches . Nasal airflow congested.   Retrognathia is seen.  Dental status: full dentures.  Cardiovascular:  Regular rate and cardiac rhythm by pulse,   without distended neck veins. Respiratory: wheezing, rhales, rhonchi to auscultation.  Skin:  With evidence of ankle edema, not rash. Trunk: The patient's posture is stooped.    Neurologic exam : The patient is slightly drowsy and has reduced hearing. , oriented to place and time.   Memory subjective described as intact.  Attention span & concentration ability appears limited  Speech is fluent,  without  dysarthria, dysphonia or aphasia.  Mood and affect are appropriate.   Cranial nerves: no loss of smell or taste reported  Pupils are equal and briskly reactive to light. Funduscopic exam deferred.   Extraocular movements in vertical and horizontal planes were intact and without nystagmus.  No Diplopia. Visual fields by finger perimetry are intact. Hearing  impaired.  Facial sensation intact to fine touch.  Facial motor strength is symmetric and tongue and uvula move midline.  Neck ROM : rotation, tilt  and flexion extension were normal for age . The shoulder shrug was left side weaker.  Motor exam:  Symmetric bulk, tone and ROM.   Left biceps and triceps tone is more rigid, mild  cog- wheeling, but no weaker left grip strength .   Sensory:  Fine touch, pinprick and vibration were tested  and  normal.  Proprioception tested in the upper extremities was normal.   Coordination: Rapid alternating movements in the left fingers/hand were of slowed speed.  The Finger-to-nose maneuver was impaired on the left    Gait and station: Patient could rise unassisted from a seated position, walked without assistive device.  He appears stooped.  Stance is of normal width/ base and the patient turned with 4 steps.   Toe and heel walk were deferred.  Deep tendon reflexes: in the upper and lower extremities are symmetric and intact.  Babinski response was deferred.      After spending a total time of 45 minutes face to face and additional time for physical and neurologic examination, review of laboratory studies,  personal review of imaging studies, reports and results of other testing and review of referral information / records as far as provided in visit, I have established the following assessments:  1)  This patient suffered a left sided ICH yet shows left sided upper extremity rigor and clumsiness/ he is presenting with chronic wheezing and coughing, poor peripheral circulation and edentulous.  2)  Risk factor was not embolic but HTN related , smoking related.  3)   He is most likely an overlap patient between COPD and OSA.    My Plan is to proceed with:  1) HST  2) I will not repeat smoking cessation  3) I recommend to sleep in his bed or, at least, leave the TV off. He will  need to establish bedtimes and meal times. ll ask him to   I would like to thank Dr. Leonie Man ,MD  and Frann Rider, [address] [place],  [place] [address] for allowing me to meet with and to take care of this pleasant  patient.   In short, BISHOY CUPP is presenting with COPD, poor sleep habits and hygiene and ongoing tobacco abuse, HTN leading to [place]. He denies any long term symptoms but his daughter gave other information.  He will  follow up through our NP within 3-4  month.   CC: I will share my notes with PCP.Marland Kitchen  Electronically signed by: Larey Seat, MD 04/17/2020 1:16 PM  [facility] and [place] certified by The [employer] of Sleep Medicine and Diplomate of the [employer] of Sleep Medicine. Board certified In Neurology through the [place], Fellow of the [employer] of Neurology. Medical Director of [employer].

## 2020-04-17 NOTE — Patient Instructions (Signed)

## 2020-04-23 ENCOUNTER — Telehealth: Payer: Self-pay

## 2020-04-23 NOTE — Telephone Encounter (Signed)
LVM for pt to call me back to schedule sleep study  

## 2020-05-28 NOTE — Progress Notes (Deleted)
[facility] [address] [place]. [place] [address] [phone]       STROKE FOLLOW UP NOTE  [name] Date of Birth:  04/27/1946 Medical Record Number:  [id number]   Reason for Referral: stroke follow up    SUBJECTIVE:   CHIEF COMPLAINT:  No chief complaint on file.   HPI:   Today, 05/28/2020, Matthew [name] returns for 24-month stroke follow-up.  Stable from stroke standpoint without new or reoccurring stroke/TIA symptoms.  Remains on atorvastatin 40 mg daily without myalgias.  Initiated at prior visit -repeat lipid panel ***.  Blood pressure today ***.  Evaluated by Dr. Brett Fairy 12/14 for possible underlying sleep apnea and currently awaiting to schedule HST.  Tobacco use ***.       History provided for reference purposes only Initial visit 02/08/2020 JM: Matthew [name] is being seen for hospital follow-up accompanied by his daughter.  He has been stable since discharge and denies any residual deficits.  Denies new or reoccurring stroke/TIA symptoms.  He was released from home health PT/OT but unfortunately sedentary with limited daytime activity or exercise.  Currently living with his girlfriend and is able to maintain ADLs independently but does get assistance with IADLs (baseline).  Does complain of right temporal pain in the same location as his migraine which has been more prominent since his stroke.  Daughter does report frequent napping and daytime fatigue during the day, difficulty sleeping at night and snoring.  Cognition has been stable per daughter without worsening post stroke.  Blood pressure today 140/81 routinely monitors at home which has been stable.  No further concerns.  Stroke admission 12/05/2019 Matthew [name] a 75 y.o.malewith history of diabetes and CKD who presented on 12/05/2019 with severe HA and confusion with SBP 200 on arrival.  Personally reviewed recent hospitalization pertinent progress notes, lab work and imaging with summary  provided below.  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  On aspirin 81 mg PTA and discontinued given ICH.  Hypertensive emergency treated with Cleviprex stabilize during admission and restarted home meds lisinopril and added amlodipine 5 mg daily.  LDL UTC d/t TG 948 with direct LDL 75.5 and recommended initiating atorvastatin 40 mg daily at follow-up due to [place].  Uncontrolled DM with A1c 7.3 resuming home medications of glipizide, Metformin and Jardiance.  Other stroke risk factors include advanced age, tobacco use, hx of EtOH use, obesity and migraines but no prior stroke history.  Other active problems include cognitive decline with poor recall likely baseline dementia, COPD, CKD, hypothyroidism and polycythemia.  Evaluated by therapy and recommended discharge home with [facility].   Stroke:L parietal ICH, hypertensive  CT headL periatrial white matter IPH w/ mild edema. Chronic microhemorrhages.  MRIUnchanged L parietal IPH  CT headstable hemorrhage  CTA head & neckB ICA atherosclerosis. L ICA < 50% narrowing. Cervical spondylosis. Aortic atherosclerosis. Emphysema.  2D EchoEF60-65%. No source of embolus. LA mildly dilated  LDLUTC d/t TG 948, direct LDL 75.5  HgbA1c7.3  aspirin 81 mg dailyprior to admission, now on No antithromboticgiven hemorrhage  Therapy recommendations:HH PT, HH OT  Disposition:return home     ROS:   14 system review of systems performed and negative with exception of those listed in HPI  PMH:  Past Medical History:  Diagnosis Date   Adenomatous polyps    Benign neoplasm of colon    Chronic kidney disease    stage 2   COPD (chronic obstructive pulmonary disease) ([place])  Diabetes mellitus without complication ([facility])    External hemorrhoids    Hemorrhoids    Thyroid cancer ([facility])     PSH:  Past Surgical History:  Procedure Laterality Date   THYROIDECTOMY      Social History:  Social  History   Socioeconomic History   Marital status: Single    Spouse name: Not on file   Number of children: 4   Years of education: 8   Highest education level: Not on file  Occupational History    Comment: retired  Tobacco Use   Smoking status: Current Every Day Smoker    Packs/day: 1.50    Types: Cigarettes   Smokeless tobacco: Never Used   Tobacco comment: Counseling sheet given in exam room for smoking   Vaping Use   Vaping Use: Unknown  Substance and Sexual Activity   Alcohol use: Not Currently   Drug use: No   Sexual activity: Not on file  Other Topics Concern   Not on file  Social History Narrative   Lives alone   Caffeine- coffee 6 cups, 1/2 Pepsi maybe   Social Determinants of Health   Financial Resource Strain: Not on file  Food Insecurity: Not on file  Transportation Needs: Not on file  Physical Activity: Not on file  Stress: Not on file  Social Connections: Not on file  Intimate Partner Violence: Not on file    Family History:  Family History  Problem Relation Age of Onset   Heart attack Father    Hypertension Father    Breast cancer Mother    Hypertension Mother    Hypertension Brother    Heart attack Brother    Heart attack Sister     Medications:   Current Outpatient Medications on File Prior to Visit  Medication Sig Dispense Refill   amLODipine (NORVASC) 5 MG tablet Take 1 tablet (5 mg total) by mouth daily. 30 tablet 2   amoxicillin (AMOXIL) 500 MG capsule Take 500 mg by mouth 3 (three) times daily.     atorvastatin (LIPITOR) 40 MG tablet Take 1 tablet (40 mg total) by mouth daily. 90 tablet 3   blood glucose meter kit and supplies Dispense based on patient and insurance preference. Use up to four times daily as directed. (FOR ICD-10 E10.9, E11.9). 1 each 0   divalproex (DEPAKOTE) 125 MG DR tablet Take 125-250 mg by mouth at bedtime as needed (SLEEP MOOD AND MIGRAINE PREVENTION).      glipiZIDE (GLUCOTROL XL) 5 MG 24  hr tablet Take 5 mg by mouth daily.     JARDIANCE 10 MG TABS tablet Take 10 mg by mouth daily.     levothyroxine (SYNTHROID) 200 MCG tablet Take 300 mcg by mouth daily before breakfast. Take 1 tablet by mouth once daily then take 2 tablets on the same day each week for hypothyroid     lisinopril (ZESTRIL) 40 MG tablet Take 1 tablet (40 mg total) by mouth daily. 30 tablet 0   metFORMIN (GLUCOPHAGE) 1000 MG tablet Take 1 tablet (1,000 mg total) by mouth 2 (two) times daily with a meal.     SYMBICORT 160-4.5 MCG/ACT inhaler SMARTSIG:2 Puff(s) By Mouth Every 12 Hours     No current facility-administered medications on file prior to visit.    Allergies:  No Known Allergies    OBJECTIVE:  Physical Exam  There were no vitals filed for this visit. There is no height or weight on file to calculate BMI. No exam data present  General: well developed, well nourished,  pleasant elderly Caucasian male, seated, in no evident distress Head: head normocephalic and atraumatic.   Neck: supple with no carotid or supraclavicular bruits Cardiovascular: regular rate and rhythm, no murmurs Musculoskeletal: no deformity Skin:  no rash/petichiae Vascular:  Normal pulses all extremities   Neurologic Exam Mental Status: Awake and fully alert. fluent speech and language.  Disoriented to time and place. Recent and remote memory impaired. Attention span, concentration and fund of knowledge impaired. Mood and affect appropriate.  Cranial Nerves: Pupils equal, briskly reactive to light. Extraocular movements full without nystagmus. Visual fields full to confrontation. Hearing intact. Facial sensation intact. Face, tongue, palate moves normally and symmetrically.  Motor: Normal bulk and tone. Normal strength in all tested extremity muscles except right hip flexor weakness  Sensory.: intact to touch , pinprick , position and vibratory sensation.  Coordination: Rapid alternating movements normal in all  extremities. Finger-to-nose and heel-to-shin performed accurately bilaterally. Gait and Station: Arises from chair without difficulty. Stance is normal. Gait demonstrates normal stride length and mild unsteadiness with broad-based gait Reflexes: 1+ and symmetric. Toes downgoing.        ASSESSMENT: Matthew [name] is a 75 y.o. year old male presented with severe headache, confusion and hypertensive emergency on 12/05/19 with stroke work-up revealing hypertensive left parietal ICH. Vascular risk factors include HTN, HLD, DM, current tobacco use, cognitive impairment, obesity and migraines.      PLAN:  1. Left parietal ICH, hypertensive:  a. Residual deficit: RLE hip flexor weakness and worsening migraine headaches.  Discussed importance of routine physical activity and exercise as he is currently sedentary.  He is not interested in any additional therapy at this time b. Initiate atorvastatin 40 mg daily for secondary stroke prevention.  c. Previously on aspirin but no indication to restart in setting of recent [place] as no prior stroke history or extensive cardiac history d. Close PCP follow up for aggressive stroke risk factor management  2. Cognitive impairment: Cognition currently at baseline without worsening after recent stroke.  This can be further evaluated at follow-up visit if indicated 3. Chronic migraines: Worsening migraine headaches post ICH.  Currently prescribed Depakote 125-250mg  as needed for sleep, mood and migraines.  Recommend taking Depakote 250 mg nightly over the next couple weeks until further recovery from recent [place] 4. HTN: BP goal <130/90.  Stable.  On lisinopril and amlodipine.  Continue f/u with PCP 5. HLD: LDL goal <70. LDL UTC d/t TG 948 with direct LDL 75.5 .  Initiated atorvastatin 40 mg daily at prior visit -repeat lipid panel  6. DMII: A1c goal<7.0. Recent A1c 7.3.  On Metformin, Jardiance and glipizide per PCP 7. Tobacco use: Educated on importance of tobacco  cessation and increased risk but he has no interest in tobacco cessation at this time.  Advised him to discuss further with PCP if he wishes to quit in the future if he needs any assistance 8. At risk for sleep apnea: Evaluated by Dr. Brett Fairy 12/14 currently awaiting to schedule HST    Follow up in 3 months or call earlier if needed   I spent 45 minutes of face-to-face and non-face-to-face time with patient and daughter.  This included previsit chart review including recent hospitalization pertinent progress notes, lab work and imaging, lab review, study review, order entry, electronic health record documentation, patient education regarding recent stroke, residual deficits, importance of managing stroke risk factors including possible sleep apnea and continued tobacco use, worsening migraines and underlying  dementia and answered all questions to patient and daughters satisfaction   Frann Rider, [facility]  [facility] [address] [place], [place] [address]  Phone [phone] Fax [phone] Note: This document was prepared with digital dictation and possible smart phrase technology. Any transcriptional errors that result from this process are unintentional.

## 2020-05-29 ENCOUNTER — Ambulatory Visit: Payer: Medicare Other | Admitting: Adult Health

## 2020-06-04 ENCOUNTER — Ambulatory Visit (INDEPENDENT_AMBULATORY_CARE_PROVIDER_SITE_OTHER): Payer: Medicare Other | Admitting: Neurology

## 2020-06-04 DIAGNOSIS — G4734 Idiopathic sleep related nonobstructive alveolar hypoventilation: Secondary | ICD-10-CM

## 2020-06-04 DIAGNOSIS — Z9189 Other specified personal risk factors, not elsewhere classified: Secondary | ICD-10-CM

## 2020-06-04 DIAGNOSIS — Z720 Tobacco use: Secondary | ICD-10-CM

## 2020-06-04 DIAGNOSIS — J431 Panlobular emphysema: Secondary | ICD-10-CM

## 2020-06-04 DIAGNOSIS — Z72821 Inadequate sleep hygiene: Secondary | ICD-10-CM

## 2020-06-04 DIAGNOSIS — D751 Secondary polycythemia: Secondary | ICD-10-CM

## 2020-06-04 DIAGNOSIS — G4733 Obstructive sleep apnea (adult) (pediatric): Secondary | ICD-10-CM

## 2020-06-04 DIAGNOSIS — I10 Essential (primary) hypertension: Secondary | ICD-10-CM

## 2020-06-04 DIAGNOSIS — J449 Chronic obstructive pulmonary disease, unspecified: Secondary | ICD-10-CM

## 2020-06-04 DIAGNOSIS — I618 Other nontraumatic intracerebral hemorrhage: Secondary | ICD-10-CM

## 2020-06-07 NOTE — Progress Notes (Signed)
   [facility] ([facility])  STUDY DATE: 06/07/20  DOB: November 19, 1945  MRN: [id number]  ORDERING CLINICIAN: Larey Seat, MD   REFERRING CLINICIAN: Frann Rider, NP STROKE TEAM   CLINICAL INFORMATION/HISTORY: Matthew [name] a 75 year old Caucasian male patientand was seen upon a referralfrom the STROKE team on 04/17/2020, sleep consult requested by Frann Rider.  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  He originally presented with a severe headache, confusion and hypertensive crisis on 05 December 2019 and the stroke work-up then revealed the left parietal ICH.  His vascular risk factors included hypertension, hyperlipidemia, diabetes, ongoing tobacco use, cognitive impairment at baseline obesity and migraines.  It was thought that his cognitive impairment has not worsened after the stroke, but he did have chronic migraine headaches after the bleed.  He reports severe daytime sleepiness. He has emphysema and sleep little at night and much in daytime. He coughs frequently.   Epworth sleepiness score: 16/24. BMI: 34.3 kg/m Neck Circumference: 18.5 "  FINDINGS:   Total Record Time (hours, min): 4 h 5 min Total Sleep Time (hours, min):  2 h 19 min   Percent REM (%):    N/A   Calculated pAHI (per hour):  16.8       REM pAHI: N/A    NREM pAHI: N/A Supine AHI: N/A   Oxygen Saturation (%) Mean: 87  Minimum oxygen saturation (%):        76  O2 Saturation Range (%): 76-95  O2Saturation (minutes) <=88%: 61.4 min  Pulse Mean (bpm):    89  Pulse Range (57-109)   IMPRESSION: This very time limited HST ( sleep time recorded was just over 2 hours)  Revealed moderate OSA (obstructive sleep apnea) with an AHI of 16.8/h and failed to capture any REM sleep, supine sleep.  More than 50% of the sleep time was in hypoxia, with oxygen  nadir at 76%. Total time in hypoxia was 61.4 minutes.   RECOMMENDATION:  The degree of hypoxemia makes this an urgent sleep patient - Please repeat this HST or have patient come to the sleep lab for PAP titration with possible oxygen addition. We can titrate to oxygen if CPAP doesn't lift the hypoxemia.     INTERPRETING PHYSICIAN:  Larey Seat, MD  [facility] and [facility] Sleep Board certified by The [employer] of Sleep Medicine and  Fellow of the [employer] of Neurology. Medical Director of [employer].

## 2020-06-19 ENCOUNTER — Encounter: Payer: Self-pay | Admitting: Neurology

## 2020-06-19 DIAGNOSIS — Z9189 Other specified personal risk factors, not elsewhere classified: Secondary | ICD-10-CM | POA: Insufficient documentation

## 2020-06-19 DIAGNOSIS — G4733 Obstructive sleep apnea (adult) (pediatric): Secondary | ICD-10-CM | POA: Insufficient documentation

## 2020-06-19 DIAGNOSIS — I10 Essential (primary) hypertension: Secondary | ICD-10-CM | POA: Insufficient documentation

## 2020-06-19 DIAGNOSIS — Z720 Tobacco use: Secondary | ICD-10-CM | POA: Insufficient documentation

## 2020-06-19 DIAGNOSIS — J431 Panlobular emphysema: Secondary | ICD-10-CM | POA: Insufficient documentation

## 2020-06-19 DIAGNOSIS — Z72821 Inadequate sleep hygiene: Secondary | ICD-10-CM | POA: Insufficient documentation

## 2020-06-19 DIAGNOSIS — J449 Chronic obstructive pulmonary disease, unspecified: Secondary | ICD-10-CM | POA: Insufficient documentation

## 2020-06-19 NOTE — Progress Notes (Signed)
   His daughter reports severe and excessive daytime sleepiness. He has emphysema and sleep little at night and much in daytime. He coughs frequently.   IMPRESSION: This very time limited HST ( sleep time recorded was just over 2 hours)  Revealed moderate OSA (obstructive sleep apnea) with an AHI of 16.8/h and failed to capture any REM sleep, supine sleep.  More than 50% of the sleep time was in hypoxia, with oxygen nadir at 76%. Total time in hypoxia was 61.4 minutes.   RECOMMENDATION:  The degree of hypoxemia makes this an urgent sleep patient - Please repeat this HST or have patient come to the sleep lab for PAP titration with possible oxygen addition. We can titrate to oxygen if CPAP doesn't lift the hypoxemia.    Please share with his PCP and pulmonologist.

## 2020-06-19 NOTE — Procedures (Signed)
[facility] ([facility])  STUDY DATE: 06/07/20  DOB: November 19, 1945  MRN: [id number]  ORDERING CLINICIAN: Larey Seat, MD   REFERRING CLINICIAN: Frann Rider, NP STROKE TEAM   CLINICAL INFORMATION/HISTORY: Matthew [name] a 75 year old Caucasian male patientand was seen upon a referralfrom the STROKE team on 04/17/2020, sleep consult requested by Frann Rider.  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  He originally presented with a severe headache, confusion and hypertensive crisis on 05 December 2019 and the stroke work-up then revealed the left parietal ICH.  His vascular risk factors included hypertension, hyperlipidemia, diabetes, ongoing tobacco use, cognitive impairment at baseline obesity and migraines.  It was thought that his cognitive impairment has not worsened after the stroke, but he did have chronic migraine headaches after the bleed.  He reports severe daytime sleepiness. He has emphysema and sleep little at night and much in daytime. He coughs frequently.   Epworth sleepiness score: 16/24. BMI: 34.3 kg/m Neck Circumference: 18.5 "  FINDINGS:   Total Record Time (hours, min): 4 h 5 min Total Sleep Time (hours, min):  2 h 19 min   Percent REM (%):    N/A   Calculated pAHI (per hour):  16.8       REM pAHI: N/A    NREM pAHI: N/A Supine AHI: N/A   Oxygen Saturation (%) Mean: 87  Minimum oxygen saturation (%):        76  O2 Saturation Range (%): 76-95  O2Saturation (minutes) <=88%: 61.4 min  Pulse Mean (bpm):    89  Pulse Range (57-109)   IMPRESSION: This very time limited HST ( sleep time recorded was just over 2 hours)  Revealed moderate OSA (obstructive sleep apnea) with an AHI of 16.8/h and failed to capture any REM sleep, supine sleep.  More than 50% of the sleep time was in hypoxia, with oxygen  nadir at 76%. Total time in hypoxia was 61.4 minutes.   RECOMMENDATION:  The degree of hypoxemia makes this an urgent sleep patient - Please repeat this HST or have patient come to the sleep lab for PAP titration with possible oxygen addition. We can titrate to oxygen if CPAP doesn't lift the hypoxemia.     INTERPRETING PHYSICIAN:  Larey Seat, MD  [facility] and [facility] Sleep Board certified by The [employer] of Sleep Medicine and  Fellow of the [employer] of Neurology. Medical Director of [employer].

## 2020-06-19 NOTE — Addendum Note (Signed)
Addended by: Larey Seat on: 06/19/2020 05:49 PM   Modules accepted: Orders

## 2020-06-20 ENCOUNTER — Ambulatory Visit: Payer: Medicare Other | Admitting: Podiatry

## 2020-06-20 NOTE — Progress Notes (Signed)
Thanks [employer]. We will call him and see what he prefers

## 2020-06-21 ENCOUNTER — Telehealth: Payer: Self-pay | Admitting: Neurology

## 2020-06-21 NOTE — Telephone Encounter (Signed)
-----   Message from Larey Seat, MD sent at 06/19/2020  5:49 PM EST -----   His daughter reports severe and excessive daytime sleepiness. He has emphysema and sleep little at night and much in daytime. He coughs frequently.   IMPRESSION: This very time limited HST ( sleep time recorded was just over 2 hours)  Revealed moderate OSA (obstructive sleep apnea) with an AHI of 16.8/h and failed to capture any REM sleep, supine sleep.  More than 50% of the sleep time was in hypoxia, with oxygen nadir at 76%. Total time in hypoxia was 61.4 minutes.   RECOMMENDATION:  The degree of hypoxemia makes this an urgent sleep patient - Please repeat this HST or have patient come to the sleep lab for PAP titration with possible oxygen addition. We can titrate to oxygen if CPAP doesn't lift the hypoxemia.    Please share with his PCP and pulmonologist.

## 2020-06-21 NOTE — Telephone Encounter (Signed)
Called and spent 20 minutes on the phone with the daughter.  I reviewed the sleep study in detail advising that there was only 2 hours of data on the home sleep test.  Informed that even though it was only 2 hours, we were able to determine there was moderate degree of sleep apnea present.  Advised the patient's daughter that Dr. Brett Fairy would really like to repeat a home sleep test or bring him in for a titration study.  Patient's daughter states he will not come in for an lab study.  Patient's daughter also indicates his normal sleep is very broken, staying up late, sleeping off and on throughout the day.  I educated that treatment for this apnea would mean CPAP therapy.  Informed her what CPAP therapy looks like.  Advised that if we repeated the home sleep study we would use that information in order to get him set up with a CPAP.  Wanted to make sure he would follow through with treatment before repeating a study.  The daughter says that she will do everything she can to ensure he does.  At this time they will look out for a call from the sleep lab to repeat the home sleep study.  She is asked that a copy of the report be sent to her.  She provided me the address to forward the report.  Patient's daughter was appreciative for the call and the information and had no further questions.

## 2020-06-26 ENCOUNTER — Telehealth: Payer: Self-pay

## 2020-06-26 ENCOUNTER — Other Ambulatory Visit: Payer: Self-pay | Admitting: Neurology

## 2020-06-26 DIAGNOSIS — G4733 Obstructive sleep apnea (adult) (pediatric): Secondary | ICD-10-CM

## 2020-06-26 DIAGNOSIS — Z72821 Inadequate sleep hygiene: Secondary | ICD-10-CM

## 2020-06-26 DIAGNOSIS — G3184 Mild cognitive impairment, so stated: Secondary | ICD-10-CM

## 2020-06-26 NOTE — Telephone Encounter (Signed)
LVM for pt to call me back to schedule sleep study  

## 2020-06-26 NOTE — Telephone Encounter (Signed)
Spoke with patients daughter. She states that patient is unable to come into sleep lab to do CPAP titration sleep study. She states that he hasn't had a bath in a year. He hardly leaves his home. Pt has bad memory issues and would not be a good idea to have him here. Offered for daughter to stay with patient. She states that he refuses to come. Pt daughter would like other options than to bring pt in for the CPAP titration sleep study.

## 2020-06-26 NOTE — Telephone Encounter (Signed)
  Matthew [name]  06/26/20 11:52 AM Note Spoke with patients daughter. She states that patient is unable to come into sleep lab to do CPAP titration sleep study. She states that he hasn't had a bath in a year. He hardly leaves his home. Pt has bad memory issues and would not be a good idea to have him here. Offered for daughter to stay with patient. She states that he refuses to come. Pt daughter would like other options than to bring pt in for the CPAP titration sleep study.

## 2020-06-26 NOTE — Telephone Encounter (Signed)
We are not likely to be successful in this case, he is an inappropriate patient for home or lab- we can try auto CPAP 5-16 cm water, 3 cm EPR for 30 days, rental - and only a nasal cradle or pillow- he wont be able to handle complicated headgear.  Give this info to daughter- if he can't use it we are done about sleep. Who was referring him ? Dr Leonie Man?     PCP may want to involve home safety.

## 2020-06-26 NOTE — Telephone Encounter (Signed)
Noted, I have routed this question/concern to Dr Brett Fairy

## 2020-06-27 NOTE — Telephone Encounter (Signed)
Given Mr. Sustaita is unable to come into the sleep lab, Dr. Brett Fairy is okay with attempting an auto CPAP.  I will send the order to a company that accepts his insurance and they will be in contact with him as soon as they have the inventory to get him set up.  I have advised the compliance guidelines with the patient's daughter.  Informed that the patient would need to use the machine greater than 4 hours each night/day.  Informed the daughter that a initial CPAP visit also will be needed within 31 to 90 days from the date he picks up the machine.  Will send to [employer] for a in-home visit for the patient. Daughter verbalized understanding.

## 2020-08-20 ENCOUNTER — Encounter: Payer: Self-pay | Admitting: Adult Health

## 2020-08-20 ENCOUNTER — Ambulatory Visit: Payer: Medicare Other | Admitting: Adult Health

## 2020-08-20 ENCOUNTER — Telehealth: Payer: Self-pay | Admitting: Neurology

## 2020-08-20 NOTE — Progress Notes (Deleted)
[facility] [address] [place]. [place] [address] [phone]       STROKE FOLLOW UP NOTE  [name] Date of Birth:  Oct 14, 1945 Medical Record Number:  [id number]   Reason for Referral: stroke follow up    SUBJECTIVE:   CHIEF COMPLAINT:  No chief complaint on file.   HPI:   Today, 08/20/2020, Matthew [name] returns for 79-monthstroke follow-up.  Doing well from a stroke standpoint without residual deficits or new stroke/TIA symptoms.  Reports compliance on atorvastatin 40 mg daily without associated side effects.  Blood pressure today ***.  Underwent HST although despite limited recording time, dx'd with moderate sleep apnea with AHI 16.8/h and hypoxia with oxygen nadir 76% for greater than 50% of sleep time.  Due to patient's cognitive baseline, unable to complete repeat in lab sleep test therefore CPAP initiated.    History provided for reference purposes only Initial visit 02/08/2020 JM: Matthew [name] being seen for hospital follow-up accompanied by his daughter.  He has been stable since discharge and denies any residual deficits.  Denies new or reoccurring stroke/TIA symptoms.  He was released from home health PT/OT but unfortunately sedentary with limited daytime activity or exercise.  Currently living with his girlfriend and is able to maintain ADLs independently but does get assistance with IADLs (baseline).  Does complain of right temporal pain in the same location as his migraine which has been more prominent since his stroke.  Daughter does report frequent napping and daytime fatigue during the day, difficulty sleeping at night and snoring.  Cognition has been stable per daughter without worsening post stroke.  Blood pressure today 140/81 routinely monitors at home which has been stable.  No further concerns.  Stroke admission 12/05/2019 Matthew ScripterScottis a 75 y.o.malewith history of diabetes and CKD who presented on 12/05/2019 with severe HA and  confusion with SBP 200 on arrival.  Personally reviewed recent hospitalization pertinent progress notes, lab work and imaging with summary provided below.  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  On aspirin 81 mg PTA and discontinued given ICH.  Hypertensive emergency treated with Cleviprex stabilize during admission and restarted home meds lisinopril and added amlodipine 5 mg daily.  LDL UTC d/t TG 948 with direct LDL 75.5 and recommended initiating atorvastatin 40 mg daily at follow-up due to [place]  Uncontrolled DM with A1c 7.3 resuming home medications of glipizide, Metformin and Jardiance.  Other stroke risk factors include advanced age, tobacco use, hx of EtOH use, obesity and migraines but no prior stroke history.  Other active problems include cognitive decline with poor recall likely baseline dementia, COPD, CKD, hypothyroidism and polycythemia.  Evaluated by therapy and [facility]   Stroke:L parietal ICH, hypertensive  CT headL periatrial white matter IPH w/ mild edema. Chronic microhemorrhages.  MRIUnchanged L parietal IPH  CT headstable hemorrhage  CTA head & neckB ICA atherosclerosis. L ICA < 50% narrowing. Cervical spondylosis. Aortic atherosclerosis. Emphysema.  2D EchoEF60-65%. No source of embolus. LA mildly dilated  LDLUTC d/t TG 948, direct LDL 75.5  HgbA1c7.3  aspirin 81 mg dailyprior to admission, now on No antithromboticgiven hemorrhage  Therapy recommendations:HH PT, HH OT  Disposition:return home     ROS:   14 system review of systems performed and negative with exception of those listed in HPI  PMH:  Past Medical History:  Diagnosis Date  . Adenomatous polyps   . Benign neoplasm of colon   .  Chronic kidney disease    stage 2  . COPD (chronic obstructive pulmonary disease) ([place])   . Diabetes mellitus without complication ([place])   . External hemorrhoids   . Hemorrhoids   .  Thyroid cancer ([place])     PSH:  Past Surgical History:  Procedure Laterality Date  . THYROIDECTOMY      Social History:  Social History   Socioeconomic History  . Marital status: Single    Spouse name: Not on file  . Number of children: 4  . Years of education: 8  . Highest education level: Not on file  Occupational History    Comment: retired  Tobacco Use  . Smoking status: Current Every Day Smoker    Packs/day: 1.50    Types: Cigarettes  . Smokeless tobacco: Never Used  . Tobacco comment: Counseling sheet given in exam room for smoking   Vaping Use  . Vaping Use: Unknown  Substance and Sexual Activity  . Alcohol use: Not Currently  . Drug use: No  . Sexual activity: Not on file  Other Topics Concern  . Not on file  Social History Narrative   Lives alone   Caffeine- coffee 6 cups, 1/2 Pepsi maybe   Social Determinants of Health   Financial Resource Strain: Not on file  Food Insecurity: Not on file  Transportation Needs: Not on file  Physical Activity: Not on file  Stress: Not on file  Social Connections: Not on file  Intimate Partner Violence: Not on file    Family History:  Family History  Problem Relation Age of Onset  . Heart attack Father   . Hypertension Father   . Breast cancer Mother   . Hypertension Mother   . Hypertension Brother   . Heart attack Brother   . Heart attack Sister     Medications:   Current Outpatient Medications on File Prior to Visit  Medication Sig Dispense Refill  . amLODipine (NORVASC) 5 MG tablet Take 1 tablet (5 mg total) by mouth daily. 30 tablet 2  . amoxicillin (AMOXIL) 500 MG capsule Take 500 mg by mouth 3 (three) times daily.    Marland Kitchen atorvastatin (LIPITOR) 40 MG tablet Take 1 tablet (40 mg total) by mouth daily. 90 tablet 3  . blood glucose meter kit and supplies Dispense based on patient and insurance preference. Use up to four times daily as directed. (FOR ICD-10 E10.9, E11.9). 1 each 0  . divalproex (DEPAKOTE) 125  MG DR tablet Take 125-250 mg by mouth at bedtime as needed (SLEEP MOOD AND MIGRAINE PREVENTION).     Marland Kitchen glipiZIDE (GLUCOTROL XL) 5 MG 24 hr tablet Take 5 mg by mouth daily.    Marland Kitchen JARDIANCE 10 MG TABS tablet Take 10 mg by mouth daily.    Marland Kitchen levothyroxine (SYNTHROID) 200 MCG tablet Take 300 mcg by mouth daily before breakfast. Take 1 tablet by mouth once daily then take 2 tablets on the same day each week for hypothyroid    . lisinopril (ZESTRIL) 40 MG tablet Take 1 tablet (40 mg total) by mouth daily. 30 tablet 0  . metFORMIN (GLUCOPHAGE) 1000 MG tablet Take 1 tablet (1,000 mg total) by mouth 2 (two) times daily with a meal.    . SYMBICORT 160-4.5 MCG/ACT inhaler SMARTSIG:2 Puff(s) By Mouth Every 12 Hours     No current facility-administered medications on file prior to visit.    Allergies:  No Known Allergies    OBJECTIVE:  Physical Exam  There were no vitals  filed for this visit. There is no height or weight on file to calculate BMI. No exam data present  General: well developed, well nourished,  pleasant elderly Caucasian male, seated, in no evident distress Head: head normocephalic and atraumatic.   Neck: supple with no carotid or supraclavicular bruits Cardiovascular: regular rate and rhythm, no murmurs Musculoskeletal: no deformity Skin:  no rash/petichiae Vascular:  Normal pulses all extremities   Neurologic Exam Mental Status: Awake and fully alert. fluent speech and language.  Disoriented to time and place. Recent and remote memory impaired. Attention span, concentration and fund of knowledge impaired. Mood and affect appropriate.  Cranial Nerves: Fundoscopic exam reveals sharp disc margins. Pupils equal, briskly reactive to light. Extraocular movements full without nystagmus. Visual fields full to confrontation. Hearing intact. Facial sensation intact. Face, tongue, palate moves normally and symmetrically.  Motor: Normal bulk and tone. Normal strength in all tested extremity  muscles except right hip flexor weakness  Sensory.: intact to touch , pinprick , position and vibratory sensation.  Coordination: Rapid alternating movements normal in all extremities. Finger-to-nose and heel-to-shin performed accurately bilaterally. Gait and Station: Arises from chair without difficulty. Stance is normal. Gait demonstrates normal stride length and mild unsteadiness with broad-based gait Reflexes: 1+ and symmetric. Toes downgoing.         ASSESSMENT: SEBASTION JUN is a 75 y.o. year old male presented with severe headache, confusion and hypertensive emergency on 12/05/19 with stroke work-up revealing hypertensive left parietal ICH. Vascular risk factors include HTN, HLD, DM, current tobacco use, cognitive impairment, obesity and migraines.      PLAN:  1. Left parietal ICH, hypertensive:  a. Residual deficit: RLE hip flexor weakness and worsening migraine headaches.  Discussed importance of routine physical activity and exercise as he is currently sedentary.  He is not interested in any additional therapy at this time b. Initiate atorvastatin 40 mg daily for secondary stroke prevention.  c. Previously on aspirin but no indication to restart in setting of recent [place] as no prior stroke history or extensive cardiac history d. Close PCP follow up for aggressive stroke risk factor management  e. HTN: BP goal <130/90.  Stable.  On lisinopril and amlodipine.  Continue f/u with PCP f. HLD: LDL goal <70. LDL UTC d/t TG 948 with direct LDL 75.5 .  Initiate atorvastatin 40 mg daily and will repeat lipid panel at follow-up visit g. DMII: A1c goal<7.0. Recent A1c 7.3.  On Metformin, Jardiance and glipizide per PCP 2. Cognitive impairment: Cognition currently at baseline without worsening after recent stroke.  This can be further evaluated at follow-up visit if indicated 3. Chronic migraines: Worsening migraine headaches post ICH.  Currently prescribed Depakote 125-233m as needed for  sleep, mood and migraines.  Recommend taking Depakote 250 mg nightly over the next couple weeks until further recovery from recent [place]. Tobacco use: Educated on importance of tobacco cessation and increased risk but he has no interest in tobacco cessation at this time.  Advised him to discuss further with PCP if he wishes to quit in the future if he needs any assistance 5. Moderate sleep apnea: s/p HST 06/19/2020 with AHI 16.8/h and more than 50% sleep time and hypoxia although limited HST as recorded sleep time just over 2 hours.    Follow up in 3 months or call earlier if needed   I spent 45 minutes of face-to-face and non-face-to-face time with patient and daughter.  This included previsit chart review including recent hospitalization pertinent  progress notes, lab work and imaging, lab review, study review, order entry, electronic health record documentation, patient education regarding recent stroke, residual deficits, importance of managing stroke risk factors including possible sleep apnea and continued tobacco use, worsening migraines and underlying dementia and answered all questions to patient and daughters satisfaction   Frann Rider, AGNP-BC  [facility] [address] [place], [place] [address]  Phone [phone] Fax [phone] Note: This document was prepared with digital dictation and possible smart phrase technology. Any transcriptional errors that result from this process are unintentional.

## 2020-08-20 NOTE — Telephone Encounter (Signed)
Pt's daughter, Bergen Magner called, cancel appt because weather too bad to bring him out

## 2020-08-20 NOTE — Telephone Encounter (Signed)
Noted. Has appt 08-22-20 with JM/NP.

## 2020-08-22 ENCOUNTER — Ambulatory Visit: Payer: Medicare Other | Admitting: Adult Health

## 2020-09-11 ENCOUNTER — Encounter: Payer: Self-pay | Admitting: Adult Health

## 2020-09-11 ENCOUNTER — Ambulatory Visit: Payer: Medicare Other | Admitting: Adult Health

## 2020-09-11 VITALS — BP 125/72 | HR 109 | Ht 70.0 in | Wt 255.0 lb

## 2020-09-11 DIAGNOSIS — G4733 Obstructive sleep apnea (adult) (pediatric): Secondary | ICD-10-CM

## 2020-09-11 DIAGNOSIS — Z720 Tobacco use: Secondary | ICD-10-CM | POA: Diagnosis not present

## 2020-09-11 DIAGNOSIS — I10 Essential (primary) hypertension: Secondary | ICD-10-CM | POA: Diagnosis not present

## 2020-09-11 DIAGNOSIS — I618 Other nontraumatic intracerebral hemorrhage: Secondary | ICD-10-CM

## 2020-09-11 DIAGNOSIS — G3184 Mild cognitive impairment, so stated: Secondary | ICD-10-CM | POA: Diagnosis not present

## 2020-09-11 NOTE — Patient Instructions (Addendum)
Continue atorvastatin  for secondary stroke prevention  Highly recommend complete tobacco cessation as this greatly increases your risk of stroke and heart disease  Continue to follow up with PCP regarding cholesterol, blood pressure and diabetes management  Maintain strict control of hypertension with blood pressure goal below 130/90, diabetes with hemoglobin A1c goal below 6.5% and cholesterol with LDL cholesterol (bad cholesterol) goal below 70 mg/dL.   Once you start CPAP, please call us to schedule a follow up 60-90 days after starting per insurance purposes        Thank you for coming to see [place] at [name]. I hope we have been able to provide you high quality care today.  You may receive a patient satisfaction survey over the next few weeks. We would appreciate your feedback and comments so that we may continue to improve ourselves and the health of our patients.     Stroke Prevention Some medical conditions and lifestyle choices can lead to a higher risk for a stroke. You can help to prevent a stroke by eating healthy foods and exercising. It also helps to not smoke and to manage any health problems you may have. How can this condition affect me? A stroke is an emergency. It should be treated right away. A stroke can lead to brain damage or threaten your life. There is a better chance of surviving and getting better after a stroke if you get medical help right away. What can increase my risk? The following medical conditions may increase your risk of a stroke:  Diseases of the heart and blood vessels (cardiovascular disease).  High blood pressure (hypertension).  Diabetes.  High cholesterol.  Sickle cell disease.  Problems with blood clotting.  Being very overweight.  Sleeping problems (obstructivesleep apnea). Other risk factors include:  Being older than age 40.  A history of blood clots, stroke, or mini-stroke (TIA).  Race, ethnic  background, or a family history of stroke.  Smoking or using tobacco products.  Taking birth control pills, especially if you smoke.  Heavy alcohol and drug use.  Not being active. What actions can I take to prevent this? Manage your health conditions  High cholesterol. ? Eat a healthy diet. If this is not enough to manage your cholesterol, you may need to take medicines. ? Take medicines as told by your doctor.  High blood pressure. ? Try to keep your blood pressure below 130/80. ? If your blood pressure cannot be managed through a healthy diet and regular exercise, you may need to take medicines. ? Take medicines as told by your doctor. ? Ask your doctor if you should check your blood pressure at home. ? Have your blood pressure checked every year.  Diabetes. ? Eat a healthy diet and get regular exercise. If your blood sugar (glucose) cannot be managed through diet and exercise, you may need to take medicines. ? Take medicines as told by your doctor.  Talk to your doctor about getting checked for sleeping problems. Signs of a problem can include: ? Snoring a lot. ? Feeling very tired.  Make sure that you manage any other conditions you have. Nutrition  Follow instructions from your doctor about what to eat or drink. You may be told to: ? Eat and drink fewer calories each day. ? Limit how much salt (sodium) you use to 1,500 milligrams (mg) each day. ? Use only healthy fats for cooking, such as olive oil, canola oil, and sunflower oil. ? Eat healthy foods.  To do this:  Choose foods that are high in fiber. These include whole grains, and fresh fruits and vegetables.  Eat at least 5 servings of fruits and vegetables a day. Try to fill one-half of your plate with fruits and vegetables at each meal.  Choose low-fat (lean) proteins. These include low-fat cuts of meat, chicken without skin, fish, tofu, beans, and nuts.  Eat low-fat dairy products. ? Avoid foods that:  Are  high in salt.  Have saturated fat.  Have trans fat.  Have cholesterol.  Are processed or pre-made. ? Count how many carbohydrates you eat and drink each day.   Lifestyle  If you drink alcohol: ? Limit how much you have to:  0-1 drink a day for women who are not pregnant.  0-2 drinks a day for men. ? Know how much alcohol is in your drink. In the [place] one drink equals one 12 oz bottle of beer (370mL), one 5 oz glass of wine (144mL), or one 1 oz glass of hard liquor (58mL).  Do not smoke or use any products that have nicotine or tobacco. If you need help quitting, ask your doctor.  Avoid secondhand smoke.  Do not use drugs. Activity  Try to stay at a healthy weight.  Get at least 30 minutes of exercise on most days, such as: ? Fast walking. ? Biking. ? Swimming.   Medicines  Take over-the-counter and prescription medicines only as told by your doctor.  Avoid taking birth control pills. Talk to your doctor about the risks of taking birth control pills if: ? You are over 36 years old. ? You smoke. ? You get very bad headaches. ? You have had a blood clot. Where to find more information  American Stroke Association: www.strokeassociation.org Get help right away if:  You or a loved one has any signs of a stroke. "BE FAST" is an easy way to remember the warning signs: ? B - Balance. Dizziness, sudden trouble walking, or loss of balance. ? E - Eyes. Trouble seeing or a change in how you see. ? F - Face. Sudden weakness or loss of feeling of the face. The face or eyelid may droop on one side. ? A - Arms. Weakness or loss of feeling in an arm. This happens all of a sudden and most often on one side of the body. ? S - Speech. Sudden trouble speaking, slurred speech, or trouble understanding what people say. ? T - Time. Time to call emergency services. Write down what time symptoms started.  You or a loved one has other signs of a stroke, such as: ? A sudden, very bad  headache with no known cause. ? Feeling like you may vomit (nausea). ? Vomiting. ? A seizure. These symptoms may be an emergency. Get help right away. Call your local emergency services (911 in the [place]  Do not wait to see if the symptoms will go away.  Do not drive yourself to the hospital. Summary  You can help to prevent a stroke by eating healthy, exercising, and not smoking. It also helps to manage any health problems you have.  Do not smoke or use any products that contain nicotine or tobacco.  Get help right away if you or a loved one has any signs of a stroke. This information is not intended to replace advice given to you by your health care provider. Make sure you discuss any questions you have with your health care provider. Document Revised: 11/21/2019  Document Reviewed: 11/21/2019 Elsevier Patient Education  [place].

## 2020-09-11 NOTE — Progress Notes (Signed)
[facility] [address] [place]. [place] [address] [phone]       STROKE FOLLOW UP NOTE  Matthew [name] Date of Birth:  22-Apr-1946 Medical Record Number:  [id number]   Reason for Referral: stroke follow up    SUBJECTIVE:   CHIEF COMPLAINT:  Chief Complaint  Patient presents with  . Follow-up    Rm14 with daughter Matthew [name]) Pt is ok, no complaints     HPI:   Today, 09/11/2020, Matthew [name] returns for 35-monthstroke follow-up accompanied by his daughter, AJanace [name]  Doing well from a stroke standpoint without residual deficits or new stroke/TIA symptoms.  Previously reported headaches but these have since resolved.  Reports compliance on atorvastatin 40 mg daily without associated side effects.  Blood pressure today 125/72.  Monitors at home and has been stable. Continued tobacco use 1-1.5pack/day without plans on quitting.  He continues to live in his own home with his girlfriend and is able to maintain ADLs independently with family checking on him and assisting routinely. Per daughter, was seen by PCP Dr. EArelia Sneddonyesterday with lab work completed.  Underwent HST although despite limited recording time, dx'd with moderate sleep apnea with AHI 16.8/h and hypoxia with oxygen nadir 76% for greater than 50% of sleep time.  Due to patient's cognitive baseline, unable to complete repeat in lab sleep test therefore CPAP initiated.  Due to CPAP backorder, they are still awaiting to obtain CPAP machine.  No further concerns at this time    History provided for reference purposes only Initial visit 02/08/2020 JM: Mr. SErringtonis being seen for hospital follow-up accompanied by his daughter.  He has been stable since discharge and denies any residual deficits.  Denies new or reoccurring stroke/TIA symptoms.  He was released from home health PT/OT but unfortunately sedentary with limited daytime activity or exercise.  Currently living with his girlfriend and is able to maintain  ADLs independently but does get assistance with IADLs (baseline).  Does complain of right temporal pain in the same location as his migraine which has been more prominent since his stroke.  Daughter does report frequent napping and daytime fatigue during the day, difficulty sleeping at night and snoring.  Cognition has been stable per daughter without worsening post stroke.  Blood pressure today 140/81 routinely monitors at home which has been stable.  No further concerns.  Stroke admission 12/05/2019 Matthew ZilkaScottis a 75 y.o.malewith history of diabetes and CKD who presented on 12/05/2019 with severe HA and confusion with SBP 200 on arrival.  Personally reviewed recent hospitalization pertinent progress notes, lab work and imaging with summary provided below.  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  On aspirin 81 mg PTA and discontinued given ICH.  Hypertensive emergency treated with Cleviprex stabilize during admission and restarted home meds lisinopril and added amlodipine 5 mg daily.  LDL UTC d/t TG 948 with direct LDL 75.5 and recommended initiating atorvastatin 40 mg daily at follow-up due to [place]  Uncontrolled DM with A1c 7.3 resuming home medications of glipizide, Metformin and Jardiance.  Other stroke risk factors include advanced age, tobacco use, hx of EtOH use, obesity and migraines but no prior stroke history.  Other active problems include cognitive decline with poor recall likely baseline dementia, COPD, CKD, hypothyroidism and polycythemia.  Evaluated by therapy and [facility]   Stroke:L parietal ICH, hypertensive  CT headL periatrial white matter IPH w/ mild edema. Chronic microhemorrhages.  MRIUnchanged L parietal  IPH  CT headstable hemorrhage  CTA head & neckB ICA atherosclerosis. L ICA < 50% narrowing. Cervical spondylosis. Aortic atherosclerosis. Emphysema.  2D EchoEF60-65%. No source of embolus. LA mildly  dilated  LDLUTC d/t TG 948, direct LDL 75.5  HgbA1c7.3  aspirin 81 mg dailyprior to admission, now on No antithromboticgiven hemorrhage  Therapy recommendations:HH PT, HH OT  Disposition:return home     ROS:   14 system review of systems performed and negative with exception of those listed in HPI  PMH:  Past Medical History:  Diagnosis Date  . Adenomatous polyps   . Benign neoplasm of colon   . Chronic kidney disease    stage 2  . COPD (chronic obstructive pulmonary disease) (Matthew [name])   . Diabetes mellitus without complication ([place])   . External hemorrhoids   . Hemorrhoids   . Thyroid cancer (Matthew [name])     PSH:  Past Surgical History:  Procedure Laterality Date  . THYROIDECTOMY      Social History:  Social History   Socioeconomic History  . Marital status: Single    Spouse name: Not on file  . Number of children: 4  . Years of education: 8  . Highest education level: Not on file  Occupational History    Comment: retired  Tobacco Use  . Smoking status: Current Every Day Smoker    Packs/day: 1.50    Types: Cigarettes  . Smokeless tobacco: Never Used  . Tobacco comment: Counseling sheet given in exam room for smoking   Vaping Use  . Vaping Use: Unknown  Substance and Sexual Activity  . Alcohol use: Not Currently  . Drug use: No  . Sexual activity: Not on file  Other Topics Concern  . Not on file  Social History Narrative   Lives alone   Caffeine- coffee 6 cups, 1/2 Pepsi maybe   Social Determinants of Health   Financial Resource Strain: Not on file  Food Insecurity: Not on file  Transportation Needs: Not on file  Physical Activity: Not on file  Stress: Not on file  Social Connections: Not on file  Intimate Partner Violence: Not on file    Family History:  Family History  Problem Relation Age of Onset  . Heart attack Father   . Hypertension Father   . Breast cancer Mother   . Hypertension Mother   . Hypertension Brother   . Heart  attack Brother   . Heart attack Sister     Medications:   Current Outpatient Medications on File Prior to Visit  Medication Sig Dispense Refill  . amLODipine (NORVASC) 5 MG tablet Take 1 tablet (5 mg total) by mouth daily. 30 tablet 2  . atorvastatin (LIPITOR) 40 MG tablet Take 1 tablet (40 mg total) by mouth daily. 90 tablet 3  . blood glucose meter kit and supplies Dispense based on patient and insurance preference. Use up to four times daily as directed. (FOR ICD-10 E10.9, E11.9). 1 each 0  . divalproex (DEPAKOTE) 125 MG DR tablet Take 125-250 mg by mouth at bedtime as needed (SLEEP MOOD AND MIGRAINE PREVENTION).     Marland Kitchen glipiZIDE (GLUCOTROL XL) 5 MG 24 hr tablet Take 5 mg by mouth daily.    Marland Kitchen JARDIANCE 10 MG TABS tablet Take 10 mg by mouth daily.    Marland Kitchen levothyroxine (SYNTHROID) 200 MCG tablet Take 300 mcg by mouth daily before breakfast. Take 1 tablet by mouth once daily then take 2 tablets on the same day each week for hypothyroid    .  lisinopril (ZESTRIL) 40 MG tablet Take 1 tablet (40 mg total) by mouth daily. 30 tablet 0  . metFORMIN (GLUCOPHAGE) 1000 MG tablet Take 1 tablet (1,000 mg total) by mouth 2 (two) times daily with a meal.    . SYMBICORT 160-4.5 MCG/ACT inhaler SMARTSIG:2 Puff(s) By Mouth Every 12 Hours    . triamterene-hydrochlorothiazide (MAXZIDE) 75-50 MG tablet Take by mouth.     No current facility-administered medications on file prior to visit.    Allergies:  No Known Allergies    OBJECTIVE:  Physical Exam  Vitals:   09/11/20 0839  BP: 125/72  Pulse: (!) 109  Weight: 255 lb (115.7 kg)  Height: 5' 10" (1.778 m)   Body mass index is 36.59 kg/m. No exam data present  General: Obese pleasant elderly Caucasian male, seated, in no evident distress Head: head normocephalic and atraumatic.   Neck: supple with no carotid or supraclavicular bruits Cardiovascular: regular rate and rhythm, no murmurs Musculoskeletal: no deformity Skin:  no rash/petichiae; BLE  edema Vascular:  Normal pulses all extremities   Neurologic Exam Mental Status: Awake and fully alert. fluent speech and language.  Disoriented to time and place. Recent and remote memory impaired. Attention span, concentration and fund of knowledge impaired with daughter providing majority of history. Mood and affect appropriate.  Cranial Nerves: Pupils equal, briskly reactive to light. Extraocular movements full without nystagmus. Visual fields full to confrontation. Hearing intact. Facial sensation intact. Face, tongue, palate moves normally and symmetrically.  Motor: Normal bulk and tone. Normal strength in all tested extremity muscles  Sensory.: intact to touch , pinprick , position and vibratory sensation.  Coordination: Rapid alternating movements normal in all extremities. Finger-to-nose and heel-to-shin performed accurately bilaterally. Gait and Station: Arises from chair without difficulty. Stance is normal. Gait demonstrates  broad-based gait with mild unsteadiness without use of assistive device Reflexes: 1+ and symmetric. Toes downgoing.         ASSESSMENT: Matthew [name] is a 75 y.o. year old male presented with severe headache, confusion and hypertensive emergency on 12/05/19 with stroke work-up revealing hypertensive left parietal ICH. Vascular risk factors include HTN, HLD, DM, current tobacco use, cognitive impairment, obesity, migraines and new diagnosis of OSA.      PLAN:  1. Left parietal ICH, hypertensive:  a. Recovered well without residual deficit b. Continue atorvastatin 40 mg daily for secondary stroke prevention.  c. No indication to restart in setting of recent [place] as no prior stroke history or extensive cardiac history d. Discussed secondary stroke prevention measures and importance of close PCP follow up for aggressive stroke risk factor management  e. HTN: BP goal <130/90.  Stable.  On lisinopril, Maxide and amlodipine.  Continue f/u with PCP f. HLD: LDL  goal <70.  On atorvastatin 40 mg daily - lipid panel obtained by PCP yesterday per daughter - request results be faxed to office as unable to view via epic g. DMII: A1c goal<7.0. On Metformin, Jardiance and glipizide per PCP -request recent lab work by PCP be faxed to office 2. Cognitive impairment: Stable 3. Chronic migraines: Stable 4. Tobacco use: Again discussed importance of complete tobacco cessation but unfortunately he has no interest or desire in quitting at this time.  He verbalized understanding of increased risk with continued use 5. Moderate sleep apnea: s/p HST 06/19/2020 with AHI 16.8/h and more than 50% sleep time and hypoxia although limited HST as recorded sleep time just over 2 hours.  Currently awaiting for CPAP machine (as  currently on back order).  Advised daughter to call office once started for 60-90 day follow up visit per insurance purposes for initial compliance visit    CC:  [place] provider: Dr. Kristin Bruins, Curt Jews, MD    Frann Rider, [facility]  [facility] [address] [place], [place] [address]  Phone [phone] Fax [phone] Note: This document was prepared with digital dictation and possible smart phrase technology. Any transcriptional errors that result from this process are unintentional.

## 2020-09-13 NOTE — Progress Notes (Signed)
I agree with the above plan 

## 2020-09-18 ENCOUNTER — Ambulatory Visit: Payer: Medicare Other | Admitting: Adult Health

## 2020-10-17 ENCOUNTER — Ambulatory Visit: Payer: Self-pay | Admitting: Cardiology

## 2020-11-01 ENCOUNTER — Emergency Department (HOSPITAL_COMMUNITY)
Admission: EM | Admit: 2020-11-01 | Discharge: 2020-11-01 | Disposition: A | Discharge status: Home / Self Care | Payer: Medicare Other | Attending: Emergency Medicine | Admitting: Emergency Medicine

## 2020-11-01 ENCOUNTER — Ambulatory Visit (INDEPENDENT_AMBULATORY_CARE_PROVIDER_SITE_OTHER): Payer: Medicare Other | Admitting: Podiatry

## 2020-11-01 ENCOUNTER — Other Ambulatory Visit: Payer: Self-pay

## 2020-11-01 DIAGNOSIS — R000 Tachycardia, unspecified: Secondary | ICD-10-CM | POA: Insufficient documentation

## 2020-11-01 DIAGNOSIS — Z720 Tobacco use: Secondary | ICD-10-CM

## 2020-11-01 DIAGNOSIS — Z85038 Personal history of other malignant neoplasm of large intestine: Secondary | ICD-10-CM | POA: Diagnosis not present

## 2020-11-01 DIAGNOSIS — F17210 Nicotine dependence, cigarettes, uncomplicated: Secondary | ICD-10-CM | POA: Diagnosis not present

## 2020-11-01 DIAGNOSIS — E1165 Type 2 diabetes mellitus with hyperglycemia: Secondary | ICD-10-CM | POA: Insufficient documentation

## 2020-11-01 DIAGNOSIS — R062 Wheezing: Secondary | ICD-10-CM | POA: Insufficient documentation

## 2020-11-01 DIAGNOSIS — Z85850 Personal history of malignant neoplasm of thyroid: Secondary | ICD-10-CM | POA: Insufficient documentation

## 2020-11-01 DIAGNOSIS — J449 Chronic obstructive pulmonary disease, unspecified: Secondary | ICD-10-CM | POA: Diagnosis not present

## 2020-11-01 DIAGNOSIS — Z7984 Long term (current) use of oral hypoglycemic drugs: Secondary | ICD-10-CM | POA: Insufficient documentation

## 2020-11-01 DIAGNOSIS — E1122 Type 2 diabetes mellitus with diabetic chronic kidney disease: Secondary | ICD-10-CM | POA: Insufficient documentation

## 2020-11-01 DIAGNOSIS — Z5329 Procedure and treatment not carried out because of patient's decision for other reasons: Secondary | ICD-10-CM

## 2020-11-01 DIAGNOSIS — N182 Chronic kidney disease, stage 2 (mild): Secondary | ICD-10-CM | POA: Diagnosis not present

## 2020-11-01 DIAGNOSIS — Z79899 Other long term (current) drug therapy: Secondary | ICD-10-CM | POA: Insufficient documentation

## 2020-11-01 DIAGNOSIS — R739 Hyperglycemia, unspecified: Secondary | ICD-10-CM | POA: Diagnosis present

## 2020-11-01 DIAGNOSIS — E039 Hypothyroidism, unspecified: Secondary | ICD-10-CM | POA: Diagnosis not present

## 2020-11-01 DIAGNOSIS — Z7951 Long term (current) use of inhaled steroids: Secondary | ICD-10-CM | POA: Diagnosis not present

## 2020-11-01 DIAGNOSIS — R42 Dizziness and giddiness: Secondary | ICD-10-CM | POA: Diagnosis not present

## 2020-11-01 DIAGNOSIS — L03116 Cellulitis of left lower limb: Secondary | ICD-10-CM | POA: Insufficient documentation

## 2020-11-01 LAB — CBC WITH DIFFERENTIAL/PLATELET
Abs Immature Granulocytes: 0.07 10*3/uL (ref 0.00–0.07)
Basophils Absolute: 0.1 10*3/uL (ref 0.0–0.1)
Basophils Relative: 1 %
Eosinophils Absolute: 0.1 10*3/uL (ref 0.0–0.5)
Eosinophils Relative: 1 %
HCT: 52.2 % — ABNORMAL HIGH (ref 39.0–52.0)
Hemoglobin: 17.4 g/dL — ABNORMAL HIGH (ref 13.0–17.0)
Immature Granulocytes: 1 %
Lymphocytes Relative: 26 %
Lymphs Abs: 3.2 10*3/uL (ref 0.7–4.0)
MCH: 31.7 pg (ref 26.0–34.0)
MCHC: 33.3 g/dL (ref 30.0–36.0)
MCV: 95.1 fL (ref 80.0–100.0)
Monocytes Absolute: 0.9 10*3/uL (ref 0.1–1.0)
Monocytes Relative: 8 %
Neutro Abs: 8 10*3/uL — ABNORMAL HIGH (ref 1.7–7.7)
Neutrophils Relative %: 63 %
Platelets: 338 10*3/uL (ref 150–400)
RBC: 5.49 MIL/uL (ref 4.22–5.81)
RDW: 15.6 % — ABNORMAL HIGH (ref 11.5–15.5)
WBC: 12.4 10*3/uL — ABNORMAL HIGH (ref 4.0–10.5)
nRBC: 0 % (ref 0.0–0.2)

## 2020-11-01 LAB — CBG MONITORING, ED
Glucose-Capillary: 391 mg/dL — ABNORMAL HIGH (ref 70–99)
Glucose-Capillary: 318 mg/dL — ABNORMAL HIGH (ref 70–99)
Glucose-Capillary: 210 mg/dL — ABNORMAL HIGH (ref 70–99)

## 2020-11-01 LAB — BASIC METABOLIC PANEL
Anion gap: 9 (ref 5–15)
BUN: 21 mg/dL (ref 8–23)
CO2: 25 mmol/L (ref 22–32)
Calcium: 9.4 mg/dL (ref 8.9–10.3)
Chloride: 103 mmol/L (ref 98–111)
Creatinine, Ser: 1.09 mg/dL (ref 0.61–1.24)
GFR, Estimated: >60 mL/min (ref >60)
Glucose, Bld: 282 mg/dL — ABNORMAL HIGH (ref 70–99)
Potassium: 4.4 mmol/L (ref 3.5–5.1)
Sodium: 137 mmol/L (ref 135–145)

## 2020-11-01 MED ORDER — AEROCHAMBER Z-STAT PLUS/MEDIUM MISC
1.0000 | Freq: Once | Status: AC
  Administered 2020-11-01: 1
  Filled 2020-11-01: qty 1

## 2020-11-01 MED ORDER — SODIUM CHLORIDE 0.9 % IV SOLN: INTRAVENOUS | Status: DC

## 2020-11-01 MED ORDER — ALBUTEROL SULFATE HFA 108 (90 BASE) MCG/ACT IN AERS
2.0000 | INHALATION_SPRAY | Freq: Once | RESPIRATORY_TRACT | Status: AC
  Administered 2020-11-01: 2 via RESPIRATORY_TRACT
  Filled 2020-11-01: qty 6.7

## 2020-11-01 MED ORDER — INSULIN ASPART 100 UNIT/ML IJ SOLN
8.0000 [IU] | Freq: Once | INTRAMUSCULAR | Status: AC
  Administered 2020-11-01: 8 [IU] via INTRAVENOUS
  Filled 2020-11-01: qty 0.08

## 2020-11-01 MED ORDER — CEFAZOLIN SODIUM-DEXTROSE 1-4 GM/50ML-% IV SOLN
1.0000 g | Freq: Once | INTRAVENOUS | Status: AC
  Administered 2020-11-01: 1 g via INTRAVENOUS
  Filled 2020-11-01: qty 50

## 2020-11-01 MED ORDER — SODIUM CHLORIDE 0.9 % IV BOLUS
1000.0000 mL | Freq: Once | INTRAVENOUS | Status: AC
  Administered 2020-11-01: 1000 mL via INTRAVENOUS

## 2020-11-01 NOTE — ED Notes (Signed)
CBG: 391 mg/dL

## 2020-11-01 NOTE — ED Notes (Signed)
CBG: 210 mg/dL

## 2020-11-01 NOTE — ED Triage Notes (Signed)
Per EMS pt from home, feeling nausea and dizzy when standing. HX  COPD,diab CBG 403 normally not above 200. 138/58 Hr 100 RA 93 % 20 G RH 300n/s

## 2020-11-01 NOTE — ED Provider Notes (Signed)
[place] DEPT Provider Note   CSN: [id number] Arrival date & time: 11/01/20  1037     History No chief complaint on file.   Matthew [name] is a 75 y.o. male.  Pt presents to the ED today with elevated blood sugars and feeling nauseous and dizzy when standing.  The pt said his blood sugar normally stays in the 200s.  It was 403 today.  Pt denies any pain or fever.  He has been taking his meds as directed.      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/19/2020   Chronic intermittent hypoxia with obstructive sleep apnea 06/19/2020   Hypertensive emergency 12/07/2019   Hyperlipidemia 12/07/2019   Diabetes mellitus type II, uncontrolled ([place]) 12/07/2019   Cognitive decline 12/07/2019   COPD without exacerbation ([place]) 12/07/2019   CKD (chronic kidney disease), stage II 12/07/2019   Hypothyroidism 12/07/2019   Polycythemia secondary to smoking 12/07/2019   ICH (intracerebral hemorrhage) (HCC) - L parietal hypertensive hmg 12/05/2019   Encephalopathy acute 10/19/2018    Past Surgical History:  Procedure Laterality Date   THYROIDECTOMY         Family History  Problem Relation Age of Onset   Heart attack Father    Hypertension Father    Breast cancer Mother    Hypertension Mother    Hypertension Brother    Heart attack Brother    Heart attack Sister     Social History   Tobacco  Use   Smoking status: Every Day    Packs/day: 1.50    Pack years: 0.00    Types: Cigarettes   Smokeless tobacco: Never   Tobacco comments:    Counseling sheet given in exam room for smoking   Vaping Use   Vaping Use: Unknown  Substance Use Topics   Alcohol use: Not Currently   Drug use: No    Home Medications Prior to Admission medications   Medication Sig Start Date End Date Taking? Authorizing Provider  amLODipine (NORVASC) 5 MG tablet Take 1 tablet (5 mg total) by mouth daily. 12/08/19   Donzetta Starch, NP  atorvastatin (LIPITOR) 40 MG tablet Take 1 tablet (40 mg total) by mouth daily. 02/14/20   Frann Rider, NP  blood glucose meter kit and supplies Dispense based on patient and insurance preference. Use up to four times daily as directed. (FOR ICD-10 E10.9, E11.9). 10/24/18   Asencion Noble, MD  divalproex (DEPAKOTE) 125 MG DR tablet Take 125-250 mg by mouth at bedtime as needed (SLEEP MOOD AND MIGRAINE PREVENTION).  12/21/18   [provider]  glipiZIDE (GLUCOTROL XL) 10 MG 24 hr tablet Take 10 mg by mouth daily. 10/23/19   [provider]  JARDIANCE 10 MG TABS tablet Take 10 mg by mouth daily. 11/18/18   [provider]  levothyroxine (SYNTHROID) 200 MCG  tablet Take 300 mcg by mouth daily before breakfast. Take 1 tablet by mouth once daily then take 2 tablets on the same day each week for hypothyroid 09/01/18   [provider]  lisinopril (ZESTRIL) 40 MG tablet Take 1 tablet (40 mg total) by mouth daily. 10/24/18   Asencion Noble, MD  metFORMIN (GLUCOPHAGE) 1000 MG tablet Take 1 tablet (1,000 mg total) by mouth 2 (two) times daily with a meal. 12/08/19   Donzetta Starch, NP  SYMBICORT 160-4.5 MCG/ACT inhaler SMARTSIG:2 Puff(s) By Mouth Every 12 Hours 02/02/20   [provider]  triamterene-hydrochlorothiazide (MAXZIDE) 75-50 MG tablet Take by mouth. 07/17/20   [provider]    Allergies    Patient has no known  allergies.  Review of Systems   Review of Systems  Endocrine:       Elevated blood sugar  Neurological:  Positive for dizziness.  All other systems reviewed and are negative.  Physical Exam Updated Vital Signs BP (!) 153/61 (BP Location: Left Arm)   Pulse (!) 102   Temp 98.2 F (36.8 C) (Oral)   Resp 18   Ht 5' 11" (1.803 m)   Wt 99.8 kg   SpO2 95%   BMI 30.68 kg/m   Physical Exam Vitals and nursing note reviewed.  Constitutional:      Appearance: Normal appearance. He is obese.  HENT:     Head: Normocephalic and atraumatic.     Right Ear: External ear normal.     Left Ear: External ear normal.     Nose: Nose normal.     Mouth/Throat:     Mouth: Mucous membranes are moist.     Pharynx: Oropharynx is clear.  Eyes:     Extraocular Movements: Extraocular movements intact.     Conjunctiva/sclera: Conjunctivae normal.     Pupils: Pupils are equal, round, and reactive to light.  Cardiovascular:     Rate and Rhythm: Regular rhythm. Tachycardia present.     Pulses: Normal pulses.     Heart sounds: Normal heart sounds.  Pulmonary:     Effort: Pulmonary effort is normal.     Breath sounds: Wheezing present.  Abdominal:     General: Abdomen is flat. Bowel sounds are normal.     Palpations: Abdomen is soft.  Musculoskeletal:        General: Normal range of motion.     Cervical back: Normal range of motion and neck supple.  Skin:    Capillary Refill: Capillary refill takes less than 2 seconds.     Comments: LLE cellulitis  Neurological:     General: No focal deficit present.     Mental Status: He is alert and oriented to person, place, and time.  Psychiatric:        Mood and Affect: Mood normal.        Behavior: Behavior normal.        Thought Content: Thought content normal.        Judgment: Judgment normal.    ED Results / Procedures / Treatments   Labs (all labs ordered are listed, but only abnormal results are displayed) Labs Reviewed  BASIC METABOLIC PANEL  - Abnormal; Notable for the following components:      Result Value   Glucose, Bld 282 (*)    All other components within normal limits  CBC WITH DIFFERENTIAL/PLATELET - Abnormal; Notable for the following components:   WBC 12.4 (*)    Hemoglobin 17.4 (*)  HCT 52.2 (*)    RDW 15.6 (*)    Neutro Abs 8.0 (*)    All other components within normal limits  CBG MONITORING, ED - Abnormal; Notable for the following components:   Glucose-Capillary 391 (*)    All other components within normal limits  CBG MONITORING, ED - Abnormal; Notable for the following components:   Glucose-Capillary 318 (*)    All other components within normal limits  CULTURE, BLOOD (ROUTINE X 2)  CBC WITH DIFFERENTIAL/PLATELET  CBG MONITORING, ED    EKG None  Radiology No results found.  Procedures Procedures   Medications Ordered in ED Medications  sodium chloride 0.9 % bolus 1,000 mL (1,000 mLs Intravenous New Bag/Given 11/01/20 1127)    And  0.9 %  sodium chloride infusion ( Intravenous New Bag/Given 11/01/20 1129)  aerochamber Z-Stat Plus/medium 1 each (has no administration in time range)  ceFAZolin (ANCEF) IVPB 1 g/50 mL premix (1 g Intravenous New Bag/Given 11/01/20 1142)  insulin aspart (novoLOG) injection 8 Units (8 Units Intravenous Given 11/01/20 1114)  albuterol (VENTOLIN HFA) 108 (90 Base) MCG/ACT inhaler 2 puff (2 puffs Inhalation Given 11/01/20 1159)    ED Course  I have reviewed the triage vital signs and the nursing notes.  Pertinent labs & imaging results that were available during my care of the patient were reviewed by me and considered in my medical decision making (see chart for details).    MDM Rules/Calculators/A&P                          Pt was unaware that his left leg was red.  The pt is started on ancef.  He is given fluids and IV insulin to decrease his blood sugar.  Pt is feeling much better.  He wants to go home.  He is encouraged to try to stop or decrease smoking  (admits to 2 packs/day).  He is encouraged to eat a diabetic diet.  He knows to return if worse.  Final Clinical Impression(s) / ED Diagnoses Final diagnoses:  Cellulitis of left lower extremity  Hyperglycemia due to diabetes mellitus ([place])  Tobacco abuse    Rx / DC Orders ED Discharge Orders     None        Isla Pence, MD 11/01/20 1323

## 2020-11-06 LAB — CULTURE, BLOOD (ROUTINE X 2): Culture: NO GROWTH

## 2020-11-19 ENCOUNTER — Ambulatory Visit (INDEPENDENT_AMBULATORY_CARE_PROVIDER_SITE_OTHER): Payer: Medicare Other | Admitting: Podiatry

## 2020-11-19 DIAGNOSIS — Z5329 Procedure and treatment not carried out because of patient's decision for other reasons: Secondary | ICD-10-CM

## 2021-01-22 ENCOUNTER — Telehealth: Payer: Self-pay | Admitting: Neurology

## 2021-01-22 NOTE — Telephone Encounter (Signed)
Checked in with [employer] to see if the pt was ever set up and they state he was set up 11/02/2020 but pt refused to use the machine and they turned it back in.

## 2021-01-24 ENCOUNTER — Other Ambulatory Visit: Payer: Self-pay | Admitting: Adult Health

## 2021-01-28 ENCOUNTER — Other Ambulatory Visit: Payer: Self-pay

## 2021-09-20 ENCOUNTER — Telehealth: Payer: Self-pay | Admitting: Podiatry

## 2021-09-20 NOTE — Telephone Encounter (Signed)
Pt.'s daughter called in the set up an appt for Matthew [name]. His appt is scheduled for Tues., 5/23; she stated that his nails are very long and hard. She would like to soak his feet and wanted to know what to use in the water when soaking. Please advise.

## 2021-09-24 ENCOUNTER — Ambulatory Visit (INDEPENDENT_AMBULATORY_CARE_PROVIDER_SITE_OTHER): Payer: Medicare Other | Admitting: Podiatry

## 2021-09-24 ENCOUNTER — Encounter: Payer: Self-pay | Admitting: Podiatry

## 2021-09-24 DIAGNOSIS — B353 Tinea pedis: Secondary | ICD-10-CM | POA: Diagnosis not present

## 2021-09-24 DIAGNOSIS — E119 Type 2 diabetes mellitus without complications: Secondary | ICD-10-CM

## 2021-09-24 DIAGNOSIS — M79674 Pain in right toe(s): Secondary | ICD-10-CM | POA: Diagnosis not present

## 2021-09-24 DIAGNOSIS — M79675 Pain in left toe(s): Secondary | ICD-10-CM | POA: Diagnosis not present

## 2021-09-24 DIAGNOSIS — E1165 Type 2 diabetes mellitus with hyperglycemia: Secondary | ICD-10-CM

## 2021-09-24 DIAGNOSIS — Z85850 Personal history of malignant neoplasm of thyroid: Secondary | ICD-10-CM | POA: Insufficient documentation

## 2021-09-24 DIAGNOSIS — E890 Postprocedural hypothyroidism: Secondary | ICD-10-CM | POA: Insufficient documentation

## 2021-09-24 DIAGNOSIS — Z6834 Body mass index (BMI) 34.0-34.9, adult: Secondary | ICD-10-CM | POA: Insufficient documentation

## 2021-09-24 DIAGNOSIS — B351 Tinea unguium: Secondary | ICD-10-CM

## 2021-09-24 MED ORDER — KETOCONAZOLE 2 % EX CREA: TOPICAL_CREAM | CUTANEOUS | 0 refills | Status: DC

## 2021-09-24 NOTE — Patient Instructions (Signed)
Athlete's Foot Athlete's foot (tinea pedis) is a fungal infection of the skin on your feet. It often occurs on the skin that is between or underneath the toes. It can also occur on the soles of your feet. The infection can spread from person to person (is contagious). It can also spread when a person's bare feet come in contact with the fungus on shower floors or on items such as shoes. What are the causes? This condition is caused by a fungus that grows in warm, moist places. You can get athlete's foot by sharing shoes, shower stalls, towels, and wet floors with someone who is infected. Not washing your feet or changing your socks often enough can also lead to athlete's foot. What increases the risk? This condition is more likely to develop in: Men. People who have a weak body defense system (immune system). People who have diabetes. People who use public showers, such as at a gym. People who wear heavy-duty shoes, such as industrial or military shoes. Seasons with warm, humid weather. What are the signs or symptoms? Symptoms of this condition include: Itchy areas between your toes or on the soles of your feet. White, flaky, or scaly areas between your toes or on the soles of your feet. Very itchy small blisters between your toes or on the soles of your feet. Small cuts in your skin. These cuts can become infected. Thick or discolored toenails. How is this diagnosed? This condition may be diagnosed with a physical exam and a review of your medical history. Your health care provider may also take a skin or toenail sample to examine under a microscope. How is this treated? This condition is treated with antifungal medicines. These may be applied as powders, ointments, or creams. In severe cases, an oral antifungal medicine may be given. Follow these instructions at home: Medicines Apply or take over-the-counter and prescription medicines only as told by your health care provider. Apply your  antifungal medicine as told by your health care provider. Do not stop using the antifungal even if your condition improves. Foot care Do not scratch your feet. Keep your feet dry: Wear cotton or wool socks. Change your socks every day or if they become wet. Wear shoes that allow air to flow, such as sandals or canvas tennis shoes. Wash and dry your feet, including the area between your toes. Also, wash and dry your feet: Every day or as told by your health care provider. After exercising. General instructions Do not let others use towels, shoes, nail clippers, or other personal items that touch your feet. Protect your feet by wearing sandals in wet areas, such as locker rooms and shared showers. Keep all follow-up visits. This is important. If you have diabetes, keep your blood sugar under control. Contact a health care provider if: You have a fever. You have swelling, soreness, warmth, or redness in your foot. Your feet are not getting better with treatment. Your symptoms get worse. You have new symptoms. You have severe pain. Summary Athlete's foot (tinea pedis) is a fungal infection of the skin on your feet. It often occurs on skin that is between or underneath the toes. This condition is caused by a fungus that grows in warm, moist places. Symptoms include white, flaky, or scaly areas between your toes or on the soles of your feet. This condition is treated with antifungal medicines. Keep your feet clean. Always dry them thoroughly. This information is not intended to replace advice given to you by   your health care provider. Make sure you discuss any questions you have with your health care provider. Document Revised: 08/12/2020 Document Reviewed: 08/12/2020 Elsevier Patient Education  [place].   For extremely dry, cracked feet: moisturize feet once daily; do not apply between toes Vaseline Petroleum Healing Jelly   If you have problems reaching your feet: apply to  feet once daily; do not apply between toes A.  Eucerin Aquaphor Ointment Body Spray  B.  Vaseline Intensive Care Spray Moisturizer (Unscented,  [place] Radiant Spray or Aloe Smooth Spray)

## 2021-09-30 NOTE — Progress Notes (Signed)
ANNUAL DIABETIC FOOT EXAM  Subjective: DIMITRIOUS MICCICHE presents today for annual diabetic foot examination.  Patient relates >10 year h/o diabetes.  Patient denies any h/o foot wounds.   Patient admits symptoms of foot tingling on occasion.  Risk factors: diabetes, HTN, COPD, CKD, hyperlipidemia, current tobacco user.  Leonard Downing, MD is patient's PCP. Last visit was Sep 11, 2020.  His granddaughter is present during today's visit.  Past Medical History:  Diagnosis Date   Adenomatous polyps    Benign neoplasm of colon    Chronic kidney disease    stage 2   COPD (chronic obstructive pulmonary disease) (HCC)    Diabetes mellitus without complication ([place])    External hemorrhoids    Hemorrhoids    Thyroid cancer [facility])    Patient Active Problem List   Diagnosis Date Noted   Body mass index (BMI) 34.0-34.9, adult 09/24/2021   History of thyroidectomy 09/24/2021   Personal history of malignant neoplasm of thyroid 09/24/2021   Type 2 diabetes mellitus with hyperglycemia ([place]) 09/24/2021   Poor sleep hygiene 06/19/2020   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   Tobacco use 06/19/2020   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/19/2020   At risk for sleep apnea 06/19/2020   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   Chronic intermittent hypoxia with obstructive sleep apnea 06/19/2020   Hypertensive emergency 12/07/2019   Hyperlipidemia 12/07/2019   Diabetes mellitus type II, uncontrolled 12/07/2019   Cognitive decline 12/07/2019   COPD without exacerbation ([place]) 12/07/2019   CKD (chronic kidney disease), stage II 12/07/2019   Hypothyroidism 12/07/2019   Polycythemia secondary to smoking 12/07/2019   ICH (intracerebral hemorrhage) (HCC) - L parietal hypertensive hmg 12/05/2019   Encephalopathy acute 10/19/2018   Past Surgical History:  Procedure Laterality Date   THYROIDECTOMY     Current Outpatient Medications on File Prior to  Visit  Medication Sig Dispense Refill   Semaglutide,0.25 or 0.5MG /DOS, (OZEMPIC, 0.25 OR 0.5 MG/DOSE,) 2 MG/1.5ML SOPN See admin instructions.     atorvastatin (LIPITOR) 40 MG tablet TAKE 1 TABLET BY MOUTH  DAILY 90 tablet 3   blood glucose meter kit and supplies Dispense based on patient and insurance preference. Use up to four times daily as directed. (FOR ICD-10 E10.9, E11.9). 1 each 0   JARDIANCE 10 MG TABS tablet Take 10 mg by mouth daily.     levothyroxine (SYNTHROID) 200 MCG tablet Take 300 mcg by mouth daily before breakfast. Take 1 tablet by mouth once daily then take 2 tablets on the same day each week for hypothyroid     lisinopril (ZESTRIL) 40 MG tablet Take 1 tablet (40 mg total) by mouth daily. 30 tablet 0   metFORMIN (GLUCOPHAGE) 1000 MG tablet Take 1 tablet (1,000 mg total) by mouth 2 (two) times daily with a meal.     SYMBICORT 160-4.5 MCG/ACT inhaler SMARTSIG:2 Puff(s) By Mouth Every 12 Hours     triamterene-hydrochlorothiazide (MAXZIDE) 75-50 MG tablet Take by mouth.     No current facility-administered medications on file prior to visit.    No Known Allergies Social History   Occupational History    Comment: retired  Tobacco Use   Smoking status: Every Day    Packs/day: 1.50    Types: Cigarettes   Smokeless tobacco: Never   Tobacco comments:    Counseling sheet given in exam room for smoking   Vaping Use   Vaping Use: Unknown  Substance and Sexual Activity  Alcohol use: Not Currently   Drug use: No   Sexual activity: Not on file   Family History  Problem Relation Age of Onset   Heart attack Father    Hypertension Father    Breast cancer Mother    Hypertension Mother    Hypertension Brother    Heart attack Brother    Heart attack Sister     There is no immunization history on file for this patient.   Review of Systems: Negative except as noted in the HPI.   Objective: There were no vitals filed for this visit.  Teller Wakefield Peine is a pleasant 76 y.o.  male in NAD. AAO X 3.  Vascular Examination: CFT <3 seconds b/l LE. Palpable DP pulse(s) b/l LE. Faintly palpable PT pulse(s) b/l LE. Pedal hair absent. No pain with calf compression b/l. Lower extremity skin temperature gradient within normal limits. No edema noted b/l LE. No cyanosis or clubbing noted b/l LE.  Dermatological Examination: Pedal integument with normal turgor, texture and tone BLE. No open wounds b/l LE. No interdigital macerations noted b/l LE. Toenails 2-5 bilaterally elongated, discolored, dystrophic, thickened, and crumbly with subungual debris and tenderness to dorsal palpation. Toenail(s) 2-5 bilaterally elongated, discolored, dystrophic, thickened >1/4 inch. Nails are crumbly with subungual debris and there is exquisite tenderness to dorsal palpation. No subungual wound(s) noted. No hyperkeratotic nor porokeratotic lesions present on today's visit. Pedal skin noted to be dry b/l lower extremities. Diffuse scaling noted peripherally and plantarly b/l feet.  No interdigital macerations.  No blisters, no weeping. No signs of secondary bacterial infection noted.  Neurological Examination: Protective sensation intact 5/5 intact bilaterally with 10g monofilament b/l. Vibratory sensation intact b/l.  Musculoskeletal Examination: Normal muscle strength 5/5 to all lower extremity muscle groups bilaterally. No pain, crepitus or joint limitation noted with ROM b/l LE. No gross bony pedal deformities b/l. Patient ambulates independently without assistive aids.  Footwear Assessment: Does the patient wear appropriate shoes? Yes. Does the patient need inserts/orthotics? No.  Assessment: 1. Dermatophytosis of nail   2. Pain in toes of both feet   3. Tinea pedis of both feet   4. Uncontrolled type 2 diabetes mellitus with hyperglycemia ([place])   5. Encounter for diabetic foot exam ([place])     ADA Risk Categorization: Low Risk :  Patient has all of the following: Intact protective  sensation No prior foot ulcer  No severe deformity Pedal pulses present  Plan: -Patient was evaluated and treated. All patient's and/or POA's questions/concerns answered on today's visit. -Examined patient. -For dry skin, recommended Vaseline Petroleum Jelly be applied to feet once daily avoiding application between toes. -Diabetic foot examination performed today. -Continue foot and shoe inspections daily. Monitor blood glucose per PCP/Endocrinologist's recommendations. -Patient to continue soft, supportive shoe gear daily. -Mycotic toenails 1-5 bilaterally were debrided in length and girth with sterile nail nippers and dremel without incident. -For tinea pedis, Rx sent to pharmacy for Ketoconazole Cream 2% to be applied once daily for six weeks. -Patient/POA to call should there be question/concern in the interim. Return in about 3 months (around 12/25/2021).  Marzetta Board, DPM

## 2021-10-18 ENCOUNTER — Other Ambulatory Visit: Payer: Self-pay | Admitting: Adult Health

## 2021-12-30 ENCOUNTER — Ambulatory Visit: Payer: Medicare Other | Admitting: Podiatry

## 2022-03-21 ENCOUNTER — Telehealth: Payer: Self-pay | Admitting: Adult Health

## 2022-03-21 NOTE — Telephone Encounter (Addendum)
Pt's daughter, Matthew [name] (on [place]) memory loss (short term). Cannot remember short term, do not take a bath, few sponge baths, he refuses to do anything, having sexual behavior toward family members. Would like a earlier appt.  Have scheduled appt on 07/29/22. Informed Ms. Otte nurse will call if a earlier appt becomes available.

## 2022-03-21 NOTE — Telephone Encounter (Signed)
Called pt daughter. Schedule for 11/21 @ 8:45 am

## 2022-03-24 NOTE — Progress Notes (Unsigned)
[facility] [address] [place]. [place] [address] [phone]       STROKE FOLLOW UP NOTE  Mr. TALVIN CHRISTIANSON Date of Birth:  1945/10/06 Medical Record Number:  [id number]   Reason for visit: Worsening memory    SUBJECTIVE:   CHIEF COMPLAINT:  No chief complaint on file.   HPI:   Update 03/25/2022 JM: Patient returns per request for concerns of worsening memory.    He did receive CPAP machine back in 11/2020 but refused to use it and turned machine back in.    History provided for reference purposes only Update 09/11/2020 JM: Mr. Russman returns for 55-monthstroke follow-up accompanied by his daughter, AJanace Hoard  Doing well from a stroke standpoint without residual deficits or new stroke/TIA symptoms.  Previously reported headaches but these have since resolved.  Reports compliance on atorvastatin 40 mg daily without associated side effects.  Blood pressure today 125/72.  Monitors at home and has been stable. Continued tobacco use 1-1.5pack/day without plans on quitting.  He continues to live in his own home with his girlfriend and is able to maintain ADLs independently with family checking on him and assisting routinely. Per daughter, was seen by PCP Dr. EArelia Sneddonyesterday with lab work completed.  Underwent HST although despite limited recording time, dx'd with moderate sleep apnea with AHI 16.8/h and hypoxia with oxygen nadir 76% for greater than 50% of sleep time.  Due to patient's cognitive baseline, unable to complete repeat in lab sleep test therefore CPAP initiated.  Due to CPAP backorder, they are still awaiting to obtain CPAP machine.  No further concerns at this time  Initial visit 02/08/2020 JM: Mr. SKeysoris being seen for hospital follow-up accompanied by his daughter.  He has been stable since discharge and denies any residual deficits.  Denies new or reoccurring stroke/TIA symptoms.  He was released from home health PT/OT but unfortunately sedentary  with limited daytime activity or exercise.  Currently living with his girlfriend and is able to maintain ADLs independently but does get assistance with IADLs (baseline).  Does complain of right temporal pain in the same location as his migraine which has been more prominent since his stroke.  Daughter does report frequent napping and daytime fatigue during the day, difficulty sleeping at night and snoring.  Cognition has been stable per daughter without worsening post stroke.  Blood pressure today 140/81 routinely monitors at home which has been stable.  No further concerns.  Stroke admission 12/05/2019 Mr. CWeylyn RicciutiSGateris a 76y.o. male with history of diabetes and CKD who presented on 12/05/2019 with severe HA and confusion with SBP 200 on arrival.  Personally reviewed recent hospitalization pertinent progress notes, lab work and imaging with summary provided below.  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  On aspirin 81 mg PTA and discontinued given ICH.  Hypertensive emergency treated with Cleviprex stabilize during admission and restarted home meds lisinopril and added amlodipine 5 mg daily.  LDL UTC d/t TG 948 with direct LDL 75.5 and recommended initiating atorvastatin 40 mg daily at [place]  Uncontrolled DM with A1c 7.3 resuming home medications of glipizide, Metformin and Jardiance.  Other stroke risk factors include advanced age, tobacco use, hx of EtOH use, obesity and migraines but no prior stroke history.  Other active problems include cognitive decline with poor recall likely baseline dementia, COPD, CKD, hypothyroidism and polycythemia.  [facility] by therapy [facility]  Stroke:   L parietal ICH, hypertensive CT head L periatrial white matter IPH w/ mild edema. Chronic microhemorrhages.  MRI  Unchanged L parietal IPH CT head stable hemorrhage   CTA head & neck B ICA atherosclerosis. L ICA < 50% narrowing. Cervical  spondylosis. Aortic atherosclerosis. Emphysema.  2D Echo EF 60-65%. No source of embolus. LA mildly dilated LDL UTC d/t TG 948, direct LDL 75.5 HgbA1c 7.3 aspirin 81 mg daily prior to admission, now on No antithrombotic given hemorrhage  Therapy recommendations:  HH PT, HH OT Disposition:  return home      ROS:   14 system review of systems performed and negative with exception of those listed in HPI  PMH:  Past Medical History:  Diagnosis Date   Adenomatous polyps    Benign neoplasm of colon    Chronic kidney disease    stage 2   COPD (chronic obstructive pulmonary disease) ([place])    Diabetes mellitus without complication (HCC)    External hemorrhoids    Hemorrhoids    Thyroid cancer (HCC)     PSH:  Past Surgical History:  Procedure Laterality Date   THYROIDECTOMY      Social History:  Social History   Socioeconomic History   Marital status: Single    Spouse name: Not on file   Number of children: 4   Years of education: 8   Highest education level: Not on file  Occupational History    Comment: retired  Tobacco Use   Smoking status: Every Day    Packs/day: 1.50    Types: Cigarettes   Smokeless tobacco: Never   Tobacco comments:    Counseling sheet given in exam room for smoking   Vaping Use   Vaping Use: Unknown  Substance and Sexual Activity   Alcohol use: Not Currently   Drug use: No   Sexual activity: Not on file  Other Topics Concern   Not on file  Social History Narrative   Lives alone   Caffeine- coffee 6 cups, 1/2 Pepsi maybe   Social Determinants of Health   Financial Resource Strain: Not on file  Food Insecurity: Not on file  Transportation Needs: Not on file  Physical Activity: Not on file  Stress: Not on file  Social Connections: Not on file  Intimate Partner Violence: Not on file    Family History:  Family History  Problem Relation Age of Onset   Heart attack Father    Hypertension Father    Breast cancer Mother     Hypertension Mother    Hypertension Brother    Heart attack Brother    Heart attack Sister     Medications:   Current Outpatient Medications on File Prior to Visit  Medication Sig Dispense Refill   atorvastatin (LIPITOR) 40 MG tablet TAKE 1 TABLET BY MOUTH  DAILY 100 tablet 2   blood glucose meter kit and supplies Dispense based on patient and insurance preference. Use up to four times daily as directed. (FOR ICD-10 E10.9, E11.9). 1 each 0   JARDIANCE 10 MG TABS tablet Take 10 mg by mouth daily.     ketoconazole (NIZORAL) 2 % cream Apply to both feet and between toes once daily for 6 weeks. 60 g 0   levothyroxine (SYNTHROID) 200 MCG tablet Take 300 mcg by mouth daily before breakfast. Take 1 tablet by mouth once daily then take 2 tablets on the same day each week for hypothyroid     lisinopril (ZESTRIL) 40 MG tablet  Take 1 tablet (40 mg total) by mouth daily. 30 tablet 0   metFORMIN (GLUCOPHAGE) 1000 MG tablet Take 1 tablet (1,000 mg total) by mouth 2 (two) times daily with a meal.     Semaglutide,0.25 or 0.5MG/DOS, (OZEMPIC, 0.25 OR 0.5 MG/DOSE,) 2 MG/1.5ML SOPN See admin instructions.     SYMBICORT 160-4.5 MCG/ACT inhaler SMARTSIG:2 Puff(s) By Mouth Every 12 Hours     triamterene-hydrochlorothiazide (MAXZIDE) 75-50 MG tablet Take by mouth.     No current facility-administered medications on file prior to visit.    Allergies:  No Known Allergies    OBJECTIVE:  Physical Exam  There were no vitals filed for this visit.  There is no height or weight on file to calculate BMI. No results found.  General: Obese pleasant elderly Caucasian male, seated, in no evident distress Head: head normocephalic and atraumatic.   Neck: supple with no carotid or supraclavicular bruits Cardiovascular: regular rate and rhythm, no murmurs Musculoskeletal: no deformity Skin:  no rash/petichiae; BLE edema Vascular:  Normal pulses all extremities   Neurologic Exam Mental Status: Awake and fully  alert. fluent speech and language.  Disoriented to time and place. Recent and remote memory impaired. Attention span, concentration and fund of knowledge impaired with daughter providing majority of history. Mood and affect appropriate.  Cranial Nerves: Pupils equal, briskly reactive to light. Extraocular movements full without nystagmus. Visual fields full to confrontation. Hearing intact. Facial sensation intact. Face, tongue, palate moves normally and symmetrically.  Motor: Normal bulk and tone. Normal strength in all tested extremity muscles  Sensory.: intact to touch , pinprick , position and vibratory sensation.  Coordination: Rapid alternating movements normal in all extremities. Finger-to-nose and heel-to-shin performed accurately bilaterally. Gait and Station: Arises from chair without difficulty. Stance is normal. Gait demonstrates  broad-based gait with mild unsteadiness without use of assistive device Reflexes: 1+ and symmetric. Toes downgoing.         ASSESSMENT: KATRINA BROSH is a 76 y.o. year old male presented with severe headache, confusion and hypertensive emergency on 12/05/19 with stroke work-up revealing hypertensive left parietal ICH. Vascular risk factors include HTN, HLD, DM, current tobacco use, cognitive impairment, obesity, migraines and new diagnosis of OSA.      PLAN:  Memory loss  Left parietal ICH, hypertensive:  Recovered well without residual deficit Continue atorvastatin 40 mg daily for secondary stroke prevention.  No indication to restart in setting of recent [place] as no prior stroke history or extensive cardiac history Discussed secondary stroke prevention measures and importance of close PCP follow up for aggressive stroke risk factor management including BP goal<130/90, HLD with LDL goal<70 and DM with A1c.<7   Cognitive impairment: Stable Chronic migraines: Stable Tobacco use: Again discussed importance of complete tobacco cessation but unfortunately  he has no interest or desire in quitting at this time.  He verbalized understanding of increased risk with continued use Moderate sleep apnea: s/p HST 06/19/2020 with AHI 16.8/h and more than 50% sleep time and hypoxia although limited HST as recorded sleep time just over 2 hours.  Currently awaiting for CPAP machine (as currently on back order).  Advised daughter to call office once started for 60-90 day follow up visit per insurance purposes for initial compliance visit    CC:  [place] provider: Dr. Kristin Bruins, Curt Jews, MD     I spent *** minutes of face-to-face and non-face-to-face time with patient.  This included previsit chart review, lab review, study review, order entry, electronic  health record documentation, patient education   Frann Rider, [facility]  [facility] [address] [place], [place] [address]  Phone [phone] Fax [phone] Note: This document was prepared with digital dictation and possible smart phrase technology. Any transcriptional errors that result from this process are unintentional.

## 2022-03-25 ENCOUNTER — Encounter: Payer: Self-pay | Admitting: Adult Health

## 2022-03-25 ENCOUNTER — Telehealth: Payer: Self-pay | Admitting: Adult Health

## 2022-03-25 ENCOUNTER — Ambulatory Visit (INDEPENDENT_AMBULATORY_CARE_PROVIDER_SITE_OTHER): Payer: Medicare Other | Admitting: Adult Health

## 2022-03-25 VITALS — BP 134/64 | HR 98 | Ht 70.0 in | Wt 216.0 lb

## 2022-03-25 DIAGNOSIS — R4189 Other symptoms and signs involving cognitive functions and awareness: Secondary | ICD-10-CM | POA: Diagnosis not present

## 2022-03-25 DIAGNOSIS — Z8679 Personal history of other diseases of the circulatory system: Secondary | ICD-10-CM | POA: Diagnosis not present

## 2022-03-25 NOTE — Patient Instructions (Addendum)
Referral will be placed for neurocognitive evaluation - you will be called to schedule  You will be called to complete imaging of your brain - we will call you once we receive the results   Please let me know if you would like try medications to help slow your memory decline such as Aricept or Namenda  We will check blood work today - we will call you tomorrow with the results  Your untreated sleep apnea could be contributing to your memory loss, would highly recommend this be treated, if you would like to pursue this please let me know  Continued tobacco use is also likely contributing to your memory loss, would highly encourage complete tobacco cessation, can further discuss with Dr. Arelia Sneddon if assistance is needed  Doing routine memory exercises such as cross word puzzles, word search and sodoku can help slow memory decline. It is also important to ensure routine exercise, adequate sleep and healthy diet     Follow up will be determined after completion of neurocognitive testing

## 2022-03-25 NOTE — Telephone Encounter (Signed)
[employer] medicare NPR sent to GI [phone] 

## 2022-03-26 LAB — DEMENTIA PANEL
Homocysteine: 20.1 umol/L — ABNORMAL HIGH (ref 0.0–19.2)
RPR Ser Ql: NONREACTIVE
TSH: 1.41 u[IU]/mL (ref 0.450–4.500)
Vitamin B-12: 422 pg/mL (ref 232–1245)

## 2022-04-02 ENCOUNTER — Telehealth: Payer: Self-pay

## 2022-04-02 NOTE — Telephone Encounter (Signed)
Contacted pt, spoke to daughter per DPR, informed her recent lab work satisfactory, slightly increased homocysteine level likely clinically insignificant. No changes at this time. Advised to keep MRI appt and we will FU with them after those results come back. Number provided to call back with any questions or concerns.  She was appreciative.

## 2022-04-02 NOTE — Telephone Encounter (Signed)
-----   Message from Frann Rider, NP sent at 04/02/2022  7:33 AM EST ----- Please advise patient/daughter that recent lab work satisfactory, slightly increased homocysteine level likely clinically insignificant.  No changes at this time.

## 2022-04-10 ENCOUNTER — Other Ambulatory Visit: Payer: Medicare Other

## 2022-05-08 ENCOUNTER — Other Ambulatory Visit: Payer: Medicare Other

## 2022-07-23 ENCOUNTER — Other Ambulatory Visit: Payer: Self-pay | Admitting: Adult Health

## 2022-07-29 ENCOUNTER — Ambulatory Visit: Payer: Medicare Other | Admitting: Diagnostic Neuroimaging

## 2022-08-25 ENCOUNTER — Other Ambulatory Visit: Payer: Self-pay | Admitting: Adult Health

## 2022-12-03 ENCOUNTER — Other Ambulatory Visit: Payer: Self-pay | Admitting: Adult Health

## 2023-02-23 ENCOUNTER — Other Ambulatory Visit: Payer: Self-pay | Admitting: Adult Health

## 2023-04-20 ENCOUNTER — Other Ambulatory Visit: Payer: Self-pay | Admitting: Adult Health

## 2023-04-22 ENCOUNTER — Other Ambulatory Visit: Payer: Self-pay

## 2023-04-22 MED ORDER — ATORVASTATIN CALCIUM 40 MG PO TABS: 40.0000 mg | ORAL_TABLET | Freq: Every day | ORAL | 0 refills | Status: DC

## 2023-05-05 ENCOUNTER — Other Ambulatory Visit: Payer: Self-pay | Admitting: Adult Health

## 2023-06-06 ENCOUNTER — Other Ambulatory Visit: Payer: Self-pay | Admitting: Adult Health

## 2023-06-08 NOTE — Telephone Encounter (Signed)
 Last seen on 03/25/22 No follow up scheduled

## 2024-01-14 ENCOUNTER — Emergency Department (HOSPITAL_COMMUNITY)

## 2024-01-14 ENCOUNTER — Encounter (HOSPITAL_COMMUNITY): Payer: Self-pay

## 2024-01-14 ENCOUNTER — Emergency Department (HOSPITAL_COMMUNITY)
Admission: EM | Admit: 2024-01-14 | Discharge: 2024-01-14 | Disposition: A | Discharge status: Home / Self Care | Source: Home / Self Care | Attending: Emergency Medicine | Admitting: Emergency Medicine

## 2024-01-14 ENCOUNTER — Other Ambulatory Visit: Payer: Self-pay

## 2024-01-14 DIAGNOSIS — W19XXXA Unspecified fall, initial encounter: Secondary | ICD-10-CM

## 2024-01-14 DIAGNOSIS — W1839XA Other fall on same level, initial encounter: Secondary | ICD-10-CM | POA: Insufficient documentation

## 2024-01-14 DIAGNOSIS — S82002A Unspecified fracture of left patella, initial encounter for closed fracture: Secondary | ICD-10-CM | POA: Insufficient documentation

## 2024-01-14 DIAGNOSIS — S91202A Unspecified open wound of left great toe with damage to nail, initial encounter: Secondary | ICD-10-CM | POA: Insufficient documentation

## 2024-01-14 DIAGNOSIS — M542 Cervicalgia: Secondary | ICD-10-CM | POA: Insufficient documentation

## 2024-01-14 DIAGNOSIS — N189 Chronic kidney disease, unspecified: Secondary | ICD-10-CM | POA: Insufficient documentation

## 2024-01-14 DIAGNOSIS — Z85850 Personal history of malignant neoplasm of thyroid: Secondary | ICD-10-CM | POA: Insufficient documentation

## 2024-01-14 DIAGNOSIS — E1122 Type 2 diabetes mellitus with diabetic chronic kidney disease: Secondary | ICD-10-CM | POA: Insufficient documentation

## 2024-01-14 DIAGNOSIS — S82032A Displaced transverse fracture of left patella, initial encounter for closed fracture: Secondary | ICD-10-CM | POA: Diagnosis not present

## 2024-01-14 DIAGNOSIS — J449 Chronic obstructive pulmonary disease, unspecified: Secondary | ICD-10-CM | POA: Insufficient documentation

## 2024-01-14 DIAGNOSIS — Z8673 Personal history of transient ischemic attack (TIA), and cerebral infarction without residual deficits: Secondary | ICD-10-CM | POA: Insufficient documentation

## 2024-01-14 DIAGNOSIS — Y92009 Unspecified place in unspecified non-institutional (private) residence as the place of occurrence of the external cause: Secondary | ICD-10-CM | POA: Insufficient documentation

## 2024-01-14 DIAGNOSIS — S91209A Unspecified open wound of unspecified toe(s) with damage to nail, initial encounter: Secondary | ICD-10-CM

## 2024-01-14 DIAGNOSIS — Z7984 Long term (current) use of oral hypoglycemic drugs: Secondary | ICD-10-CM | POA: Insufficient documentation

## 2024-01-14 DIAGNOSIS — F0390 Unspecified dementia without behavioral disturbance: Secondary | ICD-10-CM | POA: Insufficient documentation

## 2024-01-14 MED ORDER — OXYCODONE-ACETAMINOPHEN 5-325 MG PO TABS
1.0000 | ORAL_TABLET | Freq: Once | ORAL | Status: AC
  Administered 2024-01-14: 1 via ORAL
  Filled 2024-01-14: qty 1

## 2024-01-14 MED ORDER — TRAMADOL HCL 50 MG PO TABS: 50.0000 mg | ORAL_TABLET | Freq: Four times a day (QID) | ORAL | 0 refills | Status: DC | PRN

## 2024-01-14 NOTE — ED Provider Notes (Signed)
 [place] EMERGENCY DEPARTMENT AT [facility] Provider Note   CSN: [id number] Arrival date & time: 01/14/24  0006     Patient presents with: Matthew [name]   Matthew [name] is a 78 y.o. male.   The history is provided by the EMS personnel. The history is limited by the condition of the patient (Dementia).  Fall   He has history of diabetes, chronic kidney disease, COPD, thyroid  cancer, stroke and comes in with report of a fall.  Patient does not remember anything.  He is complaining of pain in his neck and his left knee.    Prior to Admission medications   Medication Sig Start Date End Date Taking? Authorizing Provider  atorvastatin  (LIPITOR) 40 MG tablet TAKE 1 TABLET BY MOUTH DAILY 05/07/23   Whitfield Raisin, NP  blood glucose meter kit and supplies Dispense based on patient and insurance preference. Use up to four times daily as directed. (FOR ICD-10 E10.9, E11.9). 10/24/18   Arminda Daved HERO, MD  JARDIANCE 10 MG TABS tablet Take 10 mg by mouth daily. 11/18/18   [provider]  ketoconazole  (NIZORAL ) 2 % cream Apply to both feet and between toes once daily for 6 weeks. 09/24/21   Gaynel Delon CROME, DPM  levothyroxine  (SYNTHROID ) 200 MCG tablet Take 300 mcg by mouth daily before breakfast. Take 1 tablet by mouth once daily then take 2 tablets on the same day each week for hypothyroid 09/01/18   [provider]  lisinopril  (ZESTRIL ) 40 MG tablet Take 1 tablet (40 mg total) by mouth daily. 10/24/18   Arminda Daved HERO, MD  metFORMIN  (GLUCOPHAGE ) 1000 MG tablet Take 1 tablet (1,000 mg total) by mouth 2 (two) times daily with a meal. 12/08/19   Noemi Reena CROME, NP  Semaglutide,0.25 or 0.5MG /DOS, (OZEMPIC, 0.25 OR 0.5 MG/DOSE,) 2 MG/1.5ML SOPN See admin instructions. 11/27/20   [provider]  SYMBICORT 160-4.5 MCG/ACT inhaler SMARTSIG:2 Puff(s) By Mouth Every 12 Hours 02/02/20   [provider]  triamterene-hydrochlorothiazide (MAXZIDE) 75-50 MG tablet Take  by mouth. 07/17/20   [provider]    Allergies: Patient has no known allergies.    Review of Systems  Unable to perform ROS: Dementia    Updated Vital Signs BP (!) 175/84 (BP Location: Right Arm)   Pulse 94   Temp 97.8 F (36.6 C) (Oral)   Resp (!) 24   SpO2 92%   Physical Exam Vitals and nursing note reviewed.   78 year old male, resting comfortably and in no acute distress. Vital signs are significant for elevated blood pressure and mildly elevated respiratory rate. Oxygen  saturation is 92%, which is normal. Head is normocephalic and atraumatic. PERRLA, EOMI. Neck is nontender. Back is nontender. Lungs are clear without rales, wheezes, or rhonchi. Chest is nontender. Heart has regular rate and rhythm without murmur. Abdomen is soft, flat, nontender. Extremities: There is mild soft tissue swelling around the left knee, tenderness to palpation rather diffusely, pain with passive range of motion although full passive range of motion is present.  He is unable to extend his left knee actively.  There is avulsion of the left first toenail but no laceration and no swelling or deformity of the foot.  Skin tear present on the ulnar aspect of the left wrist but no swelling or deformity.  Full passive range of motion present all joints without pain except as noted above. Skin is warm and dry without rash. Neurologic: Awake and alert, oriented to person but  not place or time, cranial nerves are intact, moves all extremities equally.  Radiology: DG Foot 2 Views Left Result Date: 01/14/2024 CLINICAL DATA:  Fall EXAM: LEFT FOOT - 2 VIEW COMPARISON:  None Available. FINDINGS: No acute bony abnormality. Specifically, no fracture, subluxation, or dislocation. Plantar calcaneal spur. Soft tissues are intact. IMPRESSION: No acute bony abnormality. Electronically Signed   By: Franky Crease M.D.   On: 01/14/2024 00:56   DG Knee Complete 4 Views Left Result Date: 01/14/2024 CLINICAL DATA:   Fall, pain EXAM: LEFT KNEE - COMPLETE 4+ VIEW COMPARISON:  None Available. FINDINGS: There is a mid patellar fracture with mild displacement. Overlying soft tissue swelling. Small joint effusion. No subluxation or dislocation. IMPRESSION: Displaced patellar fracture.  Associated joint effusion. Electronically Signed   By: Franky Crease M.D.   On: 01/14/2024 00:55   CT Cervical Spine Wo Contrast Result Date: 01/14/2024 CLINICAL DATA:  Neck trauma (Age >= 65y).  Fall. EXAM: CT CERVICAL SPINE WITHOUT CONTRAST TECHNIQUE: Multidetector CT imaging of the cervical spine was performed without intravenous contrast. Multiplanar CT image reconstructions were also generated. RADIATION DOSE REDUCTION: This exam was performed according to the departmental dose-optimization program which includes automated exposure control, adjustment of the mA and/or kV according to patient size and/or use of iterative reconstruction technique. COMPARISON:  None Available. FINDINGS: Alignment: No subluxation. Skull base and vertebrae: No acute fracture. No primary bone lesion or focal pathologic process. Soft tissues and spinal canal: No prevertebral fluid or swelling. No visible canal hematoma. Disc levels: Disc space narrowing and anterior spurring at C4-5 and C5-6. Bilateral degenerative facet disease, left greater than right. Upper chest: No acute findings Other: None IMPRESSION: Degenerative disc and facet disease. No acute bony abnormality. Electronically Signed   By: Franky Crease M.D.   On: 01/14/2024 00:55   CT Head Wo Contrast Result Date: 01/14/2024 CLINICAL DATA:  Head trauma, minor (Age >= 65y).  Fall. EXAM: CT HEAD WITHOUT CONTRAST TECHNIQUE: Contiguous axial images were obtained from the base of the skull through the vertex without intravenous contrast. RADIATION DOSE REDUCTION: This exam was performed according to the departmental dose-optimization program which includes automated exposure control, adjustment of the mA and/or  kV according to patient size and/or use of iterative reconstruction technique. COMPARISON:  12/06/2019 FINDINGS: Brain: There is atrophy and chronic small vessel disease changes. No acute intracranial abnormality. Specifically, no hemorrhage, hydrocephalus, mass lesion, acute infarction, or significant intracranial injury. Old left basal ganglia lacunar infarct, stable. Vascular: No hyperdense vessel or unexpected calcification. Skull: No acute calvarial abnormality. Sinuses/Orbits: No acute findings Other: None IMPRESSION: Atrophy, chronic microvascular disease. No acute intracranial abnormality. Electronically Signed   By: Franky Crease M.D.   On: 01/14/2024 00:53     .Ortho Injury Treatment  Date/Time: 01/14/2024 2:38 AM  Performed by: Raford Lenis, MD Authorized by: Raford Lenis, MD   Consent:    Consent obtained:  Verbal   Consent given by:  Patient   Risks discussed:  Restricted joint movement   Alternatives discussed:  No treatmentInjury location: knee Location details: left knee Injury type: fracture Fracture type: patellar Pre-procedure neurovascular assessment: neurovascularly intact Pre-procedure distal perfusion: normal Pre-procedure neurological function: normal Pre-procedure range of motion: reduced  Anesthesia: Local anesthesia used: no  Patient sedated: NoManipulation performed: no Immobilization: Matthew (Knee immobilizer) Splint Applied by: Kemp Balm Supplies used: Knee immobilizer. Post-procedure distal perfusion: normal Post-procedure neurological function: normal Post-procedure range of motion: unchanged      Medications Ordered in the  ED  oxyCODONE -acetaminophen  (PERCOCET/ROXICET) 5-325 MG per tablet 1 tablet (1 tablet Oral Given 01/14/24 0132)                                    Medical Decision Making Amount and/or Complexity of Data Reviewed Radiology: ordered.  Risk Prescription drug management.   Fall with injury to left knee and left foot and  skin tear of left wrist.  Complain of neck pain without tenderness but in the setting of fall and dementia, will need to evaluate for possible occult fracture.  I have ordered x-rays of left foot and knee, CT of head and cervical spine.  I reviewed his past records and note neurology office visit on 03/25/2022 for memory loss and cognitive decline.  CT scans showed no acute intracranial or cervical spine injury.  X-rays of the foot show no fracture, x-rays of the knee show a fracture of the patella.  I have independently viewed all of the images, and agree with the radiologist's interpretation.  I have ordered an knee immobilizer. Patient's family has arrived and apparently he lives alone and someone comes in to check on him once or twice a day.  I did discuss possible referral to a rehab facility while he is recovering from this, but patient states he does not want to do that and wants to go home.  I have requested transitions of care consult to see if he needs any home health services or equipment.  I am referring him to orthopedics for follow-up.  I have ordered a prescription for tramadol  for pain.  I have recommended he use antibiotic ointment over his nailbed and to return if any signs of infection develop.     Final diagnoses:  Fall at home, initial encounter  Closed displaced fracture of left patella, unspecified fracture morphology, initial encounter  Avulsion of toenail of left foot    ED Discharge Orders          Ordered    traMADol  (ULTRAM ) 50 MG tablet  Every 6 hours PRN        01/14/24 0236               Raford Lenis, MD 01/14/24 [phone]

## 2024-01-14 NOTE — Discharge Instructions (Addendum)
 You have a broken kneecap.  You need to wear the knee immobilizer at all times until told by the orthopedic doctor that you can remove it.  Do not try to stand on that leg.  You need to see the orthopedic doctor soon as possible.  You may need to obtain a wheelchair to him move around the house.  Apply ice for 30 minutes at a time, 4 times a day.  You may take acetaminophen /or ibuprofen as needed for pain.  I am giving you a prescription for tramadol  that you may use for pain not relieved by the combination of acetaminophen  and ibuprofen.  Your toenail was pulled off.  It will take 6 months for a new nail to grow back.  Please apply antibiotic ointment to the nailbed to try to prevent infection.  If you develop any redness or swelling there, you need to be seen immediately.

## 2024-01-14 NOTE — ED Notes (Signed)
Transported to radiology 

## 2024-01-14 NOTE — ED Notes (Signed)
Ortho tech at bedside to apply knee immobilizer.  

## 2024-01-14 NOTE — ED Notes (Signed)
 Ortho paged.

## 2024-01-14 NOTE — ED Notes (Signed)
 [facility] called for transport.

## 2024-01-14 NOTE — Progress Notes (Signed)
 Orthopedic Tech Progress Note Patient Details:  ED MANDICH 12/18/1945 [id number]  Patient was unable to tolerate extending the knee at close to or at 0 degrees due to swelling and levels of pain he was experiencing. There is flexion currently at the knee as I applied the knee immobilizer. He was instructed to remain in the brace until F/U with Ortho. Ortho Devices Type of Ortho Device: Knee Immobilizer Ortho Device/Splint Location: L P[place]LLA Ortho Device/Splint Interventions: Ordered, Application, Adjustment   Post Interventions Patient Tolerated: Poor Instructions Provided: Care of device   Delbra Zellars L Morganne Haile 01/14/2024, 4:07 AM

## 2024-01-14 NOTE — ED Triage Notes (Addendum)
 Pt is hospice pt BIB [facility] from home for fall. Pt lives alone usually A&O x4, was found supine on bathroom floor tonight. Pt states he can only remember eating breakfast. Unable to say when & why he fell. Pt c/o left knee pain, skin tear to left foot, left forearm. 20g RAC Cbg 172 BP 170/86 HR 90-100 97%-100% 3L [place] 50mcg Fentanyl  given by EMS for pain rated 10/10   At time of triage pt A&O x4. Rates pain 6/10 in left foot. Left great toe bleeding controlled at time of triage. SABRA

## 2024-01-15 ENCOUNTER — Telehealth: Payer: Self-pay | Admitting: Orthopedic Surgery

## 2024-01-15 NOTE — Telephone Encounter (Signed)
 Pt's daughter [name] called and states pt was seen at ED for fractured left patella and to see Georgina. Please call [name] at [phone].

## 2024-01-15 NOTE — Telephone Encounter (Signed)
 I called and advised pts daughter [name] to call Dr. Fidel at [facility] as he was the one on call for Jolynn Pack on this date. She has the number and will give them a call.

## 2024-01-16 ENCOUNTER — Other Ambulatory Visit: Payer: Self-pay

## 2024-01-16 ENCOUNTER — Inpatient Hospital Stay (HOSPITAL_COMMUNITY)
Admission: EM | Admit: 2024-01-16 | Discharge: 2024-01-20 | DRG: 562 | Disposition: A | Discharge status: Hospice | Attending: Internal Medicine | Admitting: Internal Medicine

## 2024-01-16 ENCOUNTER — Encounter (HOSPITAL_COMMUNITY): Payer: Self-pay | Admitting: Emergency Medicine

## 2024-01-16 ENCOUNTER — Emergency Department (HOSPITAL_COMMUNITY)

## 2024-01-16 DIAGNOSIS — K922 Gastrointestinal hemorrhage, unspecified: Secondary | ICD-10-CM | POA: Diagnosis not present

## 2024-01-16 DIAGNOSIS — K921 Melena: Secondary | ICD-10-CM | POA: Diagnosis present

## 2024-01-16 DIAGNOSIS — J9610 Chronic respiratory failure, unspecified whether with hypoxia or hypercapnia: Secondary | ICD-10-CM | POA: Diagnosis present

## 2024-01-16 DIAGNOSIS — S82002K Unspecified fracture of left patella, subsequent encounter for closed fracture with nonunion: Principal | ICD-10-CM

## 2024-01-16 DIAGNOSIS — I130 Hypertensive heart and chronic kidney disease with heart failure and stage 1 through stage 4 chronic kidney disease, or unspecified chronic kidney disease: Secondary | ICD-10-CM | POA: Diagnosis present

## 2024-01-16 DIAGNOSIS — R042 Hemoptysis: Secondary | ICD-10-CM | POA: Diagnosis present

## 2024-01-16 DIAGNOSIS — Z6833 Body mass index (BMI) 33.0-33.9, adult: Secondary | ICD-10-CM

## 2024-01-16 DIAGNOSIS — N182 Chronic kidney disease, stage 2 (mild): Secondary | ICD-10-CM | POA: Diagnosis present

## 2024-01-16 DIAGNOSIS — J449 Chronic obstructive pulmonary disease, unspecified: Secondary | ICD-10-CM | POA: Diagnosis present

## 2024-01-16 DIAGNOSIS — G4733 Obstructive sleep apnea (adult) (pediatric): Secondary | ICD-10-CM | POA: Diagnosis present

## 2024-01-16 DIAGNOSIS — R296 Repeated falls: Secondary | ICD-10-CM | POA: Diagnosis present

## 2024-01-16 DIAGNOSIS — R413 Other amnesia: Secondary | ICD-10-CM | POA: Diagnosis present

## 2024-01-16 DIAGNOSIS — E890 Postprocedural hypothyroidism: Secondary | ICD-10-CM | POA: Diagnosis present

## 2024-01-16 DIAGNOSIS — R531 Weakness: Secondary | ICD-10-CM | POA: Diagnosis not present

## 2024-01-16 DIAGNOSIS — Z7189 Other specified counseling: Secondary | ICD-10-CM

## 2024-01-16 DIAGNOSIS — Z9981 Dependence on supplemental oxygen: Secondary | ICD-10-CM

## 2024-01-16 DIAGNOSIS — E669 Obesity, unspecified: Secondary | ICD-10-CM | POA: Diagnosis present

## 2024-01-16 DIAGNOSIS — E119 Type 2 diabetes mellitus without complications: Secondary | ICD-10-CM

## 2024-01-16 DIAGNOSIS — I5032 Chronic diastolic (congestive) heart failure: Secondary | ICD-10-CM | POA: Diagnosis present

## 2024-01-16 DIAGNOSIS — R918 Other nonspecific abnormal finding of lung field: Secondary | ICD-10-CM | POA: Diagnosis present

## 2024-01-16 DIAGNOSIS — J4489 Other specified chronic obstructive pulmonary disease: Secondary | ICD-10-CM | POA: Diagnosis present

## 2024-01-16 DIAGNOSIS — S43005A Unspecified dislocation of left shoulder joint, initial encounter: Secondary | ICD-10-CM | POA: Diagnosis present

## 2024-01-16 DIAGNOSIS — Y92002 Bathroom of unspecified non-institutional (private) residence single-family (private) house as the place of occurrence of the external cause: Secondary | ICD-10-CM | POA: Diagnosis not present

## 2024-01-16 DIAGNOSIS — E1122 Type 2 diabetes mellitus with diabetic chronic kidney disease: Secondary | ICD-10-CM | POA: Diagnosis present

## 2024-01-16 DIAGNOSIS — Z803 Family history of malignant neoplasm of breast: Secondary | ICD-10-CM

## 2024-01-16 DIAGNOSIS — Z79899 Other long term (current) drug therapy: Secondary | ICD-10-CM

## 2024-01-16 DIAGNOSIS — F17210 Nicotine dependence, cigarettes, uncomplicated: Secondary | ICD-10-CM | POA: Diagnosis present

## 2024-01-16 DIAGNOSIS — Z7984 Long term (current) use of oral hypoglycemic drugs: Secondary | ICD-10-CM

## 2024-01-16 DIAGNOSIS — Z800 Family history of malignant neoplasm of digestive organs: Secondary | ICD-10-CM | POA: Diagnosis not present

## 2024-01-16 DIAGNOSIS — Z79890 Hormone replacement therapy (postmenopausal): Secondary | ICD-10-CM

## 2024-01-16 DIAGNOSIS — Z515 Encounter for palliative care: Secondary | ICD-10-CM

## 2024-01-16 DIAGNOSIS — D649 Anemia, unspecified: Secondary | ICD-10-CM

## 2024-01-16 DIAGNOSIS — J9621 Acute and chronic respiratory failure with hypoxia: Secondary | ICD-10-CM | POA: Diagnosis not present

## 2024-01-16 DIAGNOSIS — S91202A Unspecified open wound of left great toe with damage to nail, initial encounter: Secondary | ICD-10-CM | POA: Diagnosis present

## 2024-01-16 DIAGNOSIS — R0602 Shortness of breath: Secondary | ICD-10-CM

## 2024-01-16 DIAGNOSIS — S82032A Displaced transverse fracture of left patella, initial encounter for closed fracture: Secondary | ICD-10-CM | POA: Diagnosis present

## 2024-01-16 DIAGNOSIS — W19XXXA Unspecified fall, initial encounter: Secondary | ICD-10-CM | POA: Diagnosis present

## 2024-01-16 DIAGNOSIS — Z85850 Personal history of malignant neoplasm of thyroid: Secondary | ICD-10-CM

## 2024-01-16 DIAGNOSIS — S82009A Unspecified fracture of unspecified patella, initial encounter for closed fracture: Secondary | ICD-10-CM

## 2024-01-16 DIAGNOSIS — Z7951 Long term (current) use of inhaled steroids: Secondary | ICD-10-CM

## 2024-01-16 DIAGNOSIS — I10 Essential (primary) hypertension: Secondary | ICD-10-CM | POA: Diagnosis not present

## 2024-01-16 DIAGNOSIS — Z8673 Personal history of transient ischemic attack (TIA), and cerebral infarction without residual deficits: Secondary | ICD-10-CM

## 2024-01-16 DIAGNOSIS — R52 Pain, unspecified: Secondary | ICD-10-CM | POA: Diagnosis not present

## 2024-01-16 DIAGNOSIS — D500 Iron deficiency anemia secondary to blood loss (chronic): Secondary | ICD-10-CM | POA: Diagnosis not present

## 2024-01-16 DIAGNOSIS — D631 Anemia in chronic kidney disease: Secondary | ICD-10-CM | POA: Diagnosis present

## 2024-01-16 DIAGNOSIS — Z720 Tobacco use: Secondary | ICD-10-CM | POA: Diagnosis not present

## 2024-01-16 DIAGNOSIS — Z860100 Personal history of colon polyps, unspecified: Secondary | ICD-10-CM

## 2024-01-16 DIAGNOSIS — E039 Hypothyroidism, unspecified: Secondary | ICD-10-CM | POA: Diagnosis present

## 2024-01-16 DIAGNOSIS — Z7985 Long-term (current) use of injectable non-insulin antidiabetic drugs: Secondary | ICD-10-CM

## 2024-01-16 DIAGNOSIS — Z66 Do not resuscitate: Secondary | ICD-10-CM | POA: Diagnosis present

## 2024-01-16 DIAGNOSIS — Z604 Social exclusion and rejection: Secondary | ICD-10-CM | POA: Diagnosis present

## 2024-01-16 DIAGNOSIS — Z91199 Patient's noncompliance with other medical treatment and regimen due to unspecified reason: Secondary | ICD-10-CM

## 2024-01-16 DIAGNOSIS — E785 Hyperlipidemia, unspecified: Secondary | ICD-10-CM | POA: Diagnosis present

## 2024-01-16 DIAGNOSIS — Z8249 Family history of ischemic heart disease and other diseases of the circulatory system: Secondary | ICD-10-CM

## 2024-01-16 DIAGNOSIS — S82002D Unspecified fracture of left patella, subsequent encounter for closed fracture with routine healing: Secondary | ICD-10-CM | POA: Diagnosis not present

## 2024-01-16 LAB — URINALYSIS, ROUTINE W REFLEX MICROSCOPIC
Bacteria, UA: NONE SEEN
Bilirubin Urine: NEGATIVE
Glucose, UA: NEGATIVE mg/dL
Hgb urine dipstick: NEGATIVE
Ketones, ur: NEGATIVE mg/dL
Leukocytes,Ua: NEGATIVE
Nitrite: NEGATIVE
Protein, ur: 100 mg/dL — AB
Specific Gravity, Urine: 1.015 (ref 1.005–1.030)
pH: 5 (ref 5.0–8.0)

## 2024-01-16 LAB — CBC WITH DIFFERENTIAL/PLATELET
Abs Immature Granulocytes: 0.07 K/uL (ref 0.00–0.07)
Basophils Absolute: 0.1 K/uL (ref 0.0–0.1)
Basophils Relative: 1 %
Eosinophils Absolute: 0.1 K/uL (ref 0.0–0.5)
Eosinophils Relative: 1 %
HCT: 31.6 % — ABNORMAL LOW (ref 39.0–52.0)
Hemoglobin: 9.5 g/dL — ABNORMAL LOW (ref 13.0–17.0)
Immature Granulocytes: 1 %
Lymphocytes Relative: 10 %
Lymphs Abs: 1 K/uL (ref 0.7–4.0)
MCH: 28.5 pg (ref 26.0–34.0)
MCHC: 30.1 g/dL (ref 30.0–36.0)
MCV: 94.9 fL (ref 80.0–100.0)
Monocytes Absolute: 0.8 K/uL (ref 0.1–1.0)
Monocytes Relative: 7 %
Neutro Abs: 8.8 K/uL — ABNORMAL HIGH (ref 1.7–7.7)
Neutrophils Relative %: 80 %
Platelets: 471 K/uL — ABNORMAL HIGH (ref 150–400)
RBC: 3.33 MIL/uL — ABNORMAL LOW (ref 4.22–5.81)
RDW: 16.7 % — ABNORMAL HIGH (ref 11.5–15.5)
WBC: 10.9 K/uL — ABNORMAL HIGH (ref 4.0–10.5)
nRBC: 0 % (ref 0.0–0.2)

## 2024-01-16 LAB — IRON AND TIBC
Iron: 20 ug/dL — ABNORMAL LOW (ref 45–182)
Saturation Ratios: 7 % — ABNORMAL LOW (ref 17.9–39.5)
TIBC: 302 ug/dL (ref 250–450)
UIBC: 283 ug/dL

## 2024-01-16 LAB — COMPREHENSIVE METABOLIC PANEL WITH GFR
ALT: 16 U/L (ref 0–44)
AST: 25 U/L (ref 15–41)
Albumin: 3.1 g/dL — ABNORMAL LOW (ref 3.5–5.0)
Alkaline Phosphatase: 110 U/L (ref 38–126)
Anion gap: 13 (ref 5–15)
BUN: 34 mg/dL — ABNORMAL HIGH (ref 8–23)
CO2: 34 mmol/L — ABNORMAL HIGH (ref 22–32)
Calcium: 7.5 mg/dL — ABNORMAL LOW (ref 8.9–10.3)
Chloride: 91 mmol/L — ABNORMAL LOW (ref 98–111)
Creatinine, Ser: 1.51 mg/dL — ABNORMAL HIGH (ref 0.61–1.24)
GFR, Estimated: 47 mL/min — ABNORMAL LOW (ref >60)
Glucose, Bld: 138 mg/dL — ABNORMAL HIGH (ref 70–99)
Potassium: 3.5 mmol/L (ref 3.5–5.1)
Sodium: 137 mmol/L (ref 135–145)
Total Bilirubin: 0.2 mg/dL (ref 0.0–1.2)
Total Protein: 6.5 g/dL (ref 6.5–8.1)

## 2024-01-16 LAB — PROTIME-INR
INR: 0.9 (ref 0.8–1.2)
Prothrombin Time: 13 s (ref 11.4–15.2)

## 2024-01-16 LAB — CBC
HCT: 31 % — ABNORMAL LOW (ref 39.0–52.0)
Hemoglobin: 9.2 g/dL — ABNORMAL LOW (ref 13.0–17.0)
MCH: 28.5 pg (ref 26.0–34.0)
MCHC: 29.7 g/dL — ABNORMAL LOW (ref 30.0–36.0)
MCV: 96 fL (ref 80.0–100.0)
Platelets: 484 K/uL — ABNORMAL HIGH (ref 150–400)
RBC: 3.23 MIL/uL — ABNORMAL LOW (ref 4.22–5.81)
RDW: 16.5 % — ABNORMAL HIGH (ref 11.5–15.5)
WBC: 11.7 K/uL — ABNORMAL HIGH (ref 4.0–10.5)
nRBC: 0 % (ref 0.0–0.2)

## 2024-01-16 LAB — CBG MONITORING, ED: Glucose-Capillary: 133 mg/dL — ABNORMAL HIGH (ref 70–99)

## 2024-01-16 LAB — VITAMIN B12: Vitamin B-12: 312 pg/mL (ref 180–914)

## 2024-01-16 LAB — TYPE AND SCREEN
ABO/RH(D): O POS
Antibody Screen: NEGATIVE

## 2024-01-16 LAB — CK: Total CK: 245 U/L (ref 49–397)

## 2024-01-16 LAB — ABO/RH: ABO/RH(D): O POS

## 2024-01-16 LAB — HEMOGLOBIN A1C
Hgb A1c MFr Bld: 5.8 % — ABNORMAL HIGH (ref 4.8–5.6)
Mean Plasma Glucose: 119.76 mg/dL

## 2024-01-16 LAB — POC OCCULT BLOOD, ED: Fecal Occult Bld: POSITIVE — AB

## 2024-01-16 LAB — TSH: TSH: 6.35 u[IU]/mL — ABNORMAL HIGH (ref 0.350–4.500)

## 2024-01-16 LAB — GLUCOSE, CAPILLARY: Glucose-Capillary: 167 mg/dL — ABNORMAL HIGH (ref 70–99)

## 2024-01-16 MED ORDER — TRAMADOL HCL 50 MG PO TABS
50.0000 mg | ORAL_TABLET | Freq: Two times a day (BID) | ORAL | Status: DC
  Administered 2024-01-16 – 2024-01-17 (×2): 50 mg via ORAL
  Filled 2024-01-16 (×2): qty 1

## 2024-01-16 MED ORDER — ACETAMINOPHEN 650 MG RE SUPP: 650.0000 mg | Freq: Four times a day (QID) | RECTAL | Status: DC | PRN

## 2024-01-16 MED ORDER — NICOTINE 14 MG/24HR TD PT24
14.0000 mg | MEDICATED_PATCH | Freq: Once | TRANSDERMAL | Status: AC
  Administered 2024-01-16: 14 mg via TRANSDERMAL
  Filled 2024-01-16: qty 1

## 2024-01-16 MED ORDER — SODIUM CHLORIDE 0.9 % IV SOLN: INTRAVENOUS | Status: AC

## 2024-01-16 MED ORDER — PANTOPRAZOLE SODIUM 40 MG IV SOLR
40.0000 mg | Freq: Two times a day (BID) | INTRAVENOUS | Status: DC
  Administered 2024-01-16 – 2024-01-19 (×6): 40 mg via INTRAVENOUS
  Filled 2024-01-16 (×6): qty 10

## 2024-01-16 MED ORDER — MORPHINE SULFATE (PF) 4 MG/ML IV SOLN
3.0000 mg | INTRAVENOUS | Status: DC | PRN
  Administered 2024-01-17 (×4): 3 mg via INTRAVENOUS
  Filled 2024-01-16 (×5): qty 1

## 2024-01-16 MED ORDER — ONDANSETRON HCL 4 MG/2ML IJ SOLN: 4.0000 mg | Freq: Four times a day (QID) | INTRAMUSCULAR | Status: DC | PRN

## 2024-01-16 MED ORDER — ACETAMINOPHEN 325 MG PO TABS
650.0000 mg | ORAL_TABLET | Freq: Four times a day (QID) | ORAL | Status: DC | PRN
  Administered 2024-01-17 – 2024-01-19 (×4): 650 mg via ORAL
  Filled 2024-01-16 (×4): qty 2

## 2024-01-16 MED ORDER — ONDANSETRON HCL 4 MG PO TABS: 4.0000 mg | ORAL_TABLET | Freq: Four times a day (QID) | ORAL | Status: DC | PRN

## 2024-01-16 MED ORDER — LEVOTHYROXINE SODIUM 100 MCG PO TABS
200.0000 ug | ORAL_TABLET | Freq: Every day | ORAL | Status: DC
  Administered 2024-01-17 – 2024-01-20 (×4): 200 ug via ORAL
  Filled 2024-01-16 (×4): qty 2

## 2024-01-16 MED ORDER — FLUTICASONE FUROATE-VILANTEROL 200-25 MCG/ACT IN AEPB
1.0000 | INHALATION_SPRAY | Freq: Every day | RESPIRATORY_TRACT | Status: DC
  Administered 2024-01-17 – 2024-01-19 (×3): 1 via RESPIRATORY_TRACT
  Filled 2024-01-16: qty 28

## 2024-01-16 MED ORDER — HYDRALAZINE HCL 20 MG/ML IJ SOLN: 5.0000 mg | Freq: Three times a day (TID) | INTRAMUSCULAR | Status: DC | PRN

## 2024-01-16 MED ORDER — IPRATROPIUM-ALBUTEROL 0.5-2.5 (3) MG/3ML IN SOLN
3.0000 mL | Freq: Four times a day (QID) | RESPIRATORY_TRACT | Status: DC | PRN
  Administered 2024-01-17 – 2024-01-20 (×4): 3 mL via RESPIRATORY_TRACT
  Filled 2024-01-16 (×3): qty 3

## 2024-01-16 MED ORDER — INSULIN ASPART 100 UNIT/ML IJ SOLN
0.0000 [IU] | Freq: Three times a day (TID) | INTRAMUSCULAR | Status: DC
  Administered 2024-01-16: 1 [IU] via SUBCUTANEOUS
  Administered 2024-01-17 (×2): 2 [IU] via SUBCUTANEOUS
  Filled 2024-01-16: qty 0.09

## 2024-01-16 NOTE — Assessment & Plan Note (Signed)
-  check TSH/free T4 -continue synthroid

## 2024-01-16 NOTE — Assessment & Plan Note (Addendum)
 New lung mass, need chest CT with contrast to further investigate Very gentle IVF overnight and hopefully creatinine improved for CT tomorrow  Hx of hemoptysis in smoker, concerning for cancer  He is on hospice and needs GOC conversation with family/palliative care to figure out steps forward for this

## 2024-01-16 NOTE — Assessment & Plan Note (Addendum)
 Echo 2021, normal EF with grade 1 DD  Does not appear volume overloaded  On lasix 40mg  BID and metolazone 2.5mg  twice a week  Hold lasix/lisinopril  for now  Strict I/O

## 2024-01-16 NOTE — Assessment & Plan Note (Addendum)
 No recent labs, unsure of baseline (creatinine normal in 04/2022)  Acute vs. Chronic?  Presenting creatinine of 1.5  UA with proteinuria Very judicial, time limited IVF  Hold nephrotoxin drugs for now  Strict I/O Trend

## 2024-01-16 NOTE — Assessment & Plan Note (Addendum)
+  fecal occult in setting of anemia (although unsure of baseline) new from 3 years ago Endorses hemotysis in setting of new lung mass seen on imaging and daughter reports black appearing stool  No NSAID use per daughter  No active bleed, BUN at 34. Unsure of baseline Start protonix  BID Clear liquid diet  Cbc q 6 hours Type and screen and transfuse if hgb <7. He consented to transfusions and discussed with daughter  Patient is hospice patient and will need GOC meeting with family to discuss plan. Palliative care consulted.  Will hold on GI consult until this is done

## 2024-01-16 NOTE — Assessment & Plan Note (Addendum)
-  on hospice for end stage COPD  -at baseline home oxygen  -no s/sx of flare  -continues to smoke  -restart symbicort -continue duoneb, SABA PRN  -continue morphine  PRN  -refuses cpap at night

## 2024-01-16 NOTE — Progress Notes (Signed)
 Orthopedic Tech Progress Note Patient Details:  Matthew [name] [birth year] [id number]  Ortho Devices Type of Ortho Device: Knee Immobilizer Ortho Device/Splint Location: LLE Ortho Device/Splint Interventions: Ordered, Application, Adjustment   Post Interventions Patient Tolerated: Matthew [name] 01/16/2024, 1:59 PM

## 2024-01-16 NOTE — Assessment & Plan Note (Addendum)
-  check CK with falls -no longer on statin likely due to hospice status

## 2024-01-16 NOTE — Assessment & Plan Note (Addendum)
 No recent labs anywhere to figure out baseline Fecal occult positive Family endorses hemoptysis and black stools  Check iron studies, B12 Trend CBC q 6 hours Type and screen  Transfuse to keep hgb >7

## 2024-01-16 NOTE — Assessment & Plan Note (Signed)
 No desire to stop Nicotine  patch

## 2024-01-16 NOTE — ED Notes (Signed)
 EKG completed and handed to Dr Dasie

## 2024-01-16 NOTE — Assessment & Plan Note (Signed)
Refuses cpap at night.

## 2024-01-16 NOTE — Assessment & Plan Note (Signed)
 Hold lisinopril  for now, unsure of baseline creatinine Hold lasix  Trend bmp PRN hydralazine  for pressures

## 2024-01-16 NOTE — H&P (Signed)
 History and Physical    Patient: Matthew [name] DOB: 1946-03-31 DOA: 01/16/2024 DOS: the patient was seen and examined on 01/16/2024 PCP: [name], MD  Patient coming from: Home - lives home alone. Family takes care of him. States he ambulates independently; however, he has not been able to walk since fall with patellar fracture    Chief Complaint: fall and inability to bear weight   HPI: Matthew [name] is a 78 y.o. male with medical history significant of T2DM, HTN, HLD, COPD, CKD stage 2, hx of thyroid  cancer, OSA not on cpap, hx of ICH due to HTN, chronic respiratory failure on chronic oxygen  who presented to ED after he fell 2 days and couldn't get around at home. He fell in his bathroom and he crawled to his living room and sat there and then called his daughter. He had a fractured patella and he was sent home. He was told he needed to see a Careers adviser.  He can't recall why he is here or tell me much history. Daughter tells me he went home 2 days ago and he took his brace off right when he got home. They have tried to take care of him, but he is unable to bear weight.  He called his daughter today and told her he needed to have a BM, but she couldn't move him. A friend tried to move him but the friend fell and he fell on top of this person and his knees hit the floor and he was screaming and so 911 was called. He has been eating and drinking and has some days he eats well and some days he does not eat well.   She also noticed some bright red blood in his sputum this week and her sister saw blood in the toilet. He also has been having diarrhea that is dark black. She is not sure how long it's been black, but she noticed it 2 weeks ago.   Denies any fever/chills, vision changes/headaches, chest pain or palpitations, shortness of breath or cough, abdominal pain, N/V/D, dysuria or leg swelling.    He smokes 2 PPD and denies any alcohol .   ER Course:  vitals: afebrile, bp:  132/62, HR: 92, RR: 22, oxygen : 100% 4L [place] Pertinent labs: hgb: 9.5, platelets 471, WBC: 10.9, BUN: 34, creatinine: 1.51, calcium : 7.5, albumin 3.1, fecal occult positive  CXR:  New masslike density projecting over the lingula measuring 9.6 x 7.7 cm. Malignancy such as lung cancer is not excluded and chest CT (with contrast if feasible) is recommended for further characterization. 2. Airway thickening is present, suggesting bronchitis or reactive airways disease. 3. Mild enlargement of the cardiopericardial silhouette. 4. Aortic Atherosclerosis (ICD10-I70.0) and Emphysema (ICD10-J43.9). CT head without contrast: no acute findings. CT cervical spine: no acute traumatic injury identified in cervical spine, chronic cervical spine degeneration and generalized pulmonary septal thickening in the lung apices, possibly chronic interstitial lung disease.   Pelvis xray: limited assessment due to body habitus. No overt coritcal discontinuity characteristic of fx identified.  Femur: Severely suboptimal cross-table lateral view of the proximal femur related to patient mobility and body habitus issues. Left knee xray: Stable transverse patellar fracture and knee effusion. Prepatellar soft tissue swelling.  In ED: PT consulted, SW consulted and TRH asked to admit.    Review of Systems: As mentioned in the history of present illness. All other systems reviewed and are negative. Past Medical History:  Diagnosis Date   Adenomatous polyps  Benign neoplasm of colon    Chronic kidney disease    stage 2   COPD (chronic obstructive pulmonary disease) (HCC)    Diabetes mellitus without complication (HCC)    External hemorrhoids    Hemorrhoids    Thyroid  cancer [facility])    Past Surgical History:  Procedure Laterality Date   THYROIDECTOMY     Social History:  reports that he has been smoking cigarettes. He has never used smokeless tobacco. He reports that he does not currently use alcohol . He reports  that he does not use drugs.  No Known Allergies  Family History  Problem Relation Age of Onset   Heart attack Father    Hypertension Father    Breast cancer Mother    Hypertension Mother    Hypertension Brother    Heart attack Brother    Heart attack Sister     Prior to Admission medications   Medication Sig Start Date End Date Taking? Authorizing Provider  acetaminophen  (TYLENOL ) 500 MG tablet Take 1,000 mg by mouth every 4 (four) hours as needed for mild pain (pain score 1-3) or moderate pain (pain score 4-6).   Yes [provider]  albuterol  (VENTOLIN  HFA) 108 (90 Base) MCG/ACT inhaler Inhale 2 puffs into the lungs every 4 (four) hours as needed for wheezing or shortness of breath. 08/19/23  Yes [provider]  furosemide (LASIX) 40 MG tablet Take 40 mg by mouth 2 (two) times daily. 01/01/24  Yes [provider]  ipratropium-albuterol  (DUONEB) 0.5-2.5 (3) MG/3ML SOLN Take 3 mLs by nebulization every 6 (six) hours as needed (wheezing/sob). 01/02/24  Yes [provider]  levothyroxine  (SYNTHROID ) 200 MCG tablet Take 200 mcg by mouth daily before breakfast. 09/01/18  Yes [provider]  lisinopril  (ZESTRIL ) 40 MG tablet Take 1 tablet (40 mg total) by mouth daily. Patient taking differently: Take 20 mg by mouth every evening. 10/24/18  Yes Arminda Daved HERO, MD  metFORMIN  (GLUCOPHAGE -XR) 750 MG 24 hr tablet Take 750 mg by mouth in the morning and at bedtime.   Yes [provider]  metolazone (ZAROXOLYN) 2.5 MG tablet Take 2.5 mg by mouth 2 (two) times a week. Take 2.5 mg by mouth twice a day on Monday and Thursday 12/25/23  Yes [provider]  Morphine  Sulfate (MORPHINE  CONCENTRATE) 10 mg / 0.5 ml concentrated solution TAKE 5 MG (5MG =0.25ML) BY MOUTH EVERY 4 HOURS AS NEEDED 12/17/23  Yes [provider]  traMADol  (ULTRAM ) 50 MG tablet Take 1 tablet (50 mg total) by mouth every 6 (six) hours as needed. Patient taking  differently: Take 50 mg by mouth in the morning and at bedtime. 01/14/24  Yes Raford Lenis, MD  atorvastatin  (LIPITOR) 40 MG tablet TAKE 1 TABLET BY MOUTH DAILY Patient not taking: Reported on 01/16/2024 05/07/23   Whitfield Raisin, NP  JARDIANCE 10 MG TABS tablet Take 10 mg by mouth daily. Patient not taking: Reported on 01/16/2024 11/18/18   [provider]  Semaglutide,0.25 or 0.5MG /DOS, (OZEMPIC, 0.25 OR 0.5 MG/DOSE,) 2 MG/1.5ML SOPN See admin instructions. Patient not taking: Reported on 01/16/2024 11/27/20   [provider]  SYMBICORT 160-4.5 MCG/ACT inhaler SMARTSIG:2 Puff(s) By Mouth Every 12 Hours Patient not taking: Reported on 01/16/2024 02/02/20   [provider]    Physical Exam: Vitals:   01/16/24 1530 01/16/24 1634 01/16/24 1730 01/16/24 1857  BP: (!) 158/79  (!) 153/69 (!) 141/61  Pulse: 94  93 (!) 101  Resp: (!) 22  (!) 23 (!)  22  Temp:  98.5 F (36.9 C)    TempSrc:  Oral    SpO2: 97%  96% 92%  Weight:      Height:       General:  Appears calm and comfortable and is in NAD. Obese, disheveled.  Eyes:  PERRL, EOMI, normal lids, iris ENT:  grossly normal hearing, lips & tongue, mmm; appropriate dentition Neck:  no LAD, masses or thyromegaly; no carotid bruits Cardiovascular:  RRR, no m/r/g. No LE edema.  Respiratory:   occasional expiratory wheeze in upper lobes, decreased air movement in bases. No rales/crackles.  Abdomen:  soft, NT, ND, NABS Back:   normal alignment, no CVAT Skin:  no rash or induration seen on limited exam Musculoskeletal:  grossly normal tone BUE/BLE, good ROM, no bony abnormality Lower extremity:  No LE edema.  Limited foot exam with no ulcerations.  2+ distal pulses. Psychiatric:  grossly normal mood and affect, speech fluent and appropriate, AOx2 Neurologic:  CN 2-12 grossly intact, moves all extremities in coordinated fashion, sensation intact   Radiological Exams on Admission: Independently reviewed - see discussion in A/P  where applicable  CT Cervical Spine Wo Contrast Result Date: 01/16/2024 CLINICAL DATA:  78 year old male with recurrent falls. EXAM: CT CERVICAL SPINE WITHOUT CONTRAST TECHNIQUE: Multidetector CT imaging of the cervical spine was performed without intravenous contrast. Multiplanar CT image reconstructions were also generated. RADIATION DOSE REDUCTION: This exam was performed according to the departmental dose-optimization program which includes automated exposure control, adjustment of the mA and/or kV according to patient size and/or use of iterative reconstruction technique. COMPARISON:  Head CT today.  Cervical spine CT 01/14/2024. FINDINGS: Alignment: Stable reversal of cervical lordosis. Cervicothoracic junction alignment is within normal limits. Bilateral posterior element alignment is within normal limits. Skull base and vertebrae: Visualized skull base is intact. No atlanto-occipital dissociation. C1 and C2 appear intact and aligned. No acute osseous abnormality identified. Soft tissues and spinal canal: Calcified cervical carotid atherosclerosis. Otherwise negative visible noncontrast neck soft tissues. Disc levels: Chronic cervical spine degeneration, with developing degenerative facet ankylosis on the left at C3-C4. Chronic disc and endplate degeneration maximal at C5-C6. Upper chest: Visible upper thoracic levels appear intact. Generalized pulmonary septal thickening in the lung apices appears stable, nonspecific. And somewhat similar to 2021 neck CTA appearance. IMPRESSION: 1. No acute traumatic injury identified in the cervical spine. 2. Chronic cervical spine degeneration. 3. 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 Electronically Signed   By: VEAR Hurst M.D.   On: 01/16/2024 12:14   CT Head Wo Contrast Result Date: 01/16/2024 CLINICAL DATA:  79 year old male with recurrent falls. EXAM: CT HEAD WITHOUT CONTRAST TECHNIQUE: Contiguous  axial images were obtained from the base of the skull through the vertex without intravenous contrast. RADIATION DOSE REDUCTION: This exam was performed according to the departmental dose-optimization program which includes automated exposure control, adjustment of the mA and/or kV according to patient size and/or use of iterative reconstruction technique. COMPARISON:  Brain MRI 12/05/2019.  Head CT 01/14/2024. FINDINGS: Brain: No midline shift, ventriculomegaly, mass effect, evidence of mass lesion, intracranial hemorrhage or evidence of cortically based acute infarction. Confluent bilateral cerebral white matter hypodensity is stable. Vascular: Calcified atherosclerosis at the skull base. No suspicious intracranial vascular hyperdensity. Skull: Stable, no acute fracture identified. Nonspecific multiple small but circumscribed lucent areas at the skull vertex, but these appears stable since at least 2021 and benign. Sinuses/Orbits: Visualized paranasal sinuses and mastoids are stable  and well aerated. Other: No acute orbit or scalp soft tissue injury identified. IMPRESSION: 1. No acute intracranial abnormality or acute traumatic injury identified. 2. Stable chronic white matter disease. Electronically Signed   By: VEAR Hurst M.D.   On: 01/16/2024 12:11   DG Knee Complete 4 Views Left Result Date: 01/16/2024 CLINICAL DATA:  Patellar fracture EXAM: LEFT KNEE - COMPLETE 4+ VIEW COMPARISON:  01/14/2024 FINDINGS: Stable transverse patellar fracture and knee effusion. Prepatellar soft tissue swelling. No new fracture identified. IMPRESSION: 1. Stable transverse patellar fracture and knee effusion. Prepatellar soft tissue swelling. Electronically Signed   By: Ryan Salvage M.D.   On: 01/16/2024 12:07   DG Femur Min 2 Views Left Result Date: 01/16/2024 CLINICAL DATA:  Fall, hip and knee pain EXAM: LEFT FEMUR 2 VIEWS COMPARISON:  None Available. FINDINGS: Atheromatous vascular calcifications. Knee effusion observed  with a transverse patellar fracture. Cross-table lateral view of the proximal femur severely suboptimal related to patient mobility and body habitus issues. IMPRESSION: 1. Transverse patellar fracture with knee effusion. 2. Atheromatous vascular calcifications. 3. Severely suboptimal cross-table lateral view of the proximal femur related to patient mobility and body habitus issues. Electronically Signed   By: Ryan Salvage M.D.   On: 01/16/2024 12:02   DG Pelvis Portable Result Date: 01/16/2024 CLINICAL DATA:  Fall, left hip pain EXAM: PORTABLE PELVIS 1-2 VIEWS COMPARISON:  None Available. FINDINGS: Limited assessment due to patient body habitus and difficulty positioning related to mobility and pain. In particular, there is poor definition of portions of the left iliac bone and left hemipelvis. The pelvis is mildly rotated to the left. No overt cortical discontinuity characteristic fracture is identified. IMPRESSION: 1. Limited assessment due to patient body habitus and difficulty positioning related to mobility and pain. In particular, there is poor definition of portions of the left iliac bone and left hemipelvis. No overt cortical discontinuity characteristic of fracture is identified. Electronically Signed   By: Ryan Salvage M.D.   On: 01/16/2024 12:00   DG Chest Portable 1 View Result Date: 01/16/2024 CLINICAL DATA:  Fall during transport to bedside commode. History of patellar fracture due to a prior fall. EXAM: PORTABLE CHEST 1 VIEW COMPARISON:  12/05/2019 FINDINGS: New masslike density projecting over the lingula measuring proximally 9.6 by 7.7 cm. Malignancy is not excluded and chest CT (with contrast if feasible) is recommended for further characterization. Emphysema noted. Mild enlargement of the cardiopericardial silhouette. Aortic atherosclerosis noted. Airway thickening is present, suggesting bronchitis or reactive airways disease. IMPRESSION: 1. New masslike density projecting over the  lingula measuring 9.6 x 7.7 cm. Malignancy such as lung cancer is not excluded and chest CT (with contrast if feasible) is recommended for further characterization. 2. Airway thickening is present, suggesting bronchitis or reactive airways disease. 3. Mild enlargement of the cardiopericardial silhouette. 4. Aortic Atherosclerosis (ICD10-I70.0) and Emphysema (ICD10-J43.9). Electronically Signed   By: Ryan Salvage M.D.   On: 01/16/2024 11:59    EKG: Independently reviewed.  NSR with rate 89; nonspecific ST changes with no evidence of acute ischemia. RBBB and LAFB. Artifact throughout.    Labs on Admission: I have personally reviewed the available labs and imaging studies at the time of the admission.  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 [place]   Controlled type 2 diabetes mellitus without complication, without long-term current use of insulin  (HCC)   Essential hypertension   Chronic diastolic CHF (congestive heart failure) (HCC)   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   Hyperlipidemia   Memory loss   OSA (obstructive sleep apnea)   Tobacco use    Assessment and Plan: * left transverse patellar fracture 78 year old male presenting to ED after falling and suffering a left displaced patellar fracture on 9/11 who fell again at home onto his knees and has been unable to bear weight or do ADLs so family called EMS to bring back to hospital  -admit to progressive -repeat films show known displaced left patellar fracture with effusion -knee brace placed back on patient, would not wear at home  -Consulted and discussed with Dr. Beverley, they  will see tomorrow  -will need to figure out GOC with palliative and ortho  -continue pain management  -fall risk    Upper GI bleed +fecal occult in setting of anemia (although unsure of baseline) new from 3 years ago Endorses hemotysis in setting of new lung mass seen on imaging and daughter reports black appearing stool  No NSAID use per daughter  No active bleed, BUN at 34. Unsure of baseline Start protonix  BID Clear liquid diet  Cbc q 6 hours Type and screen and transfuse if hgb <7. He consented to transfusions and discussed with daughter  Patient is hospice patient and will need GOC meeting with family to discuss plan. Palliative care consulted.  Will hold on GI consult until this is done  Normocytic anemia No recent labs anywhere to figure out baseline Fecal occult positive Family endorses hemoptysis and black stools  Check iron studies, B12 Trend CBC q 6 hours Type and screen  Transfuse to keep hgb >7   Lung mass New lung mass, need chest CT with contrast to further investigate Very gentle IVF overnight and hopefully creatinine improved for CT tomorrow  Hx of hemoptysis in smoker, concerning for cancer  He is on hospice and needs GOC conversation with family/palliative care to figure out steps forward for this   CKD (chronic kidney disease), stage II No recent labs, unsure of baseline (creatinine normal in 04/2022)  Acute vs. Chronic?  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 [place] -on hospice for end stage COPD  -at baseline home oxygen  -no s/sx of flare  -continues to smoke  -restart symbicort -continue duoneb, SABA PRN  -continue morphine  PRN  -refuses cpap at night   Controlled type 2 diabetes mellitus without complication, without long-term current use of insulin  (HCC) Check A1C, none in records SSI and accucheck qac/hs   Hold metformin    Essential hypertension Hold lisinopril  for now, unsure of baseline creatinine Hold lasix  Trend bmp PRN hydralazine  for pressures   Chronic diastolic CHF (congestive heart failure) (HCC) Echo 2021, normal EF with grade 1 DD  Does not appear volume overloaded  On lasix 40mg  BID and metolazone 2.5mg  twice a week  Hold lasix/lisinopril  for now  Strict I/O   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 Beverley, SW, PT   DVT Prophylaxis: SCDs   Family Communication: discussed with daughter [name] by phone.   Severity of Illness: The appropriate patient status for this patient is INPATIENT. Inpatient status is judged to be reasonable and necessary in order to provide the required intensity of service to ensure the patient's safety. The patient's presenting symptoms, physical exam findings, and initial radiographic and laboratory data in the context of their chronic comorbidities is felt to place them at high risk for further clinical deterioration. Furthermore, it is not anticipated that the patient will be medically stable for discharge from the hospital within 2 midnights of admission.   * I certify that at the point of admission it is my clinical judgment that the patient will require inpatient hospital care spanning beyond 2 midnights from the point of admission due to high intensity of service, high risk for  further deterioration and high frequency of surveillance required.*  Author: Isaiah Geralds, MD 01/16/2024 7:31 PM  For on call review [email or web address].

## 2024-01-16 NOTE — Assessment & Plan Note (Addendum)
 No official diagnosis of dementia per family, but clinically has significant short term memory loss  Delirium precautions Likely need SNF SW consulted

## 2024-01-16 NOTE — ED Triage Notes (Signed)
 Pt bib EMS from home. Pt fell during transport to bedside commode on his left knee that is broken from a previous fall. Denies LOC. Denies N/V/D. Pt not on thinners. Pt has surgery schedule 2 wks out.

## 2024-01-16 NOTE — Progress Notes (Signed)
 Matthew [name] WA17 [facility] Hospitalized Hospice Patient   This patient is?a current hospice patient with [facility], admitted with a terminal diagnosis of  Emphysema.  Patient is a DNR and Pennie Sizemore (dtr) [phone] is the patient's primary CG.   We will continue to follow for any discharge planning needs and to coordinate continuation of?hospice care.?   Please don't hesitate to call with any Hospice related questions or concerns.    Thank you for the opportunity to participate in this patient's care.   Transfer summary and Med list sent to be placed in chart.   When patient is ready for discharge please use [facility] as we contract this service for our patients.    Nat Babe, BSN, [facility] Liaison [phone]

## 2024-01-16 NOTE — Assessment & Plan Note (Signed)
 Check A1C, none in records SSI and accucheck qac/hs  Hold metformin 

## 2024-01-16 NOTE — ED Provider Notes (Addendum)
 [place] EMERGENCY DEPARTMENT AT [facility] Provider Note   CSN: [id number] Arrival date & time: 01/16/24  1016     Patient presents with: Matthew [name] is a 78 y.o. male with history of diabetes, CKD, COPD, stroke presents the emerged from today for evaluation after fall.  History is limited secondary to patient's dementia.  He is oriented x 2.  EMS reports the patient was seen for a witnessed fall with daughter.  Family should be on their way soon.  Reports that they were transferring from either the bed/chair to the bedside commode where the patient's legs gave out and he fell on his knee.  They deny any head injury, however patient is complaining of a frontal headache.  He reports left knee pain but denies any other chest, belly, or other extremity pain.  Reports some chronic back pain but unchanged.   Fall       Prior to Admission medications   Medication Sig Start Date End Date Taking? Authorizing Provider  atorvastatin  (LIPITOR) 40 MG tablet TAKE 1 TABLET BY MOUTH DAILY 05/07/23   Whitfield Raisin, NP  blood glucose meter kit and supplies Dispense based on patient and insurance preference. Use up to four times daily as directed. (FOR ICD-10 E10.9, E11.9). 10/24/18   Arminda Daved HERO, MD  JARDIANCE 10 MG TABS tablet Take 10 mg by mouth daily. 11/18/18   [provider]  ketoconazole  (NIZORAL ) 2 % cream Apply to both feet and between toes once daily for 6 weeks. 09/24/21   Galaway, Jennifer L, DPM  levothyroxine  (SYNTHROID ) 200 MCG tablet Take 300 mcg by mouth daily before breakfast. Take 1 tablet by mouth once daily then take 2 tablets on the same day each week for hypothyroid 09/01/18   [provider]  lisinopril  (ZESTRIL ) 40 MG tablet Take 1 tablet (40 mg total) by mouth daily. 10/24/18   Arminda Daved HERO, MD  metFORMIN  (GLUCOPHAGE ) 1000 MG tablet Take 1 tablet (1,000 mg total) by mouth 2 (two) times daily with a meal. 12/08/19   Noemi Reena CROME, NP   Semaglutide,0.25 or 0.5MG /DOS, (OZEMPIC, 0.25 OR 0.5 MG/DOSE,) 2 MG/1.5ML SOPN See admin instructions. 11/27/20   [provider]  SYMBICORT 160-4.5 MCG/ACT inhaler SMARTSIG:2 Puff(s) By Mouth Every 12 Hours 02/02/20   [provider]  traMADol  (ULTRAM ) 50 MG tablet Take 1 tablet (50 mg total) by mouth every 6 (six) hours as needed. 01/14/24   Raford Lenis, MD  triamterene-hydrochlorothiazide (MAXZIDE) 75-50 MG tablet Take by mouth. 07/17/20   [provider]    Allergies: Patient has no known allergies.    Review of Systems  Unable to perform ROS: Dementia    Updated Vital Signs BP 132/62 (BP Location: Left Arm)   Pulse 92   Temp 98.4 F (36.9 C) (Oral)   Resp (!) 22   Ht 5' 10 (1.778 m)   Wt 98.9 kg   SpO2 100%   BMI 31.28 kg/m   Physical Exam Vitals and nursing note reviewed.  Constitutional:      General: He is not in acute distress.    Appearance: He is not ill-appearing or toxic-appearing.  Eyes:     General: No scleral icterus. Cardiovascular:     Rate and Rhythm: Normal rate.  Pulmonary:     Effort: Pulmonary effort is normal. No respiratory distress.  Abdominal:     Palpations: Abdomen is soft.     Tenderness: There is no abdominal tenderness.  There is no guarding or rebound.  Musculoskeletal:     Comments: Bruising and swelling seen to the left knee.  Patient has some tenderness more to the anterior portion.  Compartments are soft.  Distal pulses intact.  No other trauma seen to the lower extremities.  Reports he does have some left hip pain however minor with pelvic rock.  No tenderness to the upper extremities.  Patient does have skin tear present to the left hand, small. Does not appear new. Nontender to palpation. Flexion and extension of the wrist without difficulty or pain.   Skin:    General: Skin is warm and dry.  Neurological:     Mental Status: He is alert.     Comments: Oriented to person and place, but reports it is November  2017.    (all labs ordered are listed, but only abnormal results are displayed) Labs Reviewed - No data to display  EKG: None  Radiology: CT Cervical Spine Wo Contrast Result Date: 01/16/2024 CLINICAL DATA:  78 year old male with recurrent falls. EXAM: CT CERVICAL SPINE WITHOUT CONTRAST TECHNIQUE: Multidetector CT imaging of the cervical spine was performed without intravenous contrast. Multiplanar CT image reconstructions were also generated. RADIATION DOSE REDUCTION: This exam was performed according to the departmental dose-optimization program which includes automated exposure control, adjustment of the mA and/or kV according to patient size and/or use of iterative reconstruction technique. COMPARISON:  Head CT today.  Cervical spine CT 01/14/2024. FINDINGS: Alignment: Stable reversal of cervical lordosis. Cervicothoracic junction alignment is within normal limits. Bilateral posterior element alignment is within normal limits. Skull base and vertebrae: Visualized skull base is intact. No atlanto-occipital dissociation. C1 and C2 appear intact and aligned. No acute osseous abnormality identified. Soft tissues and spinal canal: Calcified cervical carotid atherosclerosis. Otherwise negative visible noncontrast neck soft tissues. Disc levels: Chronic cervical spine degeneration, with developing degenerative facet ankylosis on the left at C3-C4. Chronic disc and endplate degeneration maximal at C5-C6. Upper chest: Visible upper thoracic levels appear intact. Generalized pulmonary septal thickening in the lung apices appears stable, nonspecific. And somewhat similar to 2021 neck CTA appearance. IMPRESSION: 1. No acute traumatic injury identified in the cervical spine. 2. Chronic cervical spine degeneration. 3. 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 Electronically Signed   By: VEAR Hurst M.D.   On: 01/16/2024 12:14   CT Head Wo Contrast Result  Date: 01/16/2024 CLINICAL DATA:  78 year old male with recurrent falls. EXAM: CT HEAD WITHOUT CONTRAST TECHNIQUE: Contiguous axial images were obtained from the base of the skull through the vertex without intravenous contrast. RADIATION DOSE REDUCTION: This exam was performed according to the departmental dose-optimization program which includes automated exposure control, adjustment of the mA and/or kV according to patient size and/or use of iterative reconstruction technique. COMPARISON:  Brain MRI 12/05/2019.  Head CT 01/14/2024. FINDINGS: Brain: No midline shift, ventriculomegaly, mass effect, evidence of mass lesion, intracranial hemorrhage or evidence of cortically based acute infarction. Confluent bilateral cerebral white matter hypodensity is stable. Vascular: Calcified atherosclerosis at the skull base. No suspicious intracranial vascular hyperdensity. Skull: Stable, no acute fracture identified. Nonspecific multiple small but circumscribed lucent areas at the skull vertex, but these appears stable since at least 2021 and benign. Sinuses/Orbits: Visualized paranasal sinuses and mastoids are stable and well aerated. Other: No acute orbit or scalp soft tissue injury identified. IMPRESSION: 1. No acute intracranial abnormality or acute traumatic injury identified. 2. Stable chronic white matter disease. Electronically Signed  By: VEAR Hurst M.D.   On: 01/16/2024 12:11   DG Knee Complete 4 Views Left Result Date: 01/16/2024 CLINICAL DATA:  Patellar fracture EXAM: LEFT KNEE - COMPLETE 4+ VIEW COMPARISON:  01/14/2024 FINDINGS: Stable transverse patellar fracture and knee effusion. Prepatellar soft tissue swelling. No new fracture identified. IMPRESSION: 1. Stable transverse patellar fracture and knee effusion. Prepatellar soft tissue swelling. Electronically Signed   By: Ryan Salvage M.D.   On: 01/16/2024 12:07   DG Femur Min 2 Views Left Result Date: 01/16/2024 CLINICAL DATA:  Fall, hip and knee pain  EXAM: LEFT FEMUR 2 VIEWS COMPARISON:  None Available. FINDINGS: Atheromatous vascular calcifications. Knee effusion observed with a transverse patellar fracture. Cross-table lateral view of the proximal femur severely suboptimal related to patient mobility and body habitus issues. IMPRESSION: 1. Transverse patellar fracture with knee effusion. 2. Atheromatous vascular calcifications. 3. Severely suboptimal cross-table lateral view of the proximal femur related to patient mobility and body habitus issues. Electronically Signed   By: Ryan Salvage M.D.   On: 01/16/2024 12:02   DG Pelvis Portable Result Date: 01/16/2024 CLINICAL DATA:  Fall, left hip pain EXAM: PORTABLE PELVIS 1-2 VIEWS COMPARISON:  None Available. FINDINGS: Limited assessment due to patient body habitus and difficulty positioning related to mobility and pain. In particular, there is poor definition of portions of the left iliac bone and left hemipelvis. The pelvis is mildly rotated to the left. No overt cortical discontinuity characteristic fracture is identified. IMPRESSION: 1. Limited assessment due to patient body habitus and difficulty positioning related to mobility and pain. In particular, there is poor definition of portions of the left iliac bone and left hemipelvis. No overt cortical discontinuity characteristic of fracture is identified. Electronically Signed   By: Ryan Salvage M.D.   On: 01/16/2024 12:00   DG Chest Portable 1 View Result Date: 01/16/2024 CLINICAL DATA:  Fall during transport to bedside commode. History of patellar fracture due to a prior fall. EXAM: PORTABLE CHEST 1 VIEW COMPARISON:  12/05/2019 FINDINGS: New masslike density projecting over the lingula measuring proximally 9.6 by 7.7 cm. Malignancy is not excluded and chest CT (with contrast if feasible) is recommended for further characterization. Emphysema noted. Mild enlargement of the cardiopericardial silhouette. Aortic atherosclerosis noted. Airway  thickening is present, suggesting bronchitis or reactive airways disease. IMPRESSION: 1. New masslike density projecting over the lingula measuring 9.6 x 7.7 cm. Malignancy such as lung cancer is not excluded and chest CT (with contrast if feasible) is recommended for further characterization. 2. Airway thickening is present, suggesting bronchitis or reactive airways disease. 3. Mild enlargement of the cardiopericardial silhouette. 4. Aortic Atherosclerosis (ICD10-I70.0) and Emphysema (ICD10-J43.9). Electronically Signed   By: Ryan Salvage M.D.   On: 01/16/2024 11:59    Procedures   Medications Ordered in the ED - No data to display  Clinical Course as of 01/17/24 1824  Sat Jan 16, 2024  1317 On phone with Shyrl with [facility]. Will have nursing staff call back. [RR]  1328 On phone with [facility], Kashay, she reports that he is a DNR/DNI. August 2nd 2024 has been on hospice. No CBC records. No records discussing cancer treatments.   [name] Cone, another daughter, [phone]. [RR]    Clinical Course User Index [RR] Bernis Ernst, PA-C                                 Medical Decision Making Amount and/or Complexity of  Data Reviewed Labs: ordered. Radiology: ordered.  Risk OTC drugs. Decision regarding hospitalization.   78 y.o. male presents to the ER for evaluation of fall. Differential diagnosis includes but is not limited to trauma, reinjury. Vital signs on his baseline oxygen  of 3 L.  Satting 100%.  Respirate Martis 22 however patient does not look tachypneic in the room.  Otherwise unremarkable.SABRA Physical exam as noted above.   On previous chart evaluation, patient was seen on 01-14-2024 for a fall and was diagnosed with a patella fracture.  He was put in the immobilizer.  However, patient was not wearing the knee immobilizer when he fell today.  Initially he was offered rehab with daughter however the patient did not want this to be done and he was sent home.  Patient  complaining of headache.  Head appears atraumatic.  Will order CT head given the members at bedside and patient demented at a precaution.  X-rays ordered as well.  I independently reviewed and interpreted the patient's labs.  CBC shows a white count of 10.9.  Hemoglobin at 9.5.  Appears the patient's always had polycythemia however most recent lab was 3 years prior.  I am unable to see any other recent lab work even with care everywhere.  Platelets at 471.  CMP chloride at 91, bicarb at 34 with a glucose of 138.  BUN at 34 with a creatinine of 1.51.  Calcium  is 10.5 and albumin 3.1.  No other electrolyte or LFT abnormality.  Urinalysis hazy with 100 protein otherwise unremarkable.  Fecal occult positive.  Images are per radiology read and listed as below:  CXR shows 1. New masslike density projecting over the lingula measuring 9.6 x 7.7 cm. Malignancy such as lung cancer is not excluded and chest CT (with contrast if feasible) is recommended for further characterization. 2. Airway thickening is present, suggesting bronchitis or reactive airways disease. 3. Mild enlargement of the cardiopericardial silhouette. 4. Aortic Atherosclerosis (ICD10-I70.0) and Emphysema   XR pelvis 1. Limited assessment due to patient body habitus and difficulty positioning related to mobility and pain. In particular, there is poor definition of portions of the left iliac bone and left hemipelvis. No overt cortical discontinuity characteristic of fracture is identified.   XR of left femur, knee  1. Transverse patellar fracture with knee effusion. 2. Atheromatous vascular calcifications. 3. Severely suboptimal cross-table lateral view of the proximal femur related to patient mobility and body habitus issues. 1. Stable transverse patellar fracture and knee effusion.   CT Head 1. No acute intracranial abnormality or acute traumatic injury identified. 2. Stable chronic white matter disease.  CT Cervical Spine  1. No acute traumatic  injury identified in the cervical spine. 2. Chronic cervical spine degeneration. 3. 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   I have had multiple conversations with family and other care representatives and hospital liaison.  I feel like there is some confusion with the members on hospice.  He does have a drop in his hemoglobin although they were 3 years prior.  His daughter reports that he has been having hemoptysis and hematochezia for the past few weeks.  They did not feel they can take care of him at home.  He does have a history of dementia.  He currently does not have a healthcare power of attorney but does have a financial power of attorney which is his daughter [name].  I did like the patient to benefit from hospice/palliative care consultation  for goals of care discussion.  Discussed this with daughter who is agreeable with plan. Admit to Dr. Waddell.   Portions of this report may have been transcribed using voice recognition software. Every effort was made to ensure accuracy; however, inadvertent computerized transcription errors may be present.    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

## 2024-01-16 NOTE — Assessment & Plan Note (Addendum)
 78 year old male presenting to ED after falling and suffering a left displaced patellar fracture on 9/11 who fell again at home onto his knees and has been unable to bear weight or do ADLs so family called EMS to bring back to hospital  -admit to progressive -repeat films show known displaced left patellar fracture with effusion -knee brace placed back on patient, would not wear at home  -Consulted and discussed with Dr. Beverley, they will see tomorrow  -will need to figure out GOC with palliative and ortho  -continue pain management  -fall risk

## 2024-01-17 ENCOUNTER — Inpatient Hospital Stay (HOSPITAL_COMMUNITY)

## 2024-01-17 DIAGNOSIS — E785 Hyperlipidemia, unspecified: Secondary | ICD-10-CM

## 2024-01-17 DIAGNOSIS — N182 Chronic kidney disease, stage 2 (mild): Secondary | ICD-10-CM

## 2024-01-17 DIAGNOSIS — K922 Gastrointestinal hemorrhage, unspecified: Secondary | ICD-10-CM

## 2024-01-17 DIAGNOSIS — Z66 Do not resuscitate: Secondary | ICD-10-CM | POA: Diagnosis not present

## 2024-01-17 DIAGNOSIS — E119 Type 2 diabetes mellitus without complications: Secondary | ICD-10-CM

## 2024-01-17 DIAGNOSIS — R413 Other amnesia: Secondary | ICD-10-CM

## 2024-01-17 DIAGNOSIS — J449 Chronic obstructive pulmonary disease, unspecified: Secondary | ICD-10-CM

## 2024-01-17 DIAGNOSIS — R0602 Shortness of breath: Secondary | ICD-10-CM

## 2024-01-17 DIAGNOSIS — Z7189 Other specified counseling: Secondary | ICD-10-CM

## 2024-01-17 DIAGNOSIS — R52 Pain, unspecified: Secondary | ICD-10-CM

## 2024-01-17 DIAGNOSIS — S82002D Unspecified fracture of left patella, subsequent encounter for closed fracture with routine healing: Secondary | ICD-10-CM

## 2024-01-17 DIAGNOSIS — I5032 Chronic diastolic (congestive) heart failure: Secondary | ICD-10-CM

## 2024-01-17 DIAGNOSIS — S82002K Unspecified fracture of left patella, subsequent encounter for closed fracture with nonunion: Secondary | ICD-10-CM | POA: Diagnosis not present

## 2024-01-17 DIAGNOSIS — E039 Hypothyroidism, unspecified: Secondary | ICD-10-CM

## 2024-01-17 DIAGNOSIS — R918 Other nonspecific abnormal finding of lung field: Secondary | ICD-10-CM

## 2024-01-17 DIAGNOSIS — Z720 Tobacco use: Secondary | ICD-10-CM

## 2024-01-17 DIAGNOSIS — Z79899 Other long term (current) drug therapy: Secondary | ICD-10-CM

## 2024-01-17 DIAGNOSIS — I10 Essential (primary) hypertension: Secondary | ICD-10-CM

## 2024-01-17 DIAGNOSIS — D649 Anemia, unspecified: Secondary | ICD-10-CM

## 2024-01-17 DIAGNOSIS — G4733 Obstructive sleep apnea (adult) (pediatric): Secondary | ICD-10-CM | POA: Diagnosis not present

## 2024-01-17 DIAGNOSIS — Z515 Encounter for palliative care: Secondary | ICD-10-CM

## 2024-01-17 LAB — BASIC METABOLIC PANEL WITH GFR
Anion gap: 15 (ref 5–15)
BUN: 29 mg/dL — ABNORMAL HIGH (ref 8–23)
CO2: 29 mmol/L (ref 22–32)
Calcium: 7.6 mg/dL — ABNORMAL LOW (ref 8.9–10.3)
Chloride: 90 mmol/L — ABNORMAL LOW (ref 98–111)
Creatinine, Ser: 1.21 mg/dL (ref 0.61–1.24)
GFR, Estimated: >60 mL/min (ref >60)
Glucose, Bld: 127 mg/dL — ABNORMAL HIGH (ref 70–99)
Potassium: 3.7 mmol/L (ref 3.5–5.1)
Sodium: 134 mmol/L — ABNORMAL LOW (ref 135–145)

## 2024-01-17 LAB — CBC
HCT: 29.5 % — ABNORMAL LOW (ref 39.0–52.0)
HCT: 30 % — ABNORMAL LOW (ref 39.0–52.0)
Hemoglobin: 8.9 g/dL — ABNORMAL LOW (ref 13.0–17.0)
Hemoglobin: 9.1 g/dL — ABNORMAL LOW (ref 13.0–17.0)
MCH: 28.3 pg (ref 26.0–34.0)
MCH: 29.4 pg (ref 26.0–34.0)
MCHC: 29.7 g/dL — ABNORMAL LOW (ref 30.0–36.0)
MCHC: 30.8 g/dL (ref 30.0–36.0)
MCV: 95.2 fL (ref 80.0–100.0)
MCV: 95.5 fL (ref 80.0–100.0)
Platelets: 449 K/uL — ABNORMAL HIGH (ref 150–400)
Platelets: 472 K/uL — ABNORMAL HIGH (ref 150–400)
RBC: 3.09 MIL/uL — ABNORMAL LOW (ref 4.22–5.81)
RBC: 3.15 MIL/uL — ABNORMAL LOW (ref 4.22–5.81)
RDW: 16.5 % — ABNORMAL HIGH (ref 11.5–15.5)
RDW: 16.6 % — ABNORMAL HIGH (ref 11.5–15.5)
WBC: 11.2 K/uL — ABNORMAL HIGH (ref 4.0–10.5)
WBC: 12.1 K/uL — ABNORMAL HIGH (ref 4.0–10.5)
nRBC: 0 % (ref 0.0–0.2)
nRBC: 0 % (ref 0.0–0.2)

## 2024-01-17 LAB — GLUCOSE, CAPILLARY
Glucose-Capillary: 135 mg/dL — ABNORMAL HIGH (ref 70–99)
Glucose-Capillary: 168 mg/dL — ABNORMAL HIGH (ref 70–99)
Glucose-Capillary: 128 mg/dL — ABNORMAL HIGH (ref 70–99)
Glucose-Capillary: 149 mg/dL — ABNORMAL HIGH (ref 70–99)

## 2024-01-17 LAB — FERRITIN: Ferritin: 71 ng/mL (ref 24–336)

## 2024-01-17 LAB — T4, FREE: Free T4: 1.09 ng/dL (ref 0.61–1.12)

## 2024-01-17 MED ORDER — SENNOSIDES-DOCUSATE SODIUM 8.6-50 MG PO TABS
1.0000 | ORAL_TABLET | Freq: Two times a day (BID) | ORAL | Status: DC
  Administered 2024-01-17 – 2024-01-19 (×4): 1 via ORAL
  Filled 2024-01-17 (×6): qty 1

## 2024-01-17 MED ORDER — OXYCODONE HCL 5 MG PO TABS
5.0000 mg | ORAL_TABLET | ORAL | Status: DC | PRN
  Administered 2024-01-17 – 2024-01-18 (×5): 5 mg via ORAL
  Filled 2024-01-17 (×5): qty 1

## 2024-01-17 MED ORDER — SODIUM CHLORIDE 0.9 % IV SOLN: INTRAVENOUS | Status: DC

## 2024-01-17 MED ORDER — GLYCOPYRROLATE 0.2 MG/ML IJ SOLN
0.2000 mg | INTRAMUSCULAR | Status: DC | PRN
  Filled 2024-01-17: qty 1

## 2024-01-17 MED ORDER — HALOPERIDOL LACTATE 5 MG/ML IJ SOLN
0.5000 mg | INTRAMUSCULAR | Status: DC | PRN
  Administered 2024-01-17 – 2024-01-19 (×5): 0.5 mg via INTRAVENOUS
  Filled 2024-01-17 (×5): qty 1

## 2024-01-17 MED ORDER — GLYCOPYRROLATE 1 MG PO TABS: 1.0000 mg | ORAL_TABLET | ORAL | Status: DC | PRN

## 2024-01-17 MED ORDER — AZITHROMYCIN 250 MG PO TABS
250.0000 mg | ORAL_TABLET | Freq: Every day | ORAL | Status: DC
  Administered 2024-01-18 – 2024-01-19 (×2): 250 mg via ORAL
  Filled 2024-01-17 (×2): qty 1

## 2024-01-17 MED ORDER — IPRATROPIUM-ALBUTEROL 0.5-2.5 (3) MG/3ML IN SOLN
3.0000 mL | Freq: Three times a day (TID) | RESPIRATORY_TRACT | Status: DC
  Administered 2024-01-17 (×2): 3 mL via RESPIRATORY_TRACT
  Filled 2024-01-17 (×2): qty 3

## 2024-01-17 MED ORDER — MORPHINE SULFATE (PF) 4 MG/ML IV SOLN
3.0000 mg | INTRAVENOUS | Status: DC | PRN
  Administered 2024-01-17 – 2024-01-18 (×6): 3 mg via INTRAVENOUS
  Filled 2024-01-17 (×6): qty 1

## 2024-01-17 MED ORDER — AZITHROMYCIN 250 MG PO TABS
500.0000 mg | ORAL_TABLET | Freq: Every day | ORAL | Status: AC
  Administered 2024-01-17: 500 mg via ORAL
  Filled 2024-01-17: qty 2

## 2024-01-17 MED ORDER — BIOTENE DRY MOUTH MT LIQD: 15.0000 mL | OROMUCOSAL | Status: DC | PRN

## 2024-01-17 MED ORDER — NICOTINE 14 MG/24HR TD PT24
14.0000 mg | MEDICATED_PATCH | Freq: Every day | TRANSDERMAL | Status: DC
  Administered 2024-01-17 – 2024-01-20 (×4): 14 mg via TRANSDERMAL
  Filled 2024-01-17 (×4): qty 1

## 2024-01-17 MED ORDER — MORPHINE SULFATE (PF) 4 MG/ML IV SOLN
3.0000 mg | Freq: Once | INTRAVENOUS | Status: AC
  Administered 2024-01-17: 3 mg via INTRAVENOUS

## 2024-01-17 MED ORDER — BACITRACIN ZINC 500 UNIT/GM EX OINT
TOPICAL_OINTMENT | Freq: Two times a day (BID) | CUTANEOUS | Status: DC
  Administered 2024-01-17 – 2024-01-19 (×4): 1 via TOPICAL
  Filled 2024-01-17 (×6): qty 0.9

## 2024-01-17 MED ORDER — HALOPERIDOL LACTATE 2 MG/ML PO CONC: 0.5000 mg | ORAL | Status: DC | PRN

## 2024-01-17 MED ORDER — GLYCOPYRROLATE 0.2 MG/ML IJ SOLN
0.2000 mg | INTRAMUSCULAR | Status: DC | PRN
  Administered 2024-01-19 – 2024-01-20 (×3): 0.2 mg via INTRAVENOUS
  Filled 2024-01-17 (×2): qty 1

## 2024-01-17 MED ORDER — IOHEXOL 300 MG/ML  SOLN
75.0000 mL | Freq: Once | INTRAMUSCULAR | Status: AC | PRN
  Administered 2024-01-17: 75 mL via INTRAVENOUS

## 2024-01-17 MED ORDER — POLYVINYL ALCOHOL 1.4 % OP SOLN: 1.0000 [drp] | Freq: Four times a day (QID) | OPHTHALMIC | Status: DC | PRN

## 2024-01-17 MED ORDER — HALOPERIDOL 0.5 MG PO TABS: 0.5000 mg | ORAL_TABLET | ORAL | Status: DC | PRN

## 2024-01-17 NOTE — Consult Note (Signed)
 WOC Nurse Consult Note: patient with multiple falls at home  Reason for Consult:  wound to L hand and L great and 2nd toes  Wound type: 1. Full thickness L hand skin tear r/t trauma 50% tan 50% red lateral aspect; partial thickness L hand  base of index finger red hemorrhagic tissue  2.  Full thickness L great toe appears to have avulsed toenail dry hemorrhagic tissue  Pressure Injury POA: NA  Measurement: see nursing flowsheet  Wound bed: as above  Drainage (amount, consistency, odor) see nursing flowsheet  Periwound: ecchymosis to hand; L 2nd toe edematous and ecchymotic  Dressing procedure/placement/frequency: Cleanse L hand skin tears with Vashe wound cleanser, apply Bacitracin  to wound beds 2 times daily and secure with Telfa nonstick dressing and Kerlix roll gauze or silicone foam whichever works best.  Cleanse L great toe/toenail area with Vashe, apply Bacitracin  2 times daily and secure with Telfa and gauze or silicone foam whichever works best.  POC discussed with Nurse, mental health. WOC team will not follow. Re-consult if further needs arise.   Thank you,    Powell Bar MSN, RN-BC, [employer]

## 2024-01-17 NOTE — Progress Notes (Signed)
 [facility] Hospitalized Hospice Patient  [name] is a current hospice patient followed at home for terminal diagnosis of Emphysema.  Patient experienced an unwitnessed fall at his home and was brought to [name] ED for further evaluation.  Patient was admitted on 9.13.25 with diagnosis of Left Patella fracture and upper GI bleed.  Per Dr. Odella Pepper, hospice physician, this is a related hospitalization.  Ortho ordered a Bledsoe lock in extension brace.  Plan on repeat scan later in the week to see if surgery is necessary.  Palliative consulted for goals of care discussion due to new lung mass.   Patient is appropriate for GIP level of care requiring IV medications to manage pain in left patella due to fracture after a fall.  Vital Signs: T 98.5 oral, BP 153/69, P 93, R 23, Oxi 96% on RA  Abnormal Labs: Hgb 9.1, Hct 29.5, RBC 3.09, Platelets 449, WBC 11.2, Na 134, Cl 90, Ca 7.6, Ma 1.6 Glucose 127, HGB A1C 5.8, BUN 29 , Creat 1.51, Ca 7.5, Albumin 3.1, Iron 20, TSH 6.35, fecal occult- positive  IV Meds/PRN Protonix  40mg  IV BID 0.9% NS infusion @ 75ml/hr Fentanyl 50mcg IV x1 by EMS in route to hospital Morphine  3mg  IV every 3 hours prn- received @ 0134, 0733, 1035 today   Diagnostics: CT Cervical Spine Wo Contrast Result Date: 01/16/2024 CLINICAL DATA:  78 year old male with recurrent falls. EXAM: CT CERVICAL SPINE WITHOUT CONTRAST TECHNIQUE: Multidetector CT imaging of the cervical spine was performed without intravenous contrast. Multiplanar CT image reconstructions were also generated. RADIATION DOSE REDUCTION: This exam was performed according to the departmental dose-optimization program which includes automated exposure control, adjustment of the mA and/or kV according to patient size and/or use of iterative reconstruction technique. COMPARISON:  Head CT today.  Cervical spine CT 01/14/2024. FINDINGS: Alignment: Stable reversal of cervical lordosis.  Cervicothoracic junction alignment is within normal limits. Bilateral posterior element alignment is within normal limits. Skull base and vertebrae: Visualized skull base is intact. No atlanto-occipital dissociation. C1 and C2 appear intact and aligned. No acute osseous abnormality identified. Soft tissues and spinal canal: Calcified cervical carotid atherosclerosis. Otherwise negative visible noncontrast neck soft tissues. Disc levels: Chronic cervical spine degeneration, with developing degenerative facet ankylosis on the left at C3-C4. Chronic disc and endplate degeneration maximal at C5-C6. Upper chest: Visible upper thoracic levels appear intact. Generalized pulmonary septal thickening in the lung apices appears stable, nonspecific. And somewhat similar to 2021 neck CTA appearance. IMPRESSION: 1. No acute traumatic injury identified in the cervical spine. 2. Chronic cervical spine degeneration. 3. 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 Electronically Signed   By: VEAR Hurst M.D.   On: 01/16/2024 12:14    CT Head Wo Contrast Result Date: 01/16/2024 CLINICAL DATA:  78 year old male with recurrent falls. EXAM: CT HEAD WITHOUT CONTRAST TECHNIQUE: Contiguous axial images were obtained from the base of the skull through the vertex without intravenous contrast. RADIATION DOSE REDUCTION: This exam was performed according to the departmental dose-optimization program which includes automated exposure control, adjustment of the mA and/or kV according to patient size and/or use of iterative reconstruction technique. COMPARISON:  Brain MRI 12/05/2019.  Head CT 01/14/2024. FINDINGS: Brain: No midline shift, ventriculomegaly, mass effect, evidence of mass lesion, intracranial hemorrhage or evidence of cortically based acute infarction. Confluent bilateral cerebral white matter hypodensity is stable. Vascular: Calcified atherosclerosis at the skull base. No  suspicious intracranial vascular hyperdensity. Skull: Stable, no  acute fracture identified. Nonspecific multiple small but circumscribed lucent areas at the skull vertex, but these appears stable since at least 2021 and benign. Sinuses/Orbits: Visualized paranasal sinuses and mastoids are stable and well aerated. Other: No acute orbit or scalp soft tissue injury identified. IMPRESSION: 1. No acute intracranial abnormality or acute traumatic injury identified. 2. Stable chronic white matter disease. Electronically Signed   By: VEAR Hurst M.D.   On: 01/16/2024 12:11    DG Knee Complete 4 Views Left Result Date: 01/16/2024 CLINICAL DATA:  Patellar fracture EXAM: LEFT KNEE - COMPLETE 4+ VIEW COMPARISON:  01/14/2024 FINDINGS: Stable transverse patellar fracture and knee effusion. Prepatellar soft tissue swelling. No new fracture identified. IMPRESSION: 1. Stable transverse patellar fracture and knee effusion. Prepatellar soft tissue swelling. Electronically Signed   By: Ryan Salvage M.D.   On: 01/16/2024 12:07    DG Femur Min 2 Views Left Result Date: 01/16/2024 CLINICAL DATA:  Fall, hip and knee pain EXAM: LEFT FEMUR 2 VIEWS COMPARISON:  None Available. FINDINGS: Atheromatous vascular calcifications. Knee effusion observed with a transverse patellar fracture. Cross-table lateral view of the proximal femur severely suboptimal related to patient mobility and body habitus issues. IMPRESSION: 1. Transverse patellar fracture with knee effusion. 2. Atheromatous vascular calcifications. 3. Severely suboptimal cross-table lateral view of the proximal femur related to patient mobility and body habitus issues. Electronically Signed   By: Ryan Salvage M.D.   On: 01/16/2024 12:02    DG Pelvis Portable Result Date: 01/16/2024 CLINICAL DATA:  Fall, left hip pain EXAM: PORTABLE PELVIS 1-2 VIEWS COMPARISON:  None Available. FINDINGS: Limited assessment due to patient body habitus and difficulty positioning related to  mobility and pain. In particular, there is poor definition of portions of the left iliac bone and left hemipelvis. The pelvis is mildly rotated to the left. No overt cortical discontinuity characteristic fracture is identified. IMPRESSION: 1. Limited assessment due to patient body habitus and difficulty positioning related to mobility and pain. In particular, there is poor definition of portions of the left iliac bone and left hemipelvis. No overt cortical discontinuity characteristic of fracture is identified. Electronically Signed   By: Ryan Salvage M.D.   On: 01/16/2024 12:00    DG Chest Portable 1 View Result Date: 01/16/2024 CLINICAL DATA:  Fall during transport to bedside commode. History of patellar fracture due to a prior fall. EXAM: PORTABLE CHEST 1 VIEW COMPARISON:  12/05/2019 FINDINGS: New masslike density projecting over the lingula measuring proximally 9.6 by 7.7 cm. Malignancy is not excluded and chest CT (with contrast if feasible) is recommended for further characterization. Emphysema noted. Mild enlargement of the cardiopericardial silhouette. Aortic atherosclerosis noted. Airway thickening is present, suggesting bronchitis or reactive airways disease. IMPRESSION: 1. New masslike density projecting over the lingula measuring 9.6 x 7.7 cm. Malignancy such as lung cancer is not excluded and chest CT (with contrast if feasible) is recommended for further characterization. 2. Airway thickening is present, suggesting bronchitis or reactive airways disease. 3. Mild enlargement of the cardiopericardial silhouette. 4. Aortic Atherosclerosis (ICD10-I70.0) and Emphysema (ICD10-J43.9). Electronically Signed   By: Ryan Salvage M.D.   On: 01/16/2024 11:5 CT Cervical Spine Wo Contrast Result Date: 01/16/2024 CLINICAL DATA:  78 year old male with recurrent falls. EXAM: CT CERVICAL SPINE WITHOUT CONTRAST TECHNIQUE: Multidetector CT imaging of the cervical spine was performed without intravenous  contrast. Multiplanar CT image reconstructions were also generated. RADIATION DOSE REDUCTION: This exam was performed according to the departmental dose-optimization program which includes automated exposure control,  adjustment of the mA and/or kV according to patient size and/or use of iterative reconstruction technique. COMPARISON:  Head CT today.  Cervical spine CT 01/14/2024. FINDINGS: Alignment: Stable reversal of cervical lordosis. Cervicothoracic junction alignment is within normal limits. Bilateral posterior element alignment is within normal limits. Skull base and vertebrae: Visualized skull base is intact. No atlanto-occipital dissociation. C1 and C2 appear intact and aligned. No acute osseous abnormality identified. Soft tissues and spinal canal: Calcified cervical carotid atherosclerosis. Otherwise negative visible noncontrast neck soft tissues. Disc levels: Chronic cervical spine degeneration, with developing degenerative facet ankylosis on the left at C3-C4. Chronic disc and endplate degeneration maximal at C5-C6. Upper chest: Visible upper thoracic levels appear intact. Generalized pulmonary septal thickening in the lung apices appears stable, nonspecific. And somewhat similar to 2021 neck CTA appearance. IMPRESSION: 1. No acute traumatic injury identified in the cervical spine. 2. Chronic cervical spine degeneration. 3. 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 Electronically Signed   By: VEAR Hurst M.D.   On: 01/16/2024 12:14    CT Head Wo Contrast Result Date: 01/16/2024 CLINICAL DATA:  78 year old male with recurrent falls. EXAM: CT HEAD WITHOUT CONTRAST TECHNIQUE: Contiguous axial images were obtained from the base of the skull through the vertex without intravenous contrast. RADIATION DOSE REDUCTION: This exam was performed according to the departmental dose-optimization program which includes automated exposure control, adjustment of  the mA and/or kV according to patient size and/or use of iterative reconstruction technique. COMPARISON:  Brain MRI 12/05/2019.  Head CT 01/14/2024. FINDINGS: Brain: No midline shift, ventriculomegaly, mass effect, evidence of mass lesion, intracranial hemorrhage or evidence of cortically based acute infarction. Confluent bilateral cerebral white matter hypodensity is stable. Vascular: Calcified atherosclerosis at the skull base. No suspicious intracranial vascular hyperdensity. Skull: Stable, no acute fracture identified. Nonspecific multiple small but circumscribed lucent areas at the skull vertex, but these appears stable since at least 2021 and benign. Sinuses/Orbits: Visualized paranasal sinuses and mastoids are stable and well aerated. Other: No acute orbit or scalp soft tissue injury identified. IMPRESSION: 1. No acute intracranial abnormality or acute traumatic injury identified. 2. Stable chronic white matter disease. Electronically Signed   By: VEAR Hurst M.D.   On: 01/16/2024 12:11    DG Knee Complete 4 Views Left Result Date: 01/16/2024 CLINICAL DATA:  Patellar fracture EXAM: LEFT KNEE - COMPLETE 4+ VIEW COMPARISON:  01/14/2024 FINDINGS: Stable transverse patellar fracture and knee effusion. Prepatellar soft tissue swelling. No new fracture identified. IMPRESSION: 1. Stable transverse patellar fracture and knee effusion. Prepatellar soft tissue swelling. Electronically Signed   By: Ryan Salvage M.D.   On: 01/16/2024 12:07    DG Femur Min 2 Views Left Result Date: 01/16/2024 CLINICAL DATA:  Fall, hip and knee pain EXAM: LEFT FEMUR 2 VIEWS COMPARISON:  None Available. FINDINGS: Atheromatous vascular calcifications. Knee effusion observed with a transverse patellar fracture. Cross-table lateral view of the proximal femur severely suboptimal related to patient mobility and body habitus issues. IMPRESSION: 1. Transverse patellar fracture with knee effusion. 2. Atheromatous vascular calcifications. 3.  Severely suboptimal cross-table lateral view of the proximal femur related to patient mobility and body habitus issues. Electronically Signed   By: Ryan Salvage M.D.   On: 01/16/2024 12:02    DG Pelvis Portable Result Date: 01/16/2024 CLINICAL DATA:  Fall, left hip pain EXAM: PORTABLE PELVIS 1-2 VIEWS COMPARISON:  None Available. FINDINGS: Limited assessment due to patient body habitus and difficulty positioning related to mobility  and pain. In particular, there is poor definition of portions of the left iliac bone and left hemipelvis. The pelvis is mildly rotated to the left. No overt cortical discontinuity characteristic fracture is identified. IMPRESSION: 1. Limited assessment due to patient body habitus and difficulty positioning related to mobility and pain. In particular, there is poor definition of portions of the left iliac bone and left hemipelvis. No overt cortical discontinuity characteristic of fracture is identified. Electronically Signed   By: Ryan Salvage M.D.   On: 01/16/2024 12:00    DG Chest Portable 1 View Result Date: 01/16/2024 CLINICAL DATA:  Fall during transport to bedside commode. History of patellar fracture due to a prior fall. EXAM: PORTABLE CHEST 1 VIEW COMPARISON:  12/05/2019 FINDINGS: New masslike density projecting over the lingula measuring proximally 9.6 by 7.7 cm. Malignancy is not excluded and chest CT (with contrast if feasible) is recommended for further characterization. Emphysema noted. Mild enlargement of the cardiopericardial silhouette. Aortic atherosclerosis noted. Airway thickening is present, suggesting bronchitis or reactive airways disease. IMPRESSION: 1. New masslike density projecting over the lingula measuring 9.6 x 7.7 cm. Malignancy such as lung cancer is not excluded and chest CT (with contrast if feasible) is recommended for further characterization. 2. Airway thickening is present, suggesting bronchitis or reactive airways disease. 3. Mild  enlargement of the cardiopericardial silhouette. 4. Aortic Atherosclerosis (ICD10-I70.0) and Emphysema (ICD10-J43.9). Electronically Signed   By: Ryan Salvage M.D.   On: 01/16/2024 11:59       EKG: Independently reviewed.  NSR with rate 89; nonspecific ST changes with no evidence of acute ischemia. RBBB and LAFB. Artifact throughout. 9       EKG: Independently reviewed.  NSR with rate 89; nonspecific ST changes with no evidence of acute ischemia. RBBB and LAFB. Artifact throughout.    Assessment: Left patella fracture comminuted but relatively small amount of displacement   Plan: per Ortho Consult 9.14.25 by Dr. Evalene Chancy I had a long talk with his family we discussed the risks of surgery including cardiovascular compromise and infection we also discussed possible pathway for nonoperative management   For now we are gena continue conservative management keeping his leg in extension we will reimage it later this week if he is showing interval displacement could consider ORIF   Protein supplementation given low albumin would be helpful for healing   CAT scan of the knee  I will order a Bledsoe locked in extension full-time  He can weight-bear as tolerated when locked in extension with physical therapy    Evalene JONETTA Chancy, MD -01/17/2024-  10:58 AM  ____________________________________  Assessment and Plan:  per H&P by Dr. Isaiah Geralds 9.13.25 Primary- left transverse patellar fracture 78 year old male presenting to ED after falling and suffering a left displaced patellar fracture on 9/11 who fell again at home onto his knees and has been unable to bear weight or do ADLs so family called EMS to bring back to hospital  -admit to progressive -repeat films show known displaced left patellar fracture with effusion -knee brace placed back on patient, would not wear at home  -Consulted and discussed with Dr. Chancy, they will see tomorrow  -will need to figure out GOC with  palliative and ortho  -continue pain management  -fall risk    Upper GI bleed +fecal occult in setting of anemia (although unsure of baseline) new from 3 years ago Endorses hemotysis in setting of new lung mass seen on imaging and daughter reports black  appearing stool  No NSAID use per daughter  No active bleed, BUN at 34. Unsure of baseline Start protonix  BID Clear liquid diet  Cbc q 6 hours Type and screen and transfuse if hgb <7. He consented to transfusions and discussed with daughter  Patient is hospice patient and will need GOC meeting with family to discuss plan. Palliative care consulted.  Will hold on GI consult until this is done   Normocytic anemia No recent labs anywhere to figure out baseline Fecal occult positive Family endorses hemoptysis and black stools  Check iron studies, B12 Trend CBC q 6 hours Type and screen  Transfuse to keep hgb >7    Lung mass New lung mass, need chest CT with contrast to further investigate Very gentle IVF overnight and hopefully creatinine improved for CT tomorrow  Hx of hemoptysis in smoker, concerning for cancer  He is on hospice and needs GOC conversation with family/palliative care to figure out steps forward for this    CKD (chronic kidney disease), stage II No recent labs, unsure of baseline (creatinine normal in 04/2022)  Acute vs. Chronic?  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 [place] -on hospice for end stage COPD  -at baseline home oxygen  -no s/sx of flare  -continues to smoke  -restart symbicort -continue duoneb, SABA PRN  -continue morphine  PRN  -refuses cpap at night    Controlled type 2 diabetes mellitus without complication, without long-term current use of insulin  (HCC) Check A1C, none in records SSI and accucheck qac/hs  Hold metformin     Essential  hypertension Hold lisinopril  for now, unsure of baseline creatinine Hold lasix  Trend bmp PRN hydralazine  for pressures    Chronic diastolic CHF (congestive heart failure) (HCC) Echo 2021, normal EF with grade 1 DD  Does not appear volume overloaded  On lasix 40mg  BID and metolazone 2.5mg  twice a week  Hold lasix/lisinopril  for now  Strict I/O    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 -check TSH/free T4 -continue synthroid     Hyperlipidemia -check CK with falls -no longer on statin likely due to hospice status    Memory loss No official diagnosis of dementia per family, but clinically has significant short term memory loss  Delirium precautions Likely need SNF SW consulted    OSA (obstructive sleep apnea) Refuses cpap at night    Tobacco use No desire to stop Nicotine  patch  ___________________________________  Discharge Planning-  ongoing- Day 1 of hospitalization Goals of Care- clear, patient is DNR IDT- updated Family Contact:  Palliative met with 2 daughters, Pennie and [name].  Not a candidate for intervention for lung mass and comfort would be recommended.  Daughter's concern for his LOC is more than they can handle in the home.  LTC vs. Continued home with hospice.  Palliative checking with ortho to see if giving patient a block is possible for pain management.  Hospice Detail Summary Report set for upload into patient's records on 9.13.25 by Nat Babe, [name] Nurse Liaison  Please do not hesitate to call with any hospice related questions or concerns.  When patient is ready for discharge please use [facility] as we contract this service for our patients.   Saddie HILARIO Na, RN Hospital Nurse Liaison [phone]

## 2024-01-17 NOTE — Consult Note (Signed)
 Consultation Note Date: 01/17/2024   Patient Name: Matthew [name]  DOB: [birth year]  MRN: [id number]  Age / Sex: 78 y.o., male   PCP: [name], MD Referring Physician: [name], MD  Reason for Consultation: Establishing goals of care     Chief Complaint/History of Present Illness:   Patient is a 78 year old make with a PMHx of cognitive impairment, T2DM, HTN, HLD, end-stage COPD receiving [facility] hospice support at home, CKD stage II, history of thyroid  cancer, OSA not on CPAP, history of ICH due to HTN, and chronic respiratory failure on [place] O2 who was admitted on 01/16/24 for management after a fall. Patient had fallen in his bathroom and couldn't walk so he crawled to his living and called his daughter. When daughter an friend attempted to help lift him, friend fall and patient fell on top of the friend causing his knees to hit the floor and patient was screaming so 911 was call. Since admission, patient has received management for left transverse patellar fracture, UGIB, and newly discussed lung mass on imaging. Orthopedics consulted for recommendations. Palliative medicine team consulted to assist with complex medical decision making.  Extensive review of EMR including recent documentation from hospitalist and orthopedist. Continue with conservative management of patient's left patella fracture comminuted with small amount of displacement at this time. Has ordered for CT scan of the knee and application of [place] locked in extension full-time.  Review of recent CMP noted BUN elevated at 29 though creatine at 1.21 so GFR estimated over 60. Patient's albumin low at 3.1 Personally reviewed chest Xray obtain on 01/16/24 showing mass on left side of lung. Noted to be measured at 9.6 x 7.7 cm.   Presented to bedside to see patient. Discussed care with RN who was at bedside assisting with care. RN noted that family stated patient has short term memory loss. No family currently at  bedside though RN able to call daughter, [name], about meeting now.  Able to introduce myself to the patient as a member of the palliative medicine team and my role in patient's care. Patient noting he is having worsening shortness of breath. RN noted IV morphine  provided recently though approved for another one time dose to be ordered due to patient's clear work of breathing. Patient noting he wants to be comfort and left alone. He doesn't care about work up of his mass and notes he wants his four daughters to make decisions about his care. He is unsure if he has ACP documentation. Informed me his four daughters are: [name] [name] No, and [name].   ------------------------------------------------------------------------------------------------------------- Advance Care Planning Conversation  Pertinent diagnosis: End-stage COPD previously receiving hospice support at home, cognitive impairment, lung mass, new patellar fracture, poor functional status  The patient and/or family consented to a voluntary Advance Care Planning Conversation in person/over the phone. Individuals present for the conversation: Patient, patient's 2 daughters and she and [name], and their significant others, and this provider  Summary of the conversation:  Able to call patient's daughter, [name], and introduced myself. [name] is currently heading into the hospital to meet. [name] noted she is patient's financial POA thought patient does not have HCPOA. Discuss that is someone is not married in the state of [place], majority of adult children over the age of 20 would need to make decisions on behalf of patient. [name] was able to call her sister, [name], on the phone too so she is coming back to hospital to discuss patient's care.  [name] noted their other two sister are unavailable as one works third shift and so is asleep and can't join conversation and other sister hasn't been able to be reached by phone despite phone calls. Noted would plan to  discuss care with [name] and [name] then who are reasonably available to engage in goals of care conversation once they get here to the hospital. Did inform [name] that based on patient's medical coomorbidities, comfort focused care would be appropraite. [name] noted that the ortho provider had already discussed surgery with them and the high risk that would entail so that already decided to focus on conservative management for this. Had also expressed concern that would patient's severe underlying lung disease, pursuing workup of lung mass also posses risks and concerned pursuing work up if not going to be able to do therapy for management makes sense. [name] acknowledged this. Noted would discuss further once they are both here.   Patient's daughters [name] and [name] were able to come to bedside along with their significant others.  Inquired with patient about discussion in his presence versus talking separately.  Patient did not want to be bothered by conversations so able to discuss with to daughter separately.  Again patient's 2 other daughters, [name] and [name], could not be reached for conversation as baby is sleeping since she works third shift and [name] was unavailable by phone.  Of note patient's 2 daughters [name] and [name] are listed in patient's emergency contacts while other 2 daughters are not. Again introduced myself as a member of the palliative medicine team my role in patient's medical journey.  Able to discuss care planning with [facility] and [name] moving forward.  Discussed possible pathways for medical care.  Spent time explaining patient's severity of medical illness.  Patient had been on hospice support at home due to his severity of illness.  Patient now has multiple complications including likely lung cancer and patellar fracture.  Discussed patient has stated that he does want to be bothered and attempting to respect wishes to be comfortable at this time.  Daughters acknowledged that patient has been  against aggressive medical interventions and has wanted to focus on comfort.  Noted continued support of comfort focused care at this time.  Discussed discontinuation of interventions such as lab work and further workup of lung mass.  Did discuss with them recent CT findings noting lung mass believed to be cancer.  Expressed concern that with patient's severe underlying lung disease, obtaining biopsy would pose its own side of risk and once that biopsy was obtained, would be performed based on biopsy anyway since patient has very poor functional status and would not be a candidate for cancer directed therapies since he would not be able to tolerate them.  Daughters acknowledged this and agree with not pursuing workup of lung mass. We discussed instead focusing on full comfort at this time while allowing medications for pain and work of breathing.  Noted would reach out to orthopedist about local block for pain management since this had already been discussed and would be palliative in nature.  Spent time explaining use of opioids to help with shortness of breath management and patient's with end-stage lung disease and now likely lung cancer diagnosis with left upper lobe mass.  Noted starting oral medications to help with breathing and patient may need long-acting opioid moving forward if requiring enough short acting medication.  Discussed how patient has increased work of breathing and had specifically asked for medication earlier due  to his discomfort associated with this.  Daughters agreeing with this plan.  Discussed hospice support moving forward.  Spent time reviewing that patient was getting hospice support at home.  Noted if patient were to return home he would need 24/7 care assistance.  Discussed that this is not provided by hospice since someone from hospice is not physically present so care would fall to family or hiring caregivers to assist with this.  Discussed if this is not an option, patient  would likely need long-term care placement with hospice support.  Explained that that this would incur an out-of-pocket cost for room and board since that is not paid for by hospice.  Also introduced concept of inpatient hospice facility though explained that 1 would need to meet Medicare guidelines to be appropriately accepted there.  Patient not currently appropriate for inpatient hospice though if he deteriorated acutely, may become so.  Spent time answering questions as able regarding this.  Daughter is going to discuss options about possibly getting home with hospice support and hiring caregivers 24/7 versus long-term care with hospice support.  All questions answered at that time.  Represented to room with daughters and able to again explain patient has lung cancer since he does not remember this.  Patient did not even remember this provider discussing care with him within the past hour.  Answered questions from daughter significant others as well at that time.  Outcome of the conversations and/or documents completed:  Focusing on patient's comfort at this time.  Working on discharge planning with hospice support.  Patient would need 24/7 assistance at home if is to return there versus long-term care placement with hospice.  I spent 60 minutes providing separately identifiable ACP services with the patient and/or surrogate decision maker in a voluntary, in-person conversation discussing the patient's wishes and goals as detailed in the above note.  Tinnie Radar, DO Palliative Medicine Provider  -------------------------------------------------------------------------------------------------------------  Provided emotional support reactive listening.  Noted palliative medicine team continue to follow along with patient's medical journey.  Discussed care with hospitalist, RN, TOC, ACC liaison, GI, and orthopedist to coordinate care.  Primary Diagnoses  Present on Admission:  COPD with chronic  respiratory failure on 4L [place]  CKD (chronic kidney disease), stage II  Hyperlipidemia  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  Tobacco use   Past Medical History:  Diagnosis Date   Adenomatous polyps    Benign neoplasm of colon    Chronic kidney disease    stage 2   COPD (chronic obstructive pulmonary disease) (HCC)    Diabetes mellitus without complication (HCC)    External hemorrhoids    Hemorrhoids    Thyroid  cancer (HCC)    Social History   Socioeconomic History   Marital status: Single    Spouse name: Not on file   Number of children: 4   Years of education: 8   Highest education level: Not on file  Occupational History    Comment: retired  Tobacco Use   Smoking status: Every Day    Current packs/day: 1.50    Types: Cigarettes   Smokeless tobacco: Never   Tobacco comments:    Counseling sheet given in exam room for smoking   Vaping Use   Vaping status: Unknown  Substance and Sexual Activity   Alcohol  use: Not Currently   Drug use: No   Sexual activity: Not on file  Other Topics Concern   Not on file  Social History Narrative   Lives alone  Caffeine- coffee 6 cups, 1/2 Pepsi maybe   Social Drivers of Corporate investment banker Strain: Not on file  Food Insecurity: No Food Insecurity (01/16/2024)   Hunger Vital Sign    Worried About Running Out of Food in the Last Year: Never true    Ran Out of Food in the Last Year: Never true  Transportation Needs: No Transportation Needs (01/16/2024)   PRAPARE - Administrator, Civil Service (Medical): No    Lack of Transportation (Non-Medical): No  Physical Activity: Not on file  Stress: Not on file  Social Connections: Socially Isolated (01/16/2024)   Social Connection and Isolation Panel    Frequency of Communication with Friends and Family: More than three times a week    Frequency of Social Gatherings with Friends and Family: Three times a week    Attends Religious Services: Never     Active Member of Clubs or Organizations: No    Attends Banker Meetings: Never    Marital Status: Divorced   Family History  Problem Relation Age of Onset   Heart attack Father    Hypertension Father    Breast cancer Mother    Hypertension Mother    Hypertension Brother    Heart attack Brother    Heart attack Sister    Scheduled Meds:  [START ON 01/18/2024] azithromycin   250 mg Oral Daily   bacitracin    Topical BID   fluticasone  furoate-vilanterol  1 puff Inhalation Daily   insulin  aspart  0-9 Units Subcutaneous TID WC   levothyroxine   200 mcg Oral QAC breakfast   pantoprazole  (PROTONIX ) IV  40 mg Intravenous Q12H   senna-docusate  1 tablet Oral BID   traMADol   50 mg Oral Q12H   Continuous Infusions:  sodium chloride  75 mL/hr at 01/17/24 0844   PRN Meds:.acetaminophen  **OR** acetaminophen , hydrALAZINE , ipratropium-albuterol , morphine  injection, ondansetron  **OR** ondansetron  (ZOFRAN ) IV No Known Allergies CBC:    Component Value Date/Time   WBC 11.2 (H) 01/17/2024 0400   HGB 9.1 (L) 01/17/2024 0400   HCT 29.5 (L) 01/17/2024 0400   PLT 449 (H) 01/17/2024 0400   MCV 95.5 01/17/2024 0400   NEUTROABS 8.8 (H) 01/16/2024 1044   LYMPHSABS 1.0 01/16/2024 1044   MONOABS 0.8 01/16/2024 1044   EOSABS 0.1 01/16/2024 1044   BASOSABS 0.1 01/16/2024 1044   Comprehensive Metabolic Panel:    Component Value Date/Time   NA 134 (L) 01/17/2024 0625   K 3.7 01/17/2024 0625   CL 90 (L) 01/17/2024 0625   CO2 29 01/17/2024 0625   BUN 29 (H) 01/17/2024 0625   CREATININE 1.21 01/17/2024 0625   GLUCOSE 127 (H) 01/17/2024 0625   CALCIUM  7.6 (L) 01/17/2024 0625   AST 25 01/16/2024 1044   ALT 16 01/16/2024 1044   ALKPHOS 110 01/16/2024 1044   BILITOT 0.2 01/16/2024 1044   PROT 6.5 01/16/2024 1044   ALBUMIN 3.1 (L) 01/16/2024 1044    Physical Exam: Vital Signs: BP (!) 150/75 (BP Location: Left Arm)   Pulse 95   Temp 98 F (36.7 C)   Resp (!) 24   Ht 5' 10 (1.778 m)    Wt 105.5 kg   SpO2 96%   BMI 33.37 kg/m  SpO2: SpO2: 96 % O2 Device: O2 Device: Nasal Cannula O2 Flow Rate: O2 Flow Rate (L/min): 3 L/min Intake/output summary:  Intake/Output Summary (Last 24 hours) at 01/17/2024 1414 Last data filed at 01/17/2024 0600 Gross per 24 hour  Intake 634.57  ml  Output 620 ml  Net 14.57 ml   LBM: Last BM Date : 01/16/24 Baseline Weight: Weight: 98.9 kg Most recent weight: Weight: 105.5 kg  General: NAD, awake though easily confused, chronically ill-appearing Cardiovascular: Tachycardia noted Respiratory: increased work of breathing noted, on nasal cannula O2 Abdomen: Obese, soft to palpation Neuro: Awake though easily confused, short-term memory loss          Palliative Performance Scale: 50%              Additional Data Reviewed: Recent Labs    01/16/24 1044 01/16/24 1715 01/16/24 2336 01/17/24 0400 01/17/24 0625  WBC 10.9*   < > 12.1* 11.2*  --   HGB 9.5*   < > 8.9* 9.1*  --   PLT 471*   < > 472* 449*  --   NA 137  --   --   --  134*  BUN 34*  --   --   --  29*  CREATININE 1.51*  --   --   --  1.21   < > = values in this interval not displayed.    Imaging: CT Cervical Spine Wo Contrast CLINICAL DATA:  78 year old male with recurrent falls.  EXAM: CT CERVICAL SPINE WITHOUT CONTRAST  TECHNIQUE: Multidetector CT imaging of the cervical spine was performed without intravenous contrast. Multiplanar CT image reconstructions were also generated.  RADIATION DOSE REDUCTION: This exam was performed according to the departmental dose-optimization program which includes automated exposure control, adjustment of the mA and/or kV according to patient size and/or use of iterative reconstruction technique.  COMPARISON:  Head CT today.  Cervical spine CT 01/14/2024.  FINDINGS: Alignment: Stable reversal of cervical lordosis. Cervicothoracic junction alignment is within normal limits. Bilateral posterior element alignment is within normal  limits.  Skull base and vertebrae: Visualized skull base is intact. No atlanto-occipital dissociation. C1 and C2 appear intact and aligned. No acute osseous abnormality identified.  Soft tissues and spinal canal: Calcified cervical carotid atherosclerosis. Otherwise negative visible noncontrast neck soft tissues.  Disc levels: Chronic cervical spine degeneration, with developing degenerative facet ankylosis on the left at C3-C4. Chronic disc and endplate degeneration maximal at C5-C6.  Upper chest: Visible upper thoracic levels appear intact. Generalized pulmonary septal thickening in the lung apices appears stable, nonspecific. And somewhat similar to 2021 neck CTA appearance.  IMPRESSION: 1. No acute traumatic injury identified in the cervical spine. 2. Chronic cervical spine degeneration. 3. 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  Electronically Signed   By: VEAR Hurst M.D.   On: 01/16/2024 12:14 CT Head Wo Contrast CLINICAL DATA:  78 year old male with recurrent falls.  EXAM: CT HEAD WITHOUT CONTRAST  TECHNIQUE: Contiguous axial images were obtained from the base of the skull through the vertex without intravenous contrast.  RADIATION DOSE REDUCTION: This exam was performed according to the departmental dose-optimization program which includes automated exposure control, adjustment of the mA and/or kV according to patient size and/or use of iterative reconstruction technique.  COMPARISON:  Brain MRI 12/05/2019.  Head CT 01/14/2024.  FINDINGS: Brain: No midline shift, ventriculomegaly, mass effect, evidence of mass lesion, intracranial hemorrhage or evidence of cortically based acute infarction.  Confluent bilateral cerebral white matter hypodensity is stable.  Vascular: Calcified atherosclerosis at the skull base. No suspicious intracranial vascular hyperdensity.  Skull: Stable, no acute fracture  identified. Nonspecific multiple small but circumscribed lucent areas at the skull vertex, but these appears stable since at  least 2021 and benign.  Sinuses/Orbits: Visualized paranasal sinuses and mastoids are stable and well aerated.  Other: No acute orbit or scalp soft tissue injury identified.  IMPRESSION: 1. No acute intracranial abnormality or acute traumatic injury identified. 2. Stable chronic white matter disease.  Electronically Signed   By: VEAR Hurst M.D.   On: 01/16/2024 12:11 DG Knee Complete 4 Views Left CLINICAL DATA:  Patellar fracture  EXAM: LEFT KNEE - COMPLETE 4+ VIEW  COMPARISON:  01/14/2024  FINDINGS: Stable transverse patellar fracture and knee effusion. Prepatellar soft tissue swelling.  No new fracture identified.  IMPRESSION: 1. Stable transverse patellar fracture and knee effusion. Prepatellar soft tissue swelling.  Electronically Signed   By: Ryan Salvage M.D.   On: 01/16/2024 12:07 DG Femur Min 2 Views Left CLINICAL DATA:  Fall, hip and knee pain  EXAM: LEFT FEMUR 2 VIEWS  COMPARISON:  None Available.  FINDINGS: Atheromatous vascular calcifications. Knee effusion observed with a transverse patellar fracture. Cross-table lateral view of the proximal femur severely suboptimal related to patient mobility and body habitus issues.  IMPRESSION: 1. Transverse patellar fracture with knee effusion. 2. Atheromatous vascular calcifications. 3. Severely suboptimal cross-table lateral view of the proximal femur related to patient mobility and body habitus issues.  Electronically Signed   By: Ryan Salvage M.D.   On: 01/16/2024 12:02 DG Pelvis Portable CLINICAL DATA:  Fall, left hip pain  EXAM: PORTABLE PELVIS 1-2 VIEWS  COMPARISON:  None Available.  FINDINGS: Limited assessment due to patient body habitus and difficulty positioning related to mobility and pain. In particular, there is poor definition of portions of the  left iliac bone and left hemipelvis. The pelvis is mildly rotated to the left.  No overt cortical discontinuity characteristic fracture is identified.  IMPRESSION: 1. Limited assessment due to patient body habitus and difficulty positioning related to mobility and pain. In particular, there is poor definition of portions of the left iliac bone and left hemipelvis. No overt cortical discontinuity characteristic of fracture is identified.  Electronically Signed   By: Ryan Salvage M.D.   On: 01/16/2024 12:00 DG Chest Portable 1 View CLINICAL DATA:  Fall during transport to bedside commode. History of patellar fracture due to a prior fall.  EXAM: PORTABLE CHEST 1 VIEW  COMPARISON:  12/05/2019  FINDINGS: New masslike density projecting over the lingula measuring proximally 9.6 by 7.7 cm. Malignancy is not excluded and chest CT (with contrast if feasible) is recommended for further characterization.  Emphysema noted. Mild enlargement of the cardiopericardial silhouette. Aortic atherosclerosis noted.  Airway thickening is present, suggesting bronchitis or reactive airways disease.  IMPRESSION: 1. New masslike density projecting over the lingula measuring 9.6 x 7.7 cm. Malignancy such as lung cancer is not excluded and chest CT (with contrast if feasible) is recommended for further characterization. 2. Airway thickening is present, suggesting bronchitis or reactive airways disease. 3. Mild enlargement of the cardiopericardial silhouette. 4. Aortic Atherosclerosis (ICD10-I70.0) and Emphysema (ICD10-J43.9).  Electronically Signed   By: Ryan Salvage M.D.   On: 01/16/2024 11:59    I personally reviewed recent imaging.   Palliative Care Assessment and Plan Summary of Established Goals of Care and Medical Treatment Preferences   Patient is a 78 year old make with a PMHx of cognitive impairment, T2DM, HTN, HLD, end-stage COPD receiving ACC hospice support at home,  CKD stage II, history of thyroid  cancer, OSA not on CPAP, history of ICH due to HTN, and chronic respiratory failure on [place] O2 who  was admitted on 01/16/24 for management after a fall. Patient had fallen in his bathroom and couldn't walk so he crawled to his living and called his daughter. When daughter an friend attempted to help lift him, friend fall and patient fell on top of the friend causing his knees to hit the floor and patient was screaming so 911 was call. Since admission, patient has received management for left transverse patellar fracture, UGIB, and newly discussed lung mass on imaging. Orthopedics consulted for recommendations. Palliative medicine team consulted to assist with complex medical decision making.  # Complex medical decision making/goals of care  -Patient patient has short-term memory loss and is easily confused.  Patient did state that he wants his 4 daughters to make all decisions for him.  Patient has continued to voice that he does not want to be bothered.  - Discussed care with 2 out of the 4 daughters, [name] and [name], who were reasonably available.  Discussed pathways for medical care moving forward.  Patient has multiple serious medical comorbidities and was on hospice prior to admission.  Patient now has lung mass that is likely lung cancer and patellar fracture which only contributes to patient's multiple severe medical comorbidities.  Discussed aggressive medical pathway versus focusing on patient's comfort.  Patient himself has stated he does not want to be bothered with aggressive care.  Daughter supporting transition to comfort focused care at this time.  (They had concerns reported to nurse when IV fluid stopped though again noted comfort focused care and patient's renal function has already improved so would not recommend fluids as could progress to fluid overload anyway and then they were agreeing to this.) noted will continue medicines for comfort.  Patient needs hospice  support moving forward and family needs to discuss for this can best be provided whether at home with 24/7 caregivers considering paying out-of-pocket for this or going to long-term care facility paying out-of-pocket for room and board with hospice support.  Palliative medicine team will continue to follow with patient's medical journey.    Code Status: Do not attempt resuscitation (DNR) - Comfort care  # Symptom management Patient is receiving these palliative interventions for symptom management with an intent to improve quality of life.     -Pain/Dyspnea, acute in the setting of end-of-life care                Patient was not on medications for pain previously.                               - Change IV morphine  to 3 mg every 3 hours as needed breakthrough pain and severe shortness of breath   - Start oral oxycodone  5 mg every 4 hours as needed.  Continue to adjust based on patient's symptom burden.  Discussed with family will likely need long-acting opioid moving forward due to continuous shortness of breath noted.   - Agree with continuing senna 1 tab twice daily while receiving opioids                  -Anxiety/agitation, in the setting of end-of-life care                               - Start Haldol  as needed.  Continue to adjust based on patient's symptom burden.                 -  Secretions, in the setting of end-of-life care                               -Start glycopyrrolate  as needed  # Psycho-social/Spiritual Support:  - Support System: Daughters- [name] and [name] only 2 daughters listed as emergency contacts in EMR though patient noted to have 2 other daughters, [name] and [place]  # Discharge Planning:  To Be Determined - Involved TOC and [facility] hospice liaison.  Patient had been at home with hospice support through [facility].  Discussed with daughters patient would need 24/7 caregiving at home even if this meant hiring caregivers versus long-term care placement with hospice based on current  evaluation.  Patient not currently appropriate for inpatient hospice unless acutely deteriorates.  Thank you for allowing the palliative care team to participate in the care [facility].  Tinnie Radar, DO Palliative Care Provider PMT # [phone]  If patient remains symptomatic despite maximum doses, please call PMT at [phone] between 0700 and 1900. Outside of these hours, please call attending, as PMT does not have night coverage.  Billing based on MDM: High  Problems Addressed: One or more chronic illnesses with severe exacerbation, progression, or side effects of treatment.  Risks: Parenteral controlled substances

## 2024-01-17 NOTE — Evaluation (Signed)
 Physical Therapy Evaluation Patient Details Name: Matthew [name] MRN: [id number] DOB: 1945/05/27 Today's Date: 01/17/2024  History of Present Illness  78 y.o. male who presented to ED after fall at home. Pt fell,  Pt was seen in ED ~ 2 days prior to this adm, dx with patella fx, placed in KI and sent home via PTAR.  Pt was seen again in ED d/t fall at home, not wearing KI at home per family report ,admitted d/t inability to stand. Left knee xray: Stable transverse patellar fracture and knee effusion.  Prepatellar soft tissue swelling.  Ortho consult pending   PMH: significant of T2DM, HTN, HLD, COPD, CKD stage 2, hx of thyroid  cancer, OSA not on cpap, hx of ICH due to HTN, chronic respiratory failure on  oxygen , tobacco use  Clinical Impression  Pt admitted with above diagnosis.  Pt reports ind at baseline however very limited activity. Pt is currently on hospice at home.  Pt requiring 2 person mod-max assist for bed mobility and transfers.  Patient will benefit from continued inpatient follow up therapy, <3 hours/day   Pt currently with functional limitations due to the deficits listed below (see PT Problem List). Pt will benefit from acute skilled PT to increase their independence and safety with mobility to allow discharge.           If plan is discharge home, recommend the following: Two people to help with walking and/or transfers;Two people to help with bathing/dressing/bathroom;Assist for transportation;Help with stairs or ramp for entrance   Can travel by private vehicle   No    Equipment Recommendations Other (comment) (TBD)  Recommendations for Other Services       Functional Status Assessment Patient has had a recent decline in their functional status and demonstrates the ability to make significant improvements in function in a reasonable and predictable amount of time.     Precautions / Restrictions Precautions Precautions: Fall Required Braces or Orthoses: Knee  Immobilizer - Left;Other Brace Knee Immobilizer - Left: On at all times Other Brace: plan is for Bledsoe brace locked in extension Restrictions Weight Bearing Restrictions Per Provider Order: No LLE Weight Bearing Per Provider Order: Weight bearing as tolerated Other Position/Activity Restrictions: WBAT with brace in full extension      Mobility  Bed Mobility Overal bed mobility: Needs Assistance Bed Mobility: Supine to Sit     Supine to sit: Mod assist, +2 for physical assistance, +2 for safety/equipment     General bed mobility comments: cues for sequence, assist to elevate trunk, progress LEs off bed    Transfers Overall transfer level: Needs assistance Equipment used: Rolling walker (2 wheels) Transfers: Sit to/from Stand, Bed to chair/wheelchair/BSC Sit to Stand: Max assist, +2 physical assistance, +2 safety/equipment   Step pivot transfers: Mod assist, +2 physical assistance, +2 safety/equipment       General transfer comment: multi-modal cues for hand placement and  R & L LE position. assist bring wt up and over BOS: Stand step pivot transfer x2,  assist to balance and mangage RW, +2 needed throughout transfer; incr dyspnea after transfers with SpO2=81% on 2-3L, incr O2 to 4.5L with sats >90%, reduced back to 3L with sats 90-91%    Ambulation/Gait               General Gait Details: pivotal steps only, limited by fatigue  Stairs            Wheelchair Mobility     Tilt Bed  Modified Rankin (Stroke Patients Only)       Balance Overall balance assessment: Needs assistance, History of Falls Sitting-balance support: Single extremity supported, Feet supported Sitting balance-Leahy Scale: Fair Sitting balance - Comments: close supervision   Standing balance support: During functional activity, Reliant on assistive device for balance Standing balance-Leahy Scale: Poor                               Pertinent Vitals/Pain Pain  Assessment Pain Assessment: Faces Faces Pain Scale: Hurts little more Pain Location: LLE Pain Descriptors / Indicators: Sore, Guarding, Grimacing Pain Intervention(s): Limited activity within patient's tolerance, Monitored during session, Premedicated before session, Repositioned    Home Living Family/patient expects to be discharged to:: Private residence Living Arrangements: Spouse/significant other Available Help at Discharge: Family;Available PRN/intermittently Type of Home: House Home Access: Stairs to enter       Home Layout: One level Home Equipment: Agricultural consultant (2 wheels) Additional Comments: uses O2 prn; currently on Hospice    Prior Function Prior Level of Function : Independent/Modified Independent             Mobility Comments: pt reports ind, activity limited d/t fatigue, DOE ADLs Comments: pt states he normally cooks. ind with ADLs     Extremity/Trunk Assessment   Upper Extremity Assessment Upper Extremity Assessment: Generalized weakness    Lower Extremity Assessment Lower Extremity Assessment: Generalized weakness;LLE deficits/detail LLE Deficits / Details: ankle WFL; able to assist with SLRm unable to lift LE against gravity LLE: Unable to fully assess due to immobilization;Unable to fully assess due to pain       Communication   Communication Communication: Impaired Factors Affecting Communication: Hearing impaired    Cognition Arousal: Alert Behavior During Therapy: WFL for tasks assessed/performed   PT - Cognitive impairments: Awareness, Safety/Judgement, Sequencing                         Following commands: Impaired Following commands impaired: Follows one step commands inconsistently     Cueing Cueing Techniques: Verbal cues, Tactile cues, Visual cues     General Comments      Exercises     Assessment/Plan    PT Assessment Patient needs continued PT services  PT Problem List Decreased strength;Decreased  balance;Cardiopulmonary status limiting activity;Decreased activity tolerance;Decreased safety awareness;Decreased mobility       PT Treatment Interventions DME instruction;Therapeutic activities;Gait training;Functional mobility training;Therapeutic exercise;Patient/family education;Balance training    PT Goals (Current goals can be found in the Care Plan section)  Acute Rehab PT Goals PT Goal Formulation: With patient Time For Goal Achievement: 01/31/24 Potential to Achieve Goals: Fair    Frequency Min 2X/week     Co-evaluation               AM-PAC PT 6 Clicks Mobility  Outcome Measure Help needed turning from your back to your side while in a flat bed without using bedrails?: A Lot Help needed moving from lying on your back to sitting on the side of a flat bed without using bedrails?: A Lot Help needed moving to and from a bed to a chair (including a wheelchair)?: A Lot Help needed standing up from a chair using your arms (e.g., wheelchair or bedside chair)?: Total Help needed to walk in hospital room?: Total Help needed climbing 3-5 steps with a railing? : Total 6 Click Score: 9    End of Session Equipment  Utilized During Treatment: Gait belt;Left knee immobilizer Activity Tolerance: Patient limited by fatigue Patient left: with call bell/phone within reach;in chair;with chair alarm set;with family/visitor present;with nursing/sitter in room   PT Visit Diagnosis: Other abnormalities of gait and mobility (R26.89);Repeated falls (R29.6);Unsteadiness on feet (R26.81)    Time: 8855-8786 PT Time Calculation (min) (ACUTE ONLY): 29 min   Charges:   PT Evaluation $PT Eval Low Complexity: 1 Low PT Treatments $Therapeutic Activity: 8-22 mins PT General Charges $$ ACUTE PT VISIT: 1 Visit         Azhar Yogi, PT  Acute Rehab Dept [facility]) [phone]  01/17/2024   [facility] 01/17/2024, 1:04 PM

## 2024-01-17 NOTE — Consult Note (Signed)
 ORTHOPAEDIC CONSULTATION  REQUESTING PHYSICIAN: [name], MD  Time called na Time arrived na  Chief Complaint: Left patella fracture  HPI: Matthew [name] is a 78 y.o. male who has a very complex past medical history.  He suffered a fall on roughly 9/10 and was seen in the emergency room with a patella fracture.  He was given a brace and returned home however he was unable to thrive at home and suffered a subsequent fall.  He has since been hospitalized and is also currently undergoing workup for lung mass.  Complains of pain at the left leg difficulty with brace compliance  Past Medical History:  Diagnosis Date   Adenomatous polyps    Benign neoplasm of colon    Chronic kidney disease    stage 2   COPD (chronic obstructive pulmonary disease) (HCC)    Diabetes mellitus without complication (HCC)    External hemorrhoids    Hemorrhoids    Thyroid  cancer (HCC)    Past Surgical History:  Procedure Laterality Date   THYROIDECTOMY     Social History   Socioeconomic History   Marital status: Single    Spouse name: Not on file   Number of children: 4   Years of education: 8   Highest education level: Not on file  Occupational History    Comment: retired  Tobacco Use   Smoking status: Every Day    Current packs/day: 1.50    Types: Cigarettes   Smokeless tobacco: Never   Tobacco comments:    Counseling sheet given in exam room for smoking   Vaping Use   Vaping status: Unknown  Substance and Sexual Activity   Alcohol  use: Not Currently   Drug use: No   Sexual activity: Not on file  Other Topics Concern   Not on file  Social History Narrative   Lives alone   Caffeine- coffee 6 cups, 1/2 Pepsi maybe   Social Drivers of Corporate investment banker Strain: Not on file  Food Insecurity: No Food Insecurity (01/16/2024)   Hunger Vital Sign    Worried About Running Out of Food in the Last Year: Never true    Ran Out of Food in the Last Year: Never true   Transportation Needs: No Transportation Needs (01/16/2024)   PRAPARE - Administrator, Civil Service (Medical): No    Lack of Transportation (Non-Medical): No  Physical Activity: Not on file  Stress: Not on file  Social Connections: Socially Isolated (01/16/2024)   Social Connection and Isolation Panel    Frequency of Communication with Friends and Family: More than three times a week    Frequency of Social Gatherings with Friends and Family: Three times a week    Attends Religious Services: Never    Active Member of Clubs or Organizations: No    Attends Engineer, structural: Never    Marital Status: Divorced   Family History  Problem Relation Age of Onset   Heart attack Father    Hypertension Father    Breast cancer Mother    Hypertension Mother    Hypertension Brother    Heart attack Brother    Heart attack Sister    No Known Allergies Prior to Admission medications   Medication Sig Start Date End Date Taking? Authorizing Provider  acetaminophen  (TYLENOL ) 500 MG tablet Take 1,000 mg by mouth every 4 (four) hours as needed for mild pain (pain score 1-3) or moderate pain (pain score  4-6).   Yes [provider]  albuterol  (VENTOLIN  HFA) 108 (90 Base) MCG/ACT inhaler Inhale 2 puffs into the lungs every 4 (four) hours as needed for wheezing or shortness of breath. 08/19/23  Yes [provider]  furosemide (LASIX) 40 MG tablet Take 40 mg by mouth 2 (two) times daily. 01/01/24  Yes [provider]  ipratropium-albuterol  (DUONEB) 0.5-2.5 (3) MG/3ML SOLN Take 3 mLs by nebulization every 6 (six) hours as needed (wheezing/sob). 01/02/24  Yes [provider]  levothyroxine  (SYNTHROID ) 200 MCG tablet Take 200 mcg by mouth daily before breakfast. 09/01/18  Yes [provider]  lisinopril  (ZESTRIL ) 40 MG tablet Take 1 tablet (40 mg total) by mouth daily. Patient taking differently: Take 20 mg by mouth every evening. 10/24/18  Yes  Arminda Daved HERO, MD  metFORMIN  (GLUCOPHAGE -XR) 750 MG 24 hr tablet Take 750 mg by mouth in the morning and at bedtime.   Yes [provider]  metolazone (ZAROXOLYN) 2.5 MG tablet Take 2.5 mg by mouth 2 (two) times a week. Take 2.5 mg by mouth twice a day on Monday and Thursday 12/25/23  Yes [provider]  Morphine  Sulfate (MORPHINE  CONCENTRATE) 10 mg / 0.5 ml concentrated solution TAKE 5 MG (5MG =0.25ML) BY MOUTH EVERY 4 HOURS AS NEEDED 12/17/23  Yes [provider]  traMADol  (ULTRAM ) 50 MG tablet Take 1 tablet (50 mg total) by mouth every 6 (six) hours as needed. Patient taking differently: Take 50 mg by mouth in the morning and at bedtime. 01/14/24  Yes Raford Lenis, MD  atorvastatin  (LIPITOR) 40 MG tablet TAKE 1 TABLET BY MOUTH DAILY Patient not taking: Reported on 01/16/2024 05/07/23   Whitfield Raisin, NP  JARDIANCE 10 MG TABS tablet Take 10 mg by mouth daily. Patient not taking: Reported on 01/16/2024 11/18/18   [provider]  Semaglutide,0.25 or 0.5MG /DOS, (OZEMPIC, 0.25 OR 0.5 MG/DOSE,) 2 MG/1.5ML SOPN See admin instructions. Patient not taking: Reported on 01/16/2024 11/27/20   [provider]  SYMBICORT 160-4.5 MCG/ACT inhaler SMARTSIG:2 Puff(s) By Mouth Every 12 Hours Patient not taking: Reported on 01/16/2024 02/02/20   [provider]   CT Cervical Spine Wo Contrast Result Date: 01/16/2024 CLINICAL DATA:  78 year old male with recurrent falls. EXAM: CT CERVICAL SPINE WITHOUT CONTRAST TECHNIQUE: Multidetector CT imaging of the cervical spine was performed without intravenous contrast. Multiplanar CT image reconstructions were also generated. RADIATION DOSE REDUCTION: This exam was performed according to the departmental dose-optimization program which includes automated exposure control, adjustment of the mA and/or kV according to patient size and/or use of iterative reconstruction technique. COMPARISON:  Head CT today.  Cervical spine CT  01/14/2024. FINDINGS: Alignment: Stable reversal of cervical lordosis. Cervicothoracic junction alignment is within normal limits. Bilateral posterior element alignment is within normal limits. Skull base and vertebrae: Visualized skull base is intact. No atlanto-occipital dissociation. C1 and C2 appear intact and aligned. No acute osseous abnormality identified. Soft tissues and spinal canal: Calcified cervical carotid atherosclerosis. Otherwise negative visible noncontrast neck soft tissues. Disc levels: Chronic cervical spine degeneration, with developing degenerative facet ankylosis on the left at C3-C4. Chronic disc and endplate degeneration maximal at C5-C6. Upper chest: Visible upper thoracic levels appear intact. Generalized pulmonary septal thickening in the lung apices appears stable, nonspecific. And somewhat similar to 2021 neck CTA appearance. IMPRESSION: 1. No acute traumatic injury identified in the cervical spine. 2. Chronic cervical spine degeneration. 3. 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 Electronically Signed  By: VEAR Hurst M.D.   On: 01/16/2024 12:14   CT Head Wo Contrast Result Date: 01/16/2024 CLINICAL DATA:  78 year old male with recurrent falls. EXAM: CT HEAD WITHOUT CONTRAST TECHNIQUE: Contiguous axial images were obtained from the base of the skull through the vertex without intravenous contrast. RADIATION DOSE REDUCTION: This exam was performed according to the departmental dose-optimization program which includes automated exposure control, adjustment of the mA and/or kV according to patient size and/or use of iterative reconstruction technique. COMPARISON:  Brain MRI 12/05/2019.  Head CT 01/14/2024. FINDINGS: Brain: No midline shift, ventriculomegaly, mass effect, evidence of mass lesion, intracranial hemorrhage or evidence of cortically based acute infarction. Confluent bilateral cerebral white matter hypodensity is  stable. Vascular: Calcified atherosclerosis at the skull base. No suspicious intracranial vascular hyperdensity. Skull: Stable, no acute fracture identified. Nonspecific multiple small but circumscribed lucent areas at the skull vertex, but these appears stable since at least 2021 and benign. Sinuses/Orbits: Visualized paranasal sinuses and mastoids are stable and well aerated. Other: No acute orbit or scalp soft tissue injury identified. IMPRESSION: 1. No acute intracranial abnormality or acute traumatic injury identified. 2. Stable chronic white matter disease. Electronically Signed   By: VEAR Hurst M.D.   On: 01/16/2024 12:11   DG Knee Complete 4 Views Left Result Date: 01/16/2024 CLINICAL DATA:  Patellar fracture EXAM: LEFT KNEE - COMPLETE 4+ VIEW COMPARISON:  01/14/2024 FINDINGS: Stable transverse patellar fracture and knee effusion. Prepatellar soft tissue swelling. No new fracture identified. IMPRESSION: 1. Stable transverse patellar fracture and knee effusion. Prepatellar soft tissue swelling. Electronically Signed   By: Ryan Salvage M.D.   On: 01/16/2024 12:07   DG Femur Min 2 Views Left Result Date: 01/16/2024 CLINICAL DATA:  Fall, hip and knee pain EXAM: LEFT FEMUR 2 VIEWS COMPARISON:  None Available. FINDINGS: Atheromatous vascular calcifications. Knee effusion observed with a transverse patellar fracture. Cross-table lateral view of the proximal femur severely suboptimal related to patient mobility and body habitus issues. IMPRESSION: 1. Transverse patellar fracture with knee effusion. 2. Atheromatous vascular calcifications. 3. Severely suboptimal cross-table lateral view of the proximal femur related to patient mobility and body habitus issues. Electronically Signed   By: Ryan Salvage M.D.   On: 01/16/2024 12:02   DG Pelvis Portable Result Date: 01/16/2024 CLINICAL DATA:  Fall, left hip pain EXAM: PORTABLE PELVIS 1-2 VIEWS COMPARISON:  None Available. FINDINGS: Limited assessment due  to patient body habitus and difficulty positioning related to mobility and pain. In particular, there is poor definition of portions of the left iliac bone and left hemipelvis. The pelvis is mildly rotated to the left. No overt cortical discontinuity characteristic fracture is identified. IMPRESSION: 1. Limited assessment due to patient body habitus and difficulty positioning related to mobility and pain. In particular, there is poor definition of portions of the left iliac bone and left hemipelvis. No overt cortical discontinuity characteristic of fracture is identified. Electronically Signed   By: Ryan Salvage M.D.   On: 01/16/2024 12:00   DG Chest Portable 1 View Result Date: 01/16/2024 CLINICAL DATA:  Fall during transport to bedside commode. History of patellar fracture due to a prior fall. EXAM: PORTABLE CHEST 1 VIEW COMPARISON:  12/05/2019 FINDINGS: New masslike density projecting over the lingula measuring proximally 9.6 by 7.7 cm. Malignancy is not excluded and chest CT (with contrast if feasible) is recommended for further characterization. Emphysema noted. Mild enlargement of the cardiopericardial silhouette. Aortic atherosclerosis noted. Airway thickening is present, suggesting bronchitis or reactive airways disease.  IMPRESSION: 1. New masslike density projecting over the lingula measuring 9.6 x 7.7 cm. Malignancy such as lung cancer is not excluded and chest CT (with contrast if feasible) is recommended for further characterization. 2. Airway thickening is present, suggesting bronchitis or reactive airways disease. 3. Mild enlargement of the cardiopericardial silhouette. 4. Aortic Atherosclerosis (ICD10-I70.0) and Emphysema (ICD10-J43.9). Electronically Signed   By: Ryan Salvage M.D.   On: 01/16/2024 11:59    Positive ROS: All other systems have been reviewed and were otherwise negative with the exception of those mentioned in the HPI and as above.  Labs cbc Recent Labs     01/16/24 2336 01/17/24 0400  WBC 12.1* 11.2*  HGB 8.9* 9.1*  HCT 30.0* 29.5*  PLT 472* 449*    Labs inflam No results for input(s): CRP in the last 72 hours.  Invalid input(s): ESR  Labs coag Recent Labs    01/16/24 1715  INR 0.9    Recent Labs    01/16/24 1044 01/17/24 0625  NA 137 134*  K 3.5 3.7  CL 91* 90*  CO2 34* 29  GLUCOSE 138* 127*  BUN 34* 29*  CREATININE 1.51* 1.21  CALCIUM  7.5* 7.6*    Physical Exam: Vitals:   01/17/24 0810 01/17/24 0811  BP:    Pulse:    Resp:    Temp:    SpO2: 96% 96%   General: Alert, no acute distress Cardiovascular: No pedal edema Respiratory: No cyanosis, no use of accessory musculature GI: No organomegaly, abdomen is soft and non-tender Skin: No lesions in the area of chief complaint other than those listed below in MSK exam.  Neurologic: Sensation intact distally save for the below mentioned MSK exam Psychiatric: Patient is competent for consent with normal mood and affect Lymphatic: No axillary or cervical lymphadenopathy  MUSCULOSKELETAL:  The left lower extremity hematoma around the patella with swelling compartments are soft.   Other extremities are atraumatic with painless ROM and NVI.  Assessment: Left patella fracture comminuted but relatively small amount of displacement  Plan: I had a long talk with his family we discussed the risks of surgery including cardiovascular compromise and infection we also discussed possible pathway for nonoperative management  For now we are gena continue conservative management keeping his leg in extension we will reimage it later this week if he is showing interval displacement could consider ORIF  Protein supplementation given low albumin would be helpful for healing  CAT scan of the knee  I will order a Bledsoe locked in extension full-time  He can weight-bear as tolerated when locked in extension with physical therapy   Matthew JONETTA Chancy,  MD    01/17/2024 10:58 AM

## 2024-01-17 NOTE — Progress Notes (Signed)
 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  Patient seen in the ED few days prior to admission noted to have a fractured patella and sent home with outpatient follow-up with orthopedics.  Patient presented back as unable to tolerate his brace and unable to take care of himself and bear weight.  Also noted was concerns for possible melanotic stools and diarrhea.  Patient also with some concerns of some hemoptysis.  Chest x-ray done concerning for pulmonary mass.  FOBT noted to be positive.  CT chest done for further evaluation of pulmonary mass.  Orthopedics, GI, palliative care consulted.   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 - Imaging done consistent with nondisplaced left patella  fracture with effusion. - Patient noted to be unable to bear weight at home. - Patient seen in consultation by orthopedics, Dr. Beverley who has ordered a CT scan of the knee, for now recommending conservative management keeping his leg extended and in a knee immobilizer and orthopedics recommending reimaging it later on this week and if showing interval displacement may consider ORIF. - Bledsoe locked in extension ordered per orthopedics. - PT/OT. - WBAT when locked in extension with PT. - Per orthopedics.  2.  Lung mass -Noted on chest x-ray. - Patient has a history of hemoptysis, ongoing tobacco use, mass concerning for cancer. - CT chest ordered for further evaluation. - Patient noted to be on hospice, palliative care consulted for GOC conversation.  3.  Normocytic anemia/??UGIB -FOBT noted to be positive. Patient noted with hemoptysis and black stools, may be swallowing-hemoptysis. -Anemia panel with iron level of 20, TIBC of 302, ferritin of 71. - Hemoglobin currently stable at 9.1. - Advance to regular diet. - Follow H&H. - Continue IV PPI every 12 hours. - GI consulted and patient will be seen in the a.m. for further evaluation and management.  4.  CKD stage II - Patient noted with a creatinine of 1.5 on presentation. -Urinalysis leukocytes negative, nitrite negative, protein of 100. - Renal function improved creatinine 1.21. - Continue gentle hydration.  5.  End-stage COPD/chronic respiratory failure on 4 L nasal cannula -Patient noted to be on hospice for end-stage COPD. - Currently on baseline home O2. - Place on scheduled DuoNeb. - Patient with  ongoing tobacco. - Continue Breo Ellipta . - Place on Z-Pak due to concerns for possible bronchitis. - Morphine  as needed. - Palliative care consulted for goals of care.  6.  Controlled type 2 diabetes mellitus - Hemoglobin A1c 5.8 - Hold oral hypoglycemic agents. - Discontinue SSI. - Discontinue CBG.  7.   Hypertension -Continue to hold lisinopril , Lasix, metolazone. - As needed hydralazine .  8.  Chronic diastolic CHF -2D echo from 2021 with normal EF, grade 1 DD. - Patient not volume overloaded on examination. - Continue to hold Lasix and metolazone likely resume in the next 24 hours.. - Continue to lisinopril .  9.  Hypothyroidism status post 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 -TSH noted at 6.350. - T4 at 1.09. - Continue Synthroid .  10.  Hyperlipidemia - No longer on statin.  11.  Memory loss  - Patient with no formal history or diagnosis of dementia per family but had significant short-term memory loss. - Continue delirium precautions. - TOC consulted for placement.  12.  OSA -Patient noncompliant with CPAP. - Patient noted to have refused CPAP nightly.  13.  Tobacco use -Patient with no desire for tobacco cessation. - Continue nicotine  patch.  DVT prophylaxis: SCDs Code Status: DNR Family Communication: Updated patient, daughter, son-in-law at bedside. Disposition: TBD  Status is: Inpatient Remains inpatient appropriate because: Severity of illness   Consultants:  Orthopedics: Dr. Beverley 01/17/2023 Palliative care: Dr. Clayton 01/17/2024   Procedures:  Chest x-ray 01/16/2024 Painful to the left femur 01/16/2024 Plain films of the left knee 01/16/2024 Plain films of the pelvis 01/16/2024 CT chest 01/17/2024   Antimicrobials:  Anti-infectives (From admission, onward)    Start     Dose/Rate Route Frequency Ordered Stop   01/18/24 1000  azithromycin  (ZITHROMAX ) tablet 250 mg       Placed in Followed by Linked Group   250 mg Oral Daily 01/17/24 0838 01/22/24 0959   01/17/24 1000  azithromycin  (ZITHROMAX ) tablet 500 mg       Placed in Followed by Linked Group   500 mg Oral Daily 01/17/24 0838 01/17/24 0934         Subjective: Patient sitting up in recliner.  Noted to have just had a brown bowel movement per RN.  Patient denies any chest pain.  No significant  shortness of breath.  Some complaints of left knee pain.  Left lower extremity in immobilizer.  Daughter and son-in-law at bedside.  Objective: Vitals:   01/17/24 0639 01/17/24 0810 01/17/24 0811 01/17/24 1424  BP: (!) 150/75   (!) 150/72  Pulse: 95   (!) 102  Resp: (!) 24     Temp: 98 F (36.7 C)   97.8 F (36.6 C)  TempSrc:    Oral  SpO2: 90% 96% 96% 90%  Weight:      Height:        Intake/Output Summary (Last 24 hours) at 01/17/2024 1540 Last data filed at 01/17/2024 0600 Gross per 24 hour  Intake 634.57 ml  Output 620 ml  Net 14.57 ml   Filed Weights   01/16/24 1032 01/16/24 2001  Weight: 98.9 kg 105.5 kg    Examination:  General exam: Appears calm and comfortable  Respiratory system: Some decreased breath sounds in the bases.  No significant wheezing.  Fair air movement.  Speaking in full sentences.  Respiratory effort normal. Cardiovascular system: S1 & S2 heard, RRR. No JVD, murmurs, rubs, gallops or clicks. No pedal edema. Gastrointestinal system: Abdomen is nondistended, soft obese, and nontender.  No organomegaly or masses felt. Normal bowel sounds heard. Central nervous system: Alert and oriented.  Moving extremities spontaneously.  No focal neurological deficits. Extremities: Left lower extremity in knee immobilizer.  Symmetric 5 x 5 power. Skin: No rashes, lesions or ulcers Psychiatry: Judgement and insight appear normal. Mood & affect appropriate.     Data Reviewed: I have personally reviewed following labs and imaging studies  CBC: Recent Labs  Lab 01/16/24 1044 01/16/24 1715 01/16/24 2336 01/17/24 0400  WBC 10.9* 11.7* 12.1* 11.2*  NEUTROABS 8.8*  --   --   --   HGB 9.5* 9.2* 8.9* 9.1*  HCT 31.6* 31.0* 30.0* 29.5*  MCV 94.9 96.0 95.2 95.5  PLT 471* 484* 472* 449*    Basic Metabolic Panel: Recent Labs  Lab 01/16/24 1044 01/17/24 0625  NA 137 134*  K 3.5 3.7  CL 91* 90*  CO2 34* 29  GLUCOSE 138* 127*  BUN 34* 29*  CREATININE 1.51* 1.21   CALCIUM  7.5* 7.6*    GFR: Estimated Creatinine Clearance: 62.2 mL/min (by C-G formula based on SCr of 1.21 mg/dL).  Liver Function Tests: Recent Labs  Lab 01/16/24 1044  AST 25  ALT 16  ALKPHOS 110  BILITOT 0.2  PROT 6.5  ALBUMIN 3.1*    CBG: Recent Labs  Lab 01/16/24 1713 01/16/24 2002 01/17/24 0733 01/17/24 1308  GLUCAP 133* 167* 135* 168*     No results found for this or any previous visit (from the past 240 hours).       Radiology Studies: CT CHEST W CONTRAST Result Date: 01/17/2024 CLINICAL DATA:  Left lung mass on radiography, for further imaging workup EXAM: CT CHEST WITH CONTRAST TECHNIQUE: Multidetector CT imaging of the chest was performed during intravenous contrast administration. RADIATION DOSE REDUCTION: This exam was performed according to the departmental dose-optimization program which includes automated exposure control, adjustment of the mA and/or kV according to patient size and/or use of iterative reconstruction technique. CONTRAST:  75mL OMNIPAQUE  IOHEXOL  300 MG/ML  SOLN COMPARISON:  01/16/2024 FINDINGS: Cardiovascular: Coronary, aortic arch, and branch vessel atherosclerotic vascular disease. Mild cardiomegaly. Mediastinum/Nodes: Left hilar node 1.1 cm in short axis, image 20 series 3. Left infrahilar node 1.2 cm in short axis, image 27 series 3. Right and left paratracheal lymph nodes are not pathologically enlarged by size criteria. Lungs/Pleura: 8.3 by 6.5 by 7.2 cm left upper lobe mass partially abutting and mildly displacing the minor fissure and also the lateral and cardiac pleural margins. This mass is associated with truncation and obstruction of the anterior segmental bronchus of the left upper lobe. Adjacent postobstructive atelectasis or pneumonitis. The top differential diagnostic consideration is primary lung malignancy. Emphysema noted. Dependent atelectasis or scarring in both lower lobes with a bulla in the posterior basal segment right  lower lobe. Airway thickening is present, suggesting bronchitis or reactive airways disease. Mild secondary pulmonary lobular interstitial accentuation in the lung apices, this can be encountered in the setting of mild interstitial edema but is technically nonspecific. Upper Abdomen: Fullness of both adrenal glands without a well-defined adrenal mass. Mild lobularity of the liver, nonspecific. Suspected gallstone in the neck of the gallbladder 2.8 cm diameter. Musculoskeletal: Thoracic spondylosis. IMPRESSION: 1. 8.3 by 6.5 by 7.2 cm left upper lobe mass partially abutting and mildly displacing the minor fissure and also the lateral and cardiac pleural margins. This mass is associated with truncation and obstruction of the anterior segmental bronchus of the left upper lobe. Adjacent postobstructive atelectasis or pneumonitis. The top  differential diagnostic consideration is primary lung malignancy. Pulmonary and/or oncology consultation recommended. 2. Mildly enlarged left hilar and left infrahilar lymph nodes. 3. Fullness of both adrenal glands without a well-defined adrenal mass. 4. Mild lobularity of the liver, nonspecific. 5. Suspected gallstone in the neck of the gallbladder 2.8 cm diameter. 6. Airway thickening is present, suggesting bronchitis or reactive airways disease. 7. Mild secondary pulmonary lobular interstitial accentuation in the lung apices, this can be encountered in the setting of mild interstitial edema but is technically nonspecific. 8.  Aortic Atherosclerosis (ICD10-I70.0). Electronically Signed   By: Ryan Salvage M.D.   On: 01/17/2024 15:33   CT Cervical Spine Wo Contrast Result Date: 01/16/2024 CLINICAL DATA:  78 year old male with recurrent falls. EXAM: CT CERVICAL SPINE WITHOUT CONTRAST TECHNIQUE: Multidetector CT imaging of the cervical spine was performed without intravenous contrast. Multiplanar CT image reconstructions were also generated. RADIATION DOSE REDUCTION: This exam  was performed according to the departmental dose-optimization program which includes automated exposure control, adjustment of the mA and/or kV according to patient size and/or use of iterative reconstruction technique. COMPARISON:  Head CT today.  Cervical spine CT 01/14/2024. FINDINGS: Alignment: Stable reversal of cervical lordosis. Cervicothoracic junction alignment is within normal limits. Bilateral posterior element alignment is within normal limits. Skull base and vertebrae: Visualized skull base is intact. No atlanto-occipital dissociation. C1 and C2 appear intact and aligned. No acute osseous abnormality identified. Soft tissues and spinal canal: Calcified cervical carotid atherosclerosis. Otherwise negative visible noncontrast neck soft tissues. Disc levels: Chronic cervical spine degeneration, with developing degenerative facet ankylosis on the left at C3-C4. Chronic disc and endplate degeneration maximal at C5-C6. Upper chest: Visible upper thoracic levels appear intact. Generalized pulmonary septal thickening in the lung apices appears stable, nonspecific. And somewhat similar to 2021 neck CTA appearance. IMPRESSION: 1. No acute traumatic injury identified in the cervical spine. 2. Chronic cervical spine degeneration. 3. 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 Electronically Signed   By: VEAR Hurst M.D.   On: 01/16/2024 12:14   CT Head Wo Contrast Result Date: 01/16/2024 CLINICAL DATA:  78 year old male with recurrent falls. EXAM: CT HEAD WITHOUT CONTRAST TECHNIQUE: Contiguous axial images were obtained from the base of the skull through the vertex without intravenous contrast. RADIATION DOSE REDUCTION: This exam was performed according to the departmental dose-optimization program which includes automated exposure control, adjustment of the mA and/or kV according to patient size and/or use of iterative reconstruction technique. COMPARISON:   Brain MRI 12/05/2019.  Head CT 01/14/2024. FINDINGS: Brain: No midline shift, ventriculomegaly, mass effect, evidence of mass lesion, intracranial hemorrhage or evidence of cortically based acute infarction. Confluent bilateral cerebral white matter hypodensity is stable. Vascular: Calcified atherosclerosis at the skull base. No suspicious intracranial vascular hyperdensity. Skull: Stable, no acute fracture identified. Nonspecific multiple small but circumscribed lucent areas at the skull vertex, but these appears stable since at least 2021 and benign. Sinuses/Orbits: Visualized paranasal sinuses and mastoids are stable and well aerated. Other: No acute orbit or scalp soft tissue injury identified. IMPRESSION: 1. No acute intracranial abnormality or acute traumatic injury identified. 2. Stable chronic white matter disease. Electronically Signed   By: VEAR Hurst M.D.   On: 01/16/2024 12:11   DG Knee Complete 4 Views Left Result Date: 01/16/2024 CLINICAL DATA:  Patellar fracture EXAM: LEFT KNEE - COMPLETE 4+ VIEW COMPARISON:  01/14/2024 FINDINGS: Stable transverse patellar fracture and knee effusion. Prepatellar soft tissue swelling. No new fracture identified.  IMPRESSION: 1. Stable transverse patellar fracture and knee effusion. Prepatellar soft tissue swelling. Electronically Signed   By: Ryan Salvage M.D.   On: 01/16/2024 12:07   DG Femur Min 2 Views Left Result Date: 01/16/2024 CLINICAL DATA:  Fall, hip and knee pain EXAM: LEFT FEMUR 2 VIEWS COMPARISON:  None Available. FINDINGS: Atheromatous vascular calcifications. Knee effusion observed with a transverse patellar fracture. Cross-table lateral view of the proximal femur severely suboptimal related to patient mobility and body habitus issues. IMPRESSION: 1. Transverse patellar fracture with knee effusion. 2. Atheromatous vascular calcifications. 3. Severely suboptimal cross-table lateral view of the proximal femur related to patient mobility and body  habitus issues. Electronically Signed   By: Ryan Salvage M.D.   On: 01/16/2024 12:02   DG Pelvis Portable Result Date: 01/16/2024 CLINICAL DATA:  Fall, left hip pain EXAM: PORTABLE PELVIS 1-2 VIEWS COMPARISON:  None Available. FINDINGS: Limited assessment due to patient body habitus and difficulty positioning related to mobility and pain. In particular, there is poor definition of portions of the left iliac bone and left hemipelvis. The pelvis is mildly rotated to the left. No overt cortical discontinuity characteristic fracture is identified. IMPRESSION: 1. Limited assessment due to patient body habitus and difficulty positioning related to mobility and pain. In particular, there is poor definition of portions of the left iliac bone and left hemipelvis. No overt cortical discontinuity characteristic of fracture is identified. Electronically Signed   By: Ryan Salvage M.D.   On: 01/16/2024 12:00   DG Chest Portable 1 View Result Date: 01/16/2024 CLINICAL DATA:  Fall during transport to bedside commode. History of patellar fracture due to a prior fall. EXAM: PORTABLE CHEST 1 VIEW COMPARISON:  12/05/2019 FINDINGS: New masslike density projecting over the lingula measuring proximally 9.6 by 7.7 cm. Malignancy is not excluded and chest CT (with contrast if feasible) is recommended for further characterization. Emphysema noted. Mild enlargement of the cardiopericardial silhouette. Aortic atherosclerosis noted. Airway thickening is present, suggesting bronchitis or reactive airways disease. IMPRESSION: 1. New masslike density projecting over the lingula measuring 9.6 x 7.7 cm. Malignancy such as lung cancer is not excluded and chest CT (with contrast if feasible) is recommended for further characterization. 2. Airway thickening is present, suggesting bronchitis or reactive airways disease. 3. Mild enlargement of the cardiopericardial silhouette. 4. Aortic Atherosclerosis (ICD10-I70.0) and Emphysema  (ICD10-J43.9). Electronically Signed   By: Ryan Salvage M.D.   On: 01/16/2024 11:59        Scheduled Meds:  [START ON 01/18/2024] azithromycin   250 mg Oral Daily   bacitracin    Topical BID   fluticasone  furoate-vilanterol  1 puff Inhalation Daily   ipratropium-albuterol   3 mL Nebulization TID   levothyroxine   200 mcg Oral QAC breakfast   pantoprazole  (PROTONIX ) IV  40 mg Intravenous Q12H   senna-docusate  1 tablet Oral BID   traMADol   50 mg Oral Q12H   Continuous Infusions:  sodium chloride  75 mL/hr at 01/17/24 0844     LOS: 1 day    Time spent: 40 minutes    Toribio Hummer, MD [facility]   To contact the attending provider between 7A-7P or the covering provider during after hours 7P-7A, please log into the web site [email or web address] and access using universal [place] password for that web site. If you do not have the password, please call the hospital operator.  01/17/2024, 3:40 PM

## 2024-01-17 NOTE — Progress Notes (Signed)
 Orthopedic Tech Progress Note Patient Details:  Matthew [name] 09/04/1945 [id number] Bledsoe knee brace has been ordered from [facility]  Patient ID: [name], male   DOB: 1945-11-09, 78 y.o.   MRN: [id number]  Massie FORBES Bar 01/17/2024, 11:25 AM

## 2024-01-18 ENCOUNTER — Inpatient Hospital Stay (HOSPITAL_COMMUNITY)

## 2024-01-18 DIAGNOSIS — S82002K Unspecified fracture of left patella, subsequent encounter for closed fracture with nonunion: Secondary | ICD-10-CM

## 2024-01-18 DIAGNOSIS — R918 Other nonspecific abnormal finding of lung field: Secondary | ICD-10-CM

## 2024-01-18 DIAGNOSIS — E785 Hyperlipidemia, unspecified: Secondary | ICD-10-CM | POA: Diagnosis not present

## 2024-01-18 DIAGNOSIS — D500 Iron deficiency anemia secondary to blood loss (chronic): Secondary | ICD-10-CM | POA: Diagnosis not present

## 2024-01-18 DIAGNOSIS — Z720 Tobacco use: Secondary | ICD-10-CM

## 2024-01-18 DIAGNOSIS — S82002D Unspecified fracture of left patella, subsequent encounter for closed fracture with routine healing: Secondary | ICD-10-CM | POA: Diagnosis not present

## 2024-01-18 DIAGNOSIS — Z7189 Other specified counseling: Secondary | ICD-10-CM

## 2024-01-18 DIAGNOSIS — R531 Weakness: Secondary | ICD-10-CM

## 2024-01-18 DIAGNOSIS — Z515 Encounter for palliative care: Secondary | ICD-10-CM

## 2024-01-18 DIAGNOSIS — Z800 Family history of malignant neoplasm of digestive organs: Secondary | ICD-10-CM

## 2024-01-18 DIAGNOSIS — K922 Gastrointestinal hemorrhage, unspecified: Secondary | ICD-10-CM | POA: Diagnosis not present

## 2024-01-18 DIAGNOSIS — J449 Chronic obstructive pulmonary disease, unspecified: Secondary | ICD-10-CM | POA: Diagnosis not present

## 2024-01-18 LAB — CBC WITH DIFFERENTIAL/PLATELET
Abs Immature Granulocytes: 0.08 K/uL — ABNORMAL HIGH (ref 0.00–0.07)
Basophils Absolute: 0.1 K/uL (ref 0.0–0.1)
Basophils Relative: 1 %
Eosinophils Absolute: 0 K/uL (ref 0.0–0.5)
Eosinophils Relative: 0 %
HCT: 30.9 % — ABNORMAL LOW (ref 39.0–52.0)
Hemoglobin: 9.4 g/dL — ABNORMAL LOW (ref 13.0–17.0)
Immature Granulocytes: 1 %
Lymphocytes Relative: 12 %
Lymphs Abs: 1.3 K/uL (ref 0.7–4.0)
MCH: 28.8 pg (ref 26.0–34.0)
MCHC: 30.4 g/dL (ref 30.0–36.0)
MCV: 94.8 fL (ref 80.0–100.0)
Monocytes Absolute: 1 K/uL (ref 0.1–1.0)
Monocytes Relative: 9 %
Neutro Abs: 8.9 K/uL — ABNORMAL HIGH (ref 1.7–7.7)
Neutrophils Relative %: 77 %
Platelets: 458 K/uL — ABNORMAL HIGH (ref 150–400)
RBC: 3.26 MIL/uL — ABNORMAL LOW (ref 4.22–5.81)
RDW: 16.3 % — ABNORMAL HIGH (ref 11.5–15.5)
WBC: 11.3 K/uL — ABNORMAL HIGH (ref 4.0–10.5)
nRBC: 0 % (ref 0.0–0.2)

## 2024-01-18 LAB — BASIC METABOLIC PANEL WITH GFR
Anion gap: 11 (ref 5–15)
BUN: 28 mg/dL — ABNORMAL HIGH (ref 8–23)
CO2: 31 mmol/L (ref 22–32)
Calcium: 7.8 mg/dL — ABNORMAL LOW (ref 8.9–10.3)
Chloride: 91 mmol/L — ABNORMAL LOW (ref 98–111)
Creatinine, Ser: 1.04 mg/dL (ref 0.61–1.24)
GFR, Estimated: >60 mL/min (ref >60)
Glucose, Bld: 134 mg/dL — ABNORMAL HIGH (ref 70–99)
Potassium: 3.6 mmol/L (ref 3.5–5.1)
Sodium: 133 mmol/L — ABNORMAL LOW (ref 135–145)

## 2024-01-18 MED ORDER — OXYCODONE HCL 5 MG PO TABS
5.0000 mg | ORAL_TABLET | ORAL | Status: DC | PRN
  Administered 2024-01-18 – 2024-01-20 (×5): 5 mg via ORAL
  Filled 2024-01-18 (×5): qty 1

## 2024-01-18 MED ORDER — MORPHINE SULFATE (PF) 4 MG/ML IV SOLN
3.0000 mg | INTRAVENOUS | Status: DC | PRN
  Administered 2024-01-18 – 2024-01-20 (×14): 3 mg via INTRAVENOUS
  Filled 2024-01-18 (×14): qty 1

## 2024-01-18 MED ORDER — IPRATROPIUM-ALBUTEROL 0.5-2.5 (3) MG/3ML IN SOLN
3.0000 mL | Freq: Three times a day (TID) | RESPIRATORY_TRACT | Status: DC
  Administered 2024-01-18 – 2024-01-19 (×6): 3 mL via RESPIRATORY_TRACT
  Filled 2024-01-18 (×6): qty 3

## 2024-01-18 MED ORDER — AMLODIPINE BESYLATE 10 MG PO TABS
5.0000 mg | ORAL_TABLET | Freq: Every day | ORAL | Status: DC
  Administered 2024-01-18 – 2024-01-19 (×2): 5 mg via ORAL
  Filled 2024-01-18 (×2): qty 1

## 2024-01-18 NOTE — Progress Notes (Signed)
 Daily Progress Note   Patient Name: Matthew [name]       Date: 01/18/2024 DOB: August 11, 1945  Age: 78 y.o. MRN#: [id number] Attending Physician: [name], MD Primary Care Physician: [name], MD Admit Date: 01/16/2024  Reason for Consultation/Follow-up: Establishing goals of care  Subjective: Resting in bed, appears restless, appears uncomfortable.  States that he wants to be repositioned in bed so as to be able to breathe better.  Family member present at bedside asking about his current medical condition and scope of current hospitalization.  Discussions held within the room as well as outside the room-see below.  Length of Stay: 2  Current Medications: Scheduled Meds:   amLODipine   5 mg Oral Daily   azithromycin   250 mg Oral Daily   bacitracin    Topical BID   fluticasone  furoate-vilanterol  1 puff Inhalation Daily   ipratropium-albuterol   3 mL Nebulization TID   levothyroxine   200 mcg Oral QAC breakfast   nicotine   14 mg Transdermal Daily   pantoprazole  (PROTONIX ) IV  40 mg Intravenous Q12H   senna-docusate  1 tablet Oral BID    Continuous Infusions:   PRN Meds: acetaminophen  **OR** acetaminophen , antiseptic oral rinse, artificial tears, glycopyrrolate  **OR** glycopyrrolate  **OR** glycopyrrolate , haloperidol  **OR** haloperidol  **OR** haloperidol  lactate, hydrALAZINE , ipratropium-albuterol , morphine  injection, ondansetron  **OR** ondansetron  (ZOFRAN ) IV, oxyCODONE   Physical Exam         Elderly appearing gentleman resting in bed Awake alert answers a few questions Appears tachycardic Appears to have increased work of breathing Is on supplemental oxygen  via nasal cannula Chronically ill-appearing Appears restless Abdomen is obese but soft  Vital Signs: BP  (!) 159/69 (BP Location: Right Arm)   Pulse 99   Temp 97.6 F (36.4 C) (Oral)   Resp 16   Ht 5' 10 (1.778 m)   Wt 105.5 kg   SpO2 94%   BMI 33.37 kg/m  SpO2: SpO2: 94 % O2 Device: O2 Device: Nasal Cannula O2 Flow Rate: O2 Flow Rate (L/min): 4 L/min  Intake/output summary:  Intake/Output Summary (Last 24 hours) at 01/18/2024 1016 Last data filed at 01/18/2024 0446 Gross per 24 hour  Intake 727.58 ml  Output 1000 ml  Net -272.42 ml   LBM: Last BM Date : 01/17/24 Baseline Weight: Weight: 98.9 kg Most recent weight: Weight: 105.5 kg  Palliative Assessment/Data:      Patient Active Problem List   Diagnosis Date Noted   Palliative care encounter 01/17/2024   ACP (advance care planning) 01/17/2024   DNR (do not resuscitate) 01/17/2024   Shortness of breath 01/17/2024   Pain 01/17/2024   Medication management 01/17/2024   High risk medication use 01/17/2024   Essential hypertension 01/16/2024   Memory loss 01/16/2024   left transverse patellar fracture 01/16/2024   Lung mass 01/16/2024   Normocytic anemia 01/16/2024   OSA (obstructive sleep apnea) 01/16/2024   Upper GI bleed 01/16/2024   Chronic diastolic CHF (congestive heart failure) (HCC) 01/16/2024   Body mass index (BMI) 34.0-34.9, adult 09/24/2021   History of thyroidectomy 09/24/2021   Personal history of malignant neoplasm of thyroid  09/24/2021   Type 2 diabetes mellitus with hyperglycemia (HCC) 09/24/2021   Poor sleep hygiene 06/19/2020   Panlobular emphysema (HCC) 06/19/2020   Tobacco use 06/19/2020   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) 06/19/2020   At risk for sleep apnea 06/19/2020   OSA and COPD overlap syndrome (HCC) 06/19/2020   Chronic intermittent hypoxia with obstructive sleep apnea 06/19/2020   Hypertensive emergency 12/07/2019   Hyperlipidemia 12/07/2019   Controlled type 2 diabetes mellitus without complication, without long-term current use of insulin  (HCC)  12/07/2019   Cognitive decline 12/07/2019   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 [place] 12/07/2019   CKD (chronic kidney disease), stage II 12/07/2019   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 12/07/2019   Polycythemia secondary to smoking 12/07/2019   ICH (intracerebral hemorrhage) (HCC) - L parietal hypertensive hmg 12/05/2019   Encephalopathy acute 10/19/2018    Palliative Care Assessment & Plan   Patient Profile: 78 year old gentleman with history of cognitive impairment diabetes hypertension hyperlipidemia end-stage COPD receiving [facility] hospice support at home, stage II chronic kidney disease history of thyroid  cancer OSA not on CPAP history of ICH due to hypertension, history of chronic respiratory failure on supplemental oxygen  via nasal cannula Home hospice patient Admitted after a fall Found to have a left transverse patellar fracture Possible concern for upper GI bleed Found to have newly diagnosed lung mass on imaging  Assessment: Functional decline Shortness of breath Hospice patient   Recommendations/Plan: Initial palliative care consult note reviewed, agree with DNR/comfort measures.  Patient already followed by [facility] hospice Medication history reviewed, continue the same Recommend continued hospice support in the outpatient setting.  Scope of current hospitalization, imaging pertinent to this hospitalization, frank and compassionate discussion regarding patient's serious life limiting illness of end-stage COPD as well as newly diagnosed lung mass discussed with the family member present at bedside all of her questions answered to the best of my ability. Code Status:    Code Status Orders  (From admission, onward)           Start     Ordered   01/17/24 1607  Do not attempt resuscitation (DNR) - Comfort care  Continuous       Question Answer Comment  If patient has no pulse and is not breathing Do Not Attempt Resuscitation   In Pre-Arrest  Conditions (Patient Is Breathing and Has a Pulse) Provide comfort measures. Relieve any mechanical airway obstruction. Avoid transfer unless required for comfort.   Consent: Discussion documented in EHR or advanced directives reviewed      01/17/24 1606           Code Status History     Date Active Date Inactive Code Status  Order ID Comments User Context   01/16/2024 1828 01/17/2024 1606 Limited: Do not attempt resuscitation (DNR) -DNR-LIMITED -Do Not Intubate/DNI  [id number]  Waddell Rake, MD ED   12/05/2019 1732 12/07/2019 1738 Full Code [id number]  Michaela Aisha SQUIBB, MD ED   10/19/2018 1841 10/24/2018 1731 Full Code [id number]  Loreta Levels, MD ED      Advance Directive Documentation    Flowsheet Row Most Recent Value  Type of Advance Directive Living will  Pre-existing out of facility DNR order (yellow form or pink MOST form) --  MOST Form in Place? --    Prognosis:  < 3 months  Discharge Planning: Home with Hospice  Care plan was discussed with patient, various members of the interdisciplinary discussed with the granddaughter present at bedside in detail.  Thank you for allowing the Palliative Medicine Team to assist in the care of this patient.high MDM     Greater than 50%  of this time was spent counseling and coordinating care related to the above assessment and plan.  Lonia Serve, MD  Please contact Palliative Medicine Team phone at [phone] for questions and concerns.

## 2024-01-18 NOTE — TOC Initial Note (Signed)
 Transition of Care [facility]) - Initial/Assessment Note    Patient Details  Name: Matthew [name] MRN: [id number] Date of Birth: Jun 16, 1945  [facility]) CM/SW Contact:    Tawni CHRISTELLA Eva, LCSW Phone Number: 01/18/2024, 12:16 PM  Clinical Narrative:                  Pt from home, [facility] following for Hospice services. IP Care management to follow for d/c need.  Expected Discharge Plan: Home w Hospice Care Barriers to Discharge: Continued Medical Work up   Patient Goals and CMS Choice            Expected Discharge Plan and Services                                              Prior Living Arrangements/Services                       Activities of Daily Living   ADL Screening (condition at time of admission) Independently performs ADLs?: Yes (appropriate for developmental age) Is the patient deaf or have difficulty hearing?: No Does the patient have difficulty seeing, even when wearing glasses/contacts?: No Does the patient have difficulty concentrating, remembering, or making decisions?: No  Permission Sought/Granted                  Emotional Assessment              Admission diagnosis:  Patellar fracture [S82.009A] Patient Active Problem List   Diagnosis Date Noted   Palliative care encounter 01/17/2024   ACP (advance care planning) 01/17/2024   DNR (do not resuscitate) 01/17/2024   Shortness of breath 01/17/2024   Pain 01/17/2024   Medication management 01/17/2024   High risk medication use 01/17/2024   Essential hypertension 01/16/2024   Memory loss 01/16/2024   left transverse patellar fracture 01/16/2024   Lung mass 01/16/2024   Normocytic anemia 01/16/2024   OSA (obstructive sleep apnea) 01/16/2024   Upper GI bleed 01/16/2024   Chronic diastolic CHF (congestive heart failure) (HCC) 01/16/2024   Body mass index (BMI) 34.0-34.9, adult 09/24/2021   History of thyroidectomy 09/24/2021   Personal history of  malignant neoplasm of thyroid  09/24/2021   Type 2 diabetes mellitus with hyperglycemia (HCC) 09/24/2021   Poor sleep hygiene 06/19/2020   Panlobular emphysema (HCC) 06/19/2020   Tobacco use 06/19/2020   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) 06/19/2020   At risk for sleep apnea 06/19/2020   OSA and COPD overlap syndrome (HCC) 06/19/2020   Chronic intermittent hypoxia with obstructive sleep apnea 06/19/2020   Hypertensive emergency 12/07/2019   Hyperlipidemia 12/07/2019   Controlled type 2 diabetes mellitus without complication, without long-term current use of insulin  (HCC) 12/07/2019   Cognitive decline 12/07/2019   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 ([address]), [place] - [address] [place] ([place]) [place] [address] Phone: [phone] Fax: [phone]  [facility]) - [place],  [place] - [address] [facility] [address] [place] [address] [place] [address] Phone: [phone] Fax: [phone]  [facility] - [place], [place] - [address] [place] [address] Phone: [phone] Fax: [phone]  [facility] - [place], [place] - [address] [place] [address] Phone: [phone] Fax: [phone]     Social Drivers of Health (SDOH) Social History: SDOH Screenings   Food Insecurity: No Food Insecurity (01/16/2024)  Housing: Low Risk  (01/16/2024)  Transportation Needs: No Transportation Needs (01/16/2024)  Utilities: Not At Risk  (01/16/2024)  Social Connections: Socially Isolated (01/16/2024)  Tobacco Use: High Risk (01/16/2024)   SDOH Interventions:     Readmission Risk Interventions     No data to display

## 2024-01-18 NOTE — Progress Notes (Addendum)
       Overnight   NAME: Matthew [name] MRN: [id number] DOB : [birth year]    Date of Service   01/18/2024   HPI/Events of Note    Notified by RN for potential dislocation of left shoulder. No fall.  Brief history 77 year old history of type 2 diabetes, hypertension, hyperlipidemia, end-stage COPD/chronic respiratory failure on home O2, CKD stage II, history of thyroid  cancer, OSA noncompliant with CPAP, history of ICH due to hypertension.  Bedside visit Arrived in room, patient sitting on the foot of the bed, nursing staff in her room. Patient clearly had misalignment of the left shoulder, limited mobility, discomfort and with limited range of motion.  Patient does have adequate capillary refill, and strength, and can move arm limited range. Pain is currently controlled with already prescribed as needed medications. Patient is comfort care, with hospice.  Discussed with Attending, will provide sling and appropriate positioning until a.m. rounds. Patient is currently being seen by orthopedics for fracture related to a fall.   Interventions/ Plan   Sling as tolerated  Maintain bed rest Pain control as previously ordered. Fall arresting pads  Bed alarm      Lynwood Kipper BSN MSNA MSN ACNPC-AG Acute Care Nurse Practitioner [facility]

## 2024-01-18 NOTE — Progress Notes (Signed)
 Pt refused to apply sling on his left shoulder. Pt said he is more comfortable without it. NP Blondie made aware. Will continue to monitor pt.

## 2024-01-18 NOTE — Progress Notes (Signed)
 Matthew [name] [facility] [facility] Patient   [name] is a current hospice patient followed at home for terminal diagnosis of Emphysema.  Patient experienced an unwitnessed fall at his home and was brought to [facility] ED for further evaluation.  Patient was admitted on 9.13.25 with diagnosis of Left Patella fracture and upper GI bleed.  Per Dr. Odella Pepper, hospice physician, this is a related hospitalization.   Visited patient at bedside where nurse and tech were repositioning him in bed. Patient continues to complain of pain "all over" and requested assistance with drinking his water. No family at bedside. Report exchanged with bedside nurse. Overnight incident occurred where patient dislocated his shoulder not due to a fall. Patient refusing shoulder sling. Per staff, patient continues to be restless.   Patient is appropriate for GIP level of care requiring IV medications to manage pain in left patella due to fracture after a fall.   Vital Signs: 97.7/99/16    727.6/1,000    94% on 4 lpm Zeb    Abnormal Labs: 01/18/24  Sodium: 133 (L) Chloride: 91 (L) Glucose: 134 (H) BUN: 28 (H) Calcium : 7.8 (L) WBC: 11.3 (H) RBC: 3.26 (L) Hemoglobin: 9.4 (L) HCT: 30.9 (L) RDW: 16.3 (H) Platelets: 458 (H) Abs Immature Granulocytes: 0.08 (H)  IV Meds/PRN: Protonix  40mg  IV BID Morphine  3mg  IV every 3 hours prn x6 Oxycodone  5mg  PO x2 Haldol  0.5mg  IV x2 Tylenol  650mg  x2  Diagnostics: EXAM: CT CHEST WITH CONTRAST   TECHNIQUE: Multidetector CT imaging of the chest was performed during intravenous contrast administration.   RADIATION DOSE REDUCTION: This exam was performed according to the departmental dose-optimization program which includes automated exposure control, adjustment of the mA and/or kV according to patient size and/or use of iterative reconstruction technique.   CONTRAST:  75mL OMNIPAQUE  IOHEXOL  300 MG/ML  SOLN   COMPARISON:  01/16/2024    FINDINGS: Cardiovascular: Coronary, aortic arch, and branch vessel atherosclerotic vascular disease. Mild cardiomegaly.   Mediastinum/Nodes: Left hilar node 1.1 cm in short axis, image 20 series 3. Left infrahilar node 1.2 cm in short axis, image 27 series 3.   Right and left paratracheal lymph nodes are not pathologically enlarged by size criteria.   Lungs/Pleura: 8.3 by 6.5 by 7.2 cm left upper lobe mass partially abutting and mildly displacing the minor fissure and also the lateral and cardiac pleural margins. This mass is associated with truncation and obstruction of the anterior segmental bronchus of the left upper lobe. Adjacent postobstructive atelectasis or pneumonitis. The top differential diagnostic consideration is primary lung malignancy.   Emphysema noted. Dependent atelectasis or scarring in both lower lobes with a bulla in the posterior basal segment right lower lobe.   Airway thickening is present, suggesting bronchitis or reactive airways disease.   Mild secondary pulmonary lobular interstitial accentuation in the lung apices, this can be encountered in the setting of mild interstitial edema but is technically nonspecific.   Upper Abdomen: Fullness of both adrenal glands without a well-defined adrenal mass. Mild lobularity of the liver, nonspecific. Suspected gallstone in the neck of the gallbladder 2.8 cm diameter.   Musculoskeletal: Thoracic spondylosis.   IMPRESSION: 1. 8.3 by 6.5 by 7.2 cm left upper lobe mass partially abutting and mildly displacing the minor fissure and also the lateral and cardiac pleural margins. This mass is associated with truncation and obstruction of the anterior segmental bronchus of the left upper lobe. Adjacent postobstructive atelectasis or pneumonitis. The top differential diagnostic consideration is primary  lung malignancy. Pulmonary and/or oncology consultation recommended. 2. Mildly enlarged left hilar and left  infrahilar lymph nodes. 3. Fullness of both adrenal glands without a well-defined adrenal mass. 4. Mild lobularity of the liver, nonspecific. 5. Suspected gallstone in the neck of the gallbladder 2.8 cm diameter. 6. Airway thickening is present, suggesting bronchitis or reactive airways disease. 7. Mild secondary pulmonary lobular interstitial accentuation in the lung apices, this can be encountered in the setting of mild interstitial edema but is technically nonspecific. 8.  Aortic Atherosclerosis (ICD10-I70.0).  EXAM: LEFT KNEE - 1-2 VIEW   COMPARISON:  01/16/2024.   FINDINGS: Similar alignment of a transverse patellar fracture. Knee joint effusion. Prepatellar soft tissue swelling. External brace in place.   IMPRESSION: Similar alignment of a transverse patellar fracture with knee joint effusion.  EXAM: LEFT SHOULDER - 2+ VIEW   COMPARISON:  None Available.   FINDINGS: Anterior dislocation of the left humeral head relative to the glenoid. Mild contour flattening of the superolateral humeral head could reflect a Hill-Sachs defect. The acromioclavicular joint is anatomically aligned with mild-to-moderate degenerative arthropathy.   IMPRESSION: 1. Anterior dislocation of the left glenohumeral joint. Mild contour flattening of the superolateral humeral head could reflect a Hill-Sachs defect. 2. Mild-to-moderate degenerative arthropathy of the acromioclavicular joint.   Assessment and plan per Dr. 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 [place]   Controlled type 2 diabetes mellitus without complication, without [name]-term current use of insulin  (HCC)   Essential hypertension   Chronic diastolic CHF (congestive heart failure) (HCC)   Hypothyroidism s/p thyroidectomy due to thyroid   cancer   Hyperlipidemia   Memory loss   OSA (obstructive sleep apnea)   Tobacco use   Palliative care encounter   ACP (advance care planning)   DNR (do not resuscitate)   Shortness of breath   Pain   Medication management   High risk medication use   #1 left transverse patella fracture -Secondary to mechanical fall and suffering a left displaced patella fracture on 01/14/2024 who fell again at home on his knees and unable to bear weight or perform ADLs and as such brought back to the ED. - Imaging done consistent with nondisplaced left patella fracture with effusion. - Patient noted to be unable to bear weight at home. - Patient seen in consultation by orthopedics, Dr. Beverley who has ordered a CT scan of the knee, for now recommending conservative management keeping his leg extended and in a knee immobilizer and orthopedics recommending reimaging it later on this week and if showing interval displacement may consider ORIF. - Bledsoe locked in extension ordered per orthopedics. - PT/OT. - WBAT when locked in extension with PT. - Per orthopedics.   2.  Lung mass -Noted on chest x-ray. - Patient has a history of hemoptysis, ongoing tobacco use, mass concerning for cancer. - CT chest with 8.3 x 6.5 x 7.2 cm left upper lobe mass partially abutting the mildly displacing the minor fissure and also the lateral and cardiac pleural margins.  Mass associated with truncation obstruction of the anterior segment bronchus of the left upper lobe.  Adjacent postobstructive atelectasis or pneumonitis.  Top differential primary lung malignancy.  Enlarged left hilar and left infrahilar lymph nodes.  Suspected gallstone in the neck of gallbladder 2.8 cm diameter.  Airway thickening suggesting bronchitis or reactive airways disease.  -Patient seen in consultation by palliative care will discuss goals of care with the family and decision made to transition to full comfort measures.   - Patient noted to be on  hospice prior to admission and would likely be discharged with hospice.   - Symptom management as per palliative care.    3.  Normocytic anemia/??UGIB -FOBT noted to be positive. Patient noted with hemoptysis and black stools, may be swallowing-hemoptysis. -Anemia panel with iron level of 20, TIBC of 302, ferritin of 71. - Hemoglobin currently stable at 9.4. - Advance to regular diet. - Follow H&H. - Continue IV PPI every 12 hours. - GI consulted and patient seen in consultation by GI who feel patient at this point in time is not a candidate for any endoscopic procedures due to his end-stage COPD.  Patient also being transition to comfort measures.   - GI recommending IV iron possibly in transfusion as needed.   - Follow H&H   4.  CKD stage II - Patient noted with a creatinine of 1.5 on presentation. -Urinalysis leukocytes negative, nitrite negative, protein of 100. - Renal function improved creatinine 1.04. - Continue gentle hydration.   5.  End-stage COPD/chronic respiratory failure on 4 L nasal cannula/probable bronchitis -Patient noted to be on hospice for end-stage COPD. - Currently on baseline home O2. - Continue scheduled DuoNeb. - Patient with ongoing tobacco. - Continue Breo Ellipta . - Continue Z-Pak. - Morphine  as needed. - Palliative care consulted for goals of care and I will follow her and decision made to transition to full comfort measures..   6.  Controlled type 2 diabetes mellitus - Hemoglobin A1c 5.8 - Continue to hold oral hypoglycemic agents. - SSI and CBGs discontinued.     7.  Hypertension - Continue to hold lisinopril , Lasix, metolazone.   - Hydralazine  as needed.   8.  Chronic diastolic CHF -2D echo from 2021 with normal EF, grade 1 DD. - Patient not volume overloaded on examination. - Continue to hold Lasix and metolazone. - Continue to hold lisinopril .   9.  Hypothyroidism status post 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 -TSH noted at 6.350. -  T4 at 1.09. - Synthroid .    10.  Hyperlipidemia - No longer on statin.   11.  Memory loss  - Patient with no formal history or diagnosis of dementia per family but had significant short-term memory loss. - Continue delirium precautions. - TOC consulted for placement.   12.  OSA -Patient noncompliant with CPAP. - Patient noted to have refused CPAP nightly.   13.  Tobacco use -Patient with no desire for tobacco cessation. - Continue nicotine  patch.     #14 dislocated left shoulder -Patient currently in sling. - Orthopedics following.   Discharge Planning- ongoing Goals of Care- clear, patient is DNR IDT- updated Family Contact:  spoke with daughter Matthew [name] by phone  Hospice Detail Summary Report set for upload into patient's records on 9.13.25 by Nat Babe, [facility] Nurse Liaison   Please do not hesitate to call with any hospice related questions or concerns.   When patient is ready for discharge, please use [facility] as we contract this service for our patients.    Eleanor Nail, [facility] Nurse Liaison [phone]

## 2024-01-18 NOTE — Consult Note (Addendum)
 Consultation  Referring Provider:   Dr. Sebastian Primary Care Physician:  [name], MD Primary Gastroenterologist: Dr. Abran         Reason for Consultation: GI bleed            HPI:   Matthew [name] is a 78 y.o. male with a past medical history significant for type 2 diabetes, hypertension, hyperlipidemia, COPD, CKD stage II, history of thyroid  cancer, OSA not on CPAP, history of ICH due to HTN, chronic respiratory failure on chronic oxygen  as well as a family history of colon cancer, who presented to the ED on 01/16/2024 after he had fallen 2 days ago and could not get around at home.  We are consulted in regards to GI bleed.    At time of admission patient described that he fell in his bathroom when he crawled to his living room and sat there and then called his daughter, he had fractured his patella and he was sent home, told he need to see a Careers adviser.  Daughter reports seeing some bright red blood in his sputum this week and blood in the toilet, also described diarrhea that was dark black.  Had noticed it 2 weeks ago.    At time interview the patient he is really unable to give me a history at all.  He grunts and groans at my questions and is unaware of any GI bleeding.  Discussed him with the nursing staff.  They describe a solid brown stool yesterday, no signs of acute GI bleeding since being admitted.  His family seems very involved per him.  Patient has really had no complaints.    Denies fever or chills.  Chart reviewed today including palliative care notes.  Care plan was discussed with the family and patient is a DNR and on comfort measures and discharge plan is home with hospice.  ER course: Hemoglobin 9.5, platelets 471, WBC 10.9, BUN 34, creatinine 1.51, fecal occult positive, chest x-ray with new masslike density projecting over the lingula measuring 9.6 x 7.7 cm, malignancy such as lung cancer not excluded, CT recommended, airway thickening which suggests bronchitis  or reactive airway disease, enlargement of the cardiopericardial silhouette, emphysema; CT cervical spine with chronic cervical spine degeneration and generalized pulmonary septal thickening in the lung apices, possibly chronic interstitial lung disease; pelvic x-ray limited due to body habitus, no overt abnormality; left knee x-ray with stable transverse patellar fracture knee effusion with prepatellar soft tissue swelling; 01/16/2024 iron low at 20, percent saturation low at 7, ferritin normal, B12 normal  GI history: 04/25/2011 office visit with Dr. Abran for hemorrhoids at that time discussed some bright red blood per rectum with bowel movements found to have friable internal hemorrhoids recommended Metamucil 2 tablespoons daily and Anusol  suppositories 12/28/2008 colonoscopy with 2 polyps removed and internal/external hemorrhoids, repeat recommended in 5 years due to a family history of colon cancer; pathology showed hyperplastic polyps  Past Medical History:  Diagnosis Date   Adenomatous polyps    Benign neoplasm of colon    Chronic kidney disease    stage 2   COPD (chronic obstructive pulmonary disease) (HCC)    Diabetes mellitus without complication (HCC)    External hemorrhoids    Hemorrhoids    Thyroid  cancer (HCC)     Past Surgical History:  Procedure Laterality Date   THYROIDECTOMY      Family History  Problem Relation Age of Onset   Heart attack Father  Hypertension Father    Breast cancer Mother    Hypertension Mother    Hypertension Brother    Heart attack Brother    Heart attack Sister    Social History   Tobacco Use   Smoking status: Every Day    Current packs/day: 1.50    Types: Cigarettes   Smokeless tobacco: Never   Tobacco comments:    Counseling sheet given in exam room for smoking   Vaping Use   Vaping status: Unknown  Substance Use Topics   Alcohol  use: Not Currently   Drug use: No    Prior to Admission medications   Medication Sig Start Date  End Date Taking? Authorizing Provider  acetaminophen  (TYLENOL ) 500 MG tablet Take 1,000 mg by mouth every 4 (four) hours as needed for mild pain (pain score 1-3) or moderate pain (pain score 4-6).   Yes [provider]  albuterol  (VENTOLIN  HFA) 108 (90 Base) MCG/ACT inhaler Inhale 2 puffs into the lungs every 4 (four) hours as needed for wheezing or shortness of breath. 08/19/23  Yes [provider]  furosemide (LASIX) 40 MG tablet Take 40 mg by mouth 2 (two) times daily. 01/01/24  Yes [provider]  ipratropium-albuterol  (DUONEB) 0.5-2.5 (3) MG/3ML SOLN Take 3 mLs by nebulization every 6 (six) hours as needed (wheezing/sob). 01/02/24  Yes [provider]  levothyroxine  (SYNTHROID ) 200 MCG tablet Take 200 mcg by mouth daily before breakfast. 09/01/18  Yes [provider]  lisinopril  (ZESTRIL ) 40 MG tablet Take 1 tablet (40 mg total) by mouth daily. Patient taking differently: Take 20 mg by mouth every evening. 10/24/18  Yes Arminda Daved HERO, MD  metFORMIN  (GLUCOPHAGE -XR) 750 MG 24 hr tablet Take 750 mg by mouth in the morning and at bedtime.   Yes [provider]  metolazone (ZAROXOLYN) 2.5 MG tablet Take 2.5 mg by mouth 2 (two) times a week. Take 2.5 mg by mouth twice a day on Monday and Thursday 12/25/23  Yes [provider]  Morphine  Sulfate (MORPHINE  CONCENTRATE) 10 mg / 0.5 ml concentrated solution TAKE 5 MG (5MG =0.25ML) BY MOUTH EVERY 4 HOURS AS NEEDED 12/17/23  Yes [provider]  traMADol  (ULTRAM ) 50 MG tablet Take 1 tablet (50 mg total) by mouth every 6 (six) hours as needed. Patient taking differently: Take 50 mg by mouth in the morning and at bedtime. 01/14/24  Yes Raford Lenis, MD  atorvastatin  (LIPITOR) 40 MG tablet TAKE 1 TABLET BY MOUTH DAILY Patient not taking: Reported on 01/16/2024 05/07/23   Whitfield Raisin, NP  JARDIANCE 10 MG TABS tablet Take 10 mg by mouth daily. Patient not taking: Reported on 01/16/2024 11/18/18    [provider]  Semaglutide,0.25 or 0.5MG /DOS, (OZEMPIC, 0.25 OR 0.5 MG/DOSE,) 2 MG/1.5ML SOPN See admin instructions. Patient not taking: Reported on 01/16/2024 11/27/20   [provider]  SYMBICORT 160-4.5 MCG/ACT inhaler SMARTSIG:2 Puff(s) By Mouth Every 12 Hours Patient not taking: Reported on 01/16/2024 02/02/20   [provider]    Current Facility-Administered Medications  Medication Dose Route Frequency Provider Last Rate Last Admin   acetaminophen  (TYLENOL ) tablet 650 mg  650 mg Oral Q6H PRN Waddell Rake, MD   650 mg at 01/18/24 9376   Or   acetaminophen  (TYLENOL ) suppository 650 mg  650 mg Rectal Q6H PRN Waddell Rake, MD       amLODipine  (NORVASC ) tablet 5 mg  5 mg Oral Daily [name], MD       antiseptic oral rinse (  BIOTENE) solution 15 mL  15 mL Topical PRN Mims, Lauren W, DO       artificial tears ophthalmic solution 1 drop  1 drop Both Eyes QID PRN Mims, Lauren W, DO       azithromycin  (ZITHROMAX ) tablet 250 mg  250 mg Oral Daily [name], MD       bacitracin  ointment   Topical BID [name], MD   Given at 01/17/24 2124   fluticasone  furoate-vilanterol (BREO ELLIPTA ) 200-25 MCG/ACT 1 puff  1 puff Inhalation Daily Waddell Rake, MD   1 puff at 01/17/24 0809   glycopyrrolate  (ROBINUL ) tablet 1 mg  1 mg Oral Q4H PRN Clayton Tinnie ORN, DO       Or   glycopyrrolate  (ROBINUL ) injection 0.2 mg  0.2 mg Subcutaneous Q4H PRN Mims, Lauren W, DO       Or   glycopyrrolate  (ROBINUL ) injection 0.2 mg  0.2 mg Intravenous Q4H PRN Mims, Lauren W, DO       haloperidol  (HALDOL ) tablet 0.5 mg  0.5 mg Oral Q4H PRN Mims, Lauren W, DO       Or   haloperidol  (HALDOL ) 2 MG/ML solution 0.5 mg  0.5 mg Sublingual Q4H PRN Mims, Lauren W, DO       Or   haloperidol  lactate (HALDOL ) injection 0.5 mg  0.5 mg Intravenous Q4H PRN Clayton Tinnie ORN, DO   0.5 mg at 01/18/24 0401   hydrALAZINE  (APRESOLINE ) injection 5 mg  5 mg Intravenous Q8H PRN Waddell Rake,  MD       ipratropium-albuterol  (DUONEB) 0.5-2.5 (3) MG/3ML nebulizer solution 3 mL  3 mL Nebulization Q6H PRN Waddell Rake, MD   3 mL at 01/17/24 1558   ipratropium-albuterol  (DUONEB) 0.5-2.5 (3) MG/3ML nebulizer solution 3 mL  3 mL Nebulization TID [name], MD       levothyroxine  (SYNTHROID ) tablet 200 mcg  200 mcg Oral QAC breakfast Waddell Rake, MD   200 mcg at 01/18/24 0600   morphine  (PF) 4 MG/ML injection 3 mg  3 mg Intravenous Q3H PRN Clayton Tinnie ORN, DO   3 mg at 01/18/24 9375   nicotine  (NICODERM CQ  - dosed in mg/24 hours) patch 14 mg  14 mg Transdermal Daily Mims, Lauren W, DO   14 mg at 01/17/24 1726   ondansetron  (ZOFRAN ) tablet 4 mg  4 mg Oral Q6H PRN Waddell Rake, MD       Or   ondansetron  (ZOFRAN ) injection 4 mg  4 mg Intravenous Q6H PRN Waddell Rake, MD       oxyCODONE  (Oxy IR/ROXICODONE ) immediate release tablet 5 mg  5 mg Oral Q4H PRN Clayton Tinnie ORN, DO   5 mg at 01/18/24 9257   pantoprazole  (PROTONIX ) injection 40 mg  40 mg Intravenous Q12H Waddell Rake, MD   40 mg at 01/17/24 2123   senna-docusate (Senokot-S) tablet 1 tablet  1 tablet Oral BID [name], MD   1 tablet at 01/17/24 9065    Allergies as of 01/16/2024   (No Known Allergies)     Review of Systems(limited due to short term memory issues):    Constitutional: No weight loss Skin: No rash Cardiovascular: No chest pain Respiratory: No SOB Gastrointestinal: See HPI and otherwise negative Genitourinary: No dysuria  Neurological: No headache Musculoskeletal: No new muscle or joint pain Hematologic: No bruising Psychiatric: No history of depression or anxiety    Physical Exam:  Vital signs in last 24 hours: Temp:  [97.6 F (  36.4 C)-98.4 F (36.9 C)] 97.6 F (36.4 C) (09/15 0426) Pulse Rate:  [99-107] 99 (09/15 0426) Resp:  [16-18] 16 (09/15 0426) BP: (122-159)/(59-72) 159/69 (09/15 0426) SpO2:  [89 %-95 %] 95 % (09/15 0426) Last BM Date : 01/16/24 General: Lethargic  Caucasian male appears to be in NAD, Well developed, Well nourished, falling asleep at time of my exam and questioning Head:  Normocephalic and atraumatic. Eyes:   PEERL, EOMI. No icterus. Conjunctiva pink. Ears:  Normal auditory acuity. Neck:  Supple Throat: Oral cavity and pharynx without inflammation, swelling or lesion.  Lungs: Respirations even and unlabored. Lungs clear to auscultation bilaterally.   No wheezes, crackles, or rhonchi.  Heart: Normal S1, S2. No MRG. Regular rate and rhythm. No peripheral edema, cyanosis or pallor.  Abdomen:  Soft, nondistended, nontender. No rebound or guarding. Normal bowel sounds. No appreciable masses or hepatomegaly. Rectal:  Not performed.  Msk: + Brace on left knee Extremities: As above, otherwise normal Neurologic:  oriented x2 (person and place) Skin:   Dry and intact without significant lesions or rashes. Psychiatric: Unable to answer questions   LAB RESULTS: Recent Labs    01/16/24 1715 01/16/24 2336 01/17/24 0400  WBC 11.7* 12.1* 11.2*  HGB 9.2* 8.9* 9.1*  HCT 31.0* 30.0* 29.5*  PLT 484* 472* 449*   BMET Recent Labs    01/16/24 1044 01/17/24 0625  NA 137 134*  K 3.5 3.7  CL 91* 90*  CO2 34* 29  GLUCOSE 138* 127*  BUN 34* 29*  CREATININE 1.51* 1.21  CALCIUM  7.5* 7.6*   LFT Recent Labs    01/16/24 1044  PROT 6.5  ALBUMIN 3.1*  AST 25  ALT 16  ALKPHOS 110  BILITOT 0.2   PT/INR Recent Labs    01/16/24 1715  LABPROT 13.0  INR 0.9    STUDIES: CT CHEST W CONTRAST Result Date: 01/17/2024 CLINICAL DATA:  Left lung mass on radiography, for further imaging workup EXAM: CT CHEST WITH CONTRAST TECHNIQUE: Multidetector CT imaging of the chest was performed during intravenous contrast administration. RADIATION DOSE REDUCTION: This exam was performed according to the departmental dose-optimization program which includes automated exposure control, adjustment of the mA and/or kV according to patient size and/or use of  iterative reconstruction technique. CONTRAST:  75mL OMNIPAQUE  IOHEXOL  300 MG/ML  SOLN COMPARISON:  01/16/2024 FINDINGS: Cardiovascular: Coronary, aortic arch, and branch vessel atherosclerotic vascular disease. Mild cardiomegaly. Mediastinum/Nodes: Left hilar node 1.1 cm in short axis, image 20 series 3. Left infrahilar node 1.2 cm in short axis, image 27 series 3. Right and left paratracheal lymph nodes are not pathologically enlarged by size criteria. Lungs/Pleura: 8.3 by 6.5 by 7.2 cm left upper lobe mass partially abutting and mildly displacing the minor fissure and also the lateral and cardiac pleural margins. This mass is associated with truncation and obstruction of the anterior segmental bronchus of the left upper lobe. Adjacent postobstructive atelectasis or pneumonitis. The top differential diagnostic consideration is primary lung malignancy. Emphysema noted. Dependent atelectasis or scarring in both lower lobes with a bulla in the posterior basal segment right lower lobe. Airway thickening is present, suggesting bronchitis or reactive airways disease. Mild secondary pulmonary lobular interstitial accentuation in the lung apices, this can be encountered in the setting of mild interstitial edema but is technically nonspecific. Upper Abdomen: Fullness of both adrenal glands without a well-defined adrenal mass. Mild lobularity of the liver, nonspecific. Suspected gallstone in the neck of the gallbladder 2.8 cm diameter. Musculoskeletal: Thoracic spondylosis. IMPRESSION:  1. 8.3 by 6.5 by 7.2 cm left upper lobe mass partially abutting and mildly displacing the minor fissure and also the lateral and cardiac pleural margins. This mass is associated with truncation and obstruction of the anterior segmental bronchus of the left upper lobe. Adjacent postobstructive atelectasis or pneumonitis. The top differential diagnostic consideration is primary lung malignancy. Pulmonary and/or oncology consultation recommended.  2. Mildly enlarged left hilar and left infrahilar lymph nodes. 3. Fullness of both adrenal glands without a well-defined adrenal mass. 4. Mild lobularity of the liver, nonspecific. 5. Suspected gallstone in the neck of the gallbladder 2.8 cm diameter. 6. Airway thickening is present, suggesting bronchitis or reactive airways disease. 7. Mild secondary pulmonary lobular interstitial accentuation in the lung apices, this can be encountered in the setting of mild interstitial edema but is technically nonspecific. 8.  Aortic Atherosclerosis (ICD10-I70.0). Electronically Signed   By: Ryan Salvage M.D.   On: 01/17/2024 15:33   CT Cervical Spine Wo Contrast Result Date: 01/16/2024 CLINICAL DATA:  78 year old male with recurrent falls. EXAM: CT CERVICAL SPINE WITHOUT CONTRAST TECHNIQUE: Multidetector CT imaging of the cervical spine was performed without intravenous contrast. Multiplanar CT image reconstructions were also generated. RADIATION DOSE REDUCTION: This exam was performed according to the departmental dose-optimization program which includes automated exposure control, adjustment of the mA and/or kV according to patient size and/or use of iterative reconstruction technique. COMPARISON:  Head CT today.  Cervical spine CT 01/14/2024. FINDINGS: Alignment: Stable reversal of cervical lordosis. Cervicothoracic junction alignment is within normal limits. Bilateral posterior element alignment is within normal limits. Skull base and vertebrae: Visualized skull base is intact. No atlanto-occipital dissociation. C1 and C2 appear intact and aligned. No acute osseous abnormality identified. Soft tissues and spinal canal: Calcified cervical carotid atherosclerosis. Otherwise negative visible noncontrast neck soft tissues. Disc levels: Chronic cervical spine degeneration, with developing degenerative facet ankylosis on the left at C3-C4. Chronic disc and endplate degeneration maximal at C5-C6. Upper chest: Visible  upper thoracic levels appear intact. Generalized pulmonary septal thickening in the lung apices appears stable, nonspecific. And somewhat similar to 2021 neck CTA appearance. IMPRESSION: 1. No acute traumatic injury identified in the cervical spine. 2. Chronic cervical spine degeneration. 3. 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 Electronically Signed   By: VEAR Hurst M.D.   On: 01/16/2024 12:14   CT Head Wo Contrast Result Date: 01/16/2024 CLINICAL DATA:  78 year old male with recurrent falls. EXAM: CT HEAD WITHOUT CONTRAST TECHNIQUE: Contiguous axial images were obtained from the base of the skull through the vertex without intravenous contrast. RADIATION DOSE REDUCTION: This exam was performed according to the departmental dose-optimization program which includes automated exposure control, adjustment of the mA and/or kV according to patient size and/or use of iterative reconstruction technique. COMPARISON:  Brain MRI 12/05/2019.  Head CT 01/14/2024. FINDINGS: Brain: No midline shift, ventriculomegaly, mass effect, evidence of mass lesion, intracranial hemorrhage or evidence of cortically based acute infarction. Confluent bilateral cerebral white matter hypodensity is stable. Vascular: Calcified atherosclerosis at the skull base. No suspicious intracranial vascular hyperdensity. Skull: Stable, no acute fracture identified. Nonspecific multiple small but circumscribed lucent areas at the skull vertex, but these appears stable since at least 2021 and benign. Sinuses/Orbits: Visualized paranasal sinuses and mastoids are stable and well aerated. Other: No acute orbit or scalp soft tissue injury identified. IMPRESSION: 1. No acute intracranial abnormality or acute traumatic injury identified. 2. Stable chronic white matter disease. Electronically Signed  By: VEAR Hurst M.D.   On: 01/16/2024 12:11   DG Knee Complete 4 Views Left Result Date:  01/16/2024 CLINICAL DATA:  Patellar fracture EXAM: LEFT KNEE - COMPLETE 4+ VIEW COMPARISON:  01/14/2024 FINDINGS: Stable transverse patellar fracture and knee effusion. Prepatellar soft tissue swelling. No new fracture identified. IMPRESSION: 1. Stable transverse patellar fracture and knee effusion. Prepatellar soft tissue swelling. Electronically Signed   By: Ryan Salvage M.D.   On: 01/16/2024 12:07   DG Femur Min 2 Views Left Result Date: 01/16/2024 CLINICAL DATA:  Fall, hip and knee pain EXAM: LEFT FEMUR 2 VIEWS COMPARISON:  None Available. FINDINGS: Atheromatous vascular calcifications. Knee effusion observed with a transverse patellar fracture. Cross-table lateral view of the proximal femur severely suboptimal related to patient mobility and body habitus issues. IMPRESSION: 1. Transverse patellar fracture with knee effusion. 2. Atheromatous vascular calcifications. 3. Severely suboptimal cross-table lateral view of the proximal femur related to patient mobility and body habitus issues. Electronically Signed   By: Ryan Salvage M.D.   On: 01/16/2024 12:02   DG Pelvis Portable Result Date: 01/16/2024 CLINICAL DATA:  Fall, left hip pain EXAM: PORTABLE PELVIS 1-2 VIEWS COMPARISON:  None Available. FINDINGS: Limited assessment due to patient body habitus and difficulty positioning related to mobility and pain. In particular, there is poor definition of portions of the left iliac bone and left hemipelvis. The pelvis is mildly rotated to the left. No overt cortical discontinuity characteristic fracture is identified. IMPRESSION: 1. Limited assessment due to patient body habitus and difficulty positioning related to mobility and pain. In particular, there is poor definition of portions of the left iliac bone and left hemipelvis. No overt cortical discontinuity characteristic of fracture is identified. Electronically Signed   By: Ryan Salvage M.D.   On: 01/16/2024 12:00   DG Chest Portable 1  View Result Date: 01/16/2024 CLINICAL DATA:  Fall during transport to bedside commode. History of patellar fracture due to a prior fall. EXAM: PORTABLE CHEST 1 VIEW COMPARISON:  12/05/2019 FINDINGS: New masslike density projecting over the lingula measuring proximally 9.6 by 7.7 cm. Malignancy is not excluded and chest CT (with contrast if feasible) is recommended for further characterization. Emphysema noted. Mild enlargement of the cardiopericardial silhouette. Aortic atherosclerosis noted. Airway thickening is present, suggesting bronchitis or reactive airways disease. IMPRESSION: 1. New masslike density projecting over the lingula measuring 9.6 x 7.7 cm. Malignancy such as lung cancer is not excluded and chest CT (with contrast if feasible) is recommended for further characterization. 2. Airway thickening is present, suggesting bronchitis or reactive airways disease. 3. Mild enlargement of the cardiopericardial silhouette. 4. Aortic Atherosclerosis (ICD10-I70.0) and Emphysema (ICD10-J43.9). Electronically Signed   By: Ryan Salvage M.D.   On: 01/16/2024 11:59     Impression / Plan:  Matthew [name] is a 78 y.o. male with a past medical history significant for type 2 diabetes, hypertension, hyperlipidemia, COPD, CKD stage II, history of thyroid  cancer, OSA not on CPAP, history of ICH due to HTN, chronic respiratory failure on chronic oxygen  as well as a family history of colon cancer, who presented to the ED on 01/16/2024 after he had fallen 2 days ago and could not get around at home.  We are consulted in regards to GI bleed.  Impression: 1.  GI bleed with iron deficiency anemia: Per family they were seeing melena and bright red blood in his stools at home, Hemoccult positive here in the ED, no bleeding witnessed since the time of admission  yesterday per nursing staff with a normal solid brown stool yesterday, last colonoscopy 2010 with repeat recommended in 5 years given a family history of colon  cancer, 2 hyperplastic polyps at that time; consider upper versus lower GI bleed 2.  Left transverse patellar fracture: After a fall, currently in a brace 3.  Lung mass: New on imaging this hospitalization, spoken with palliative care with plans to go home on hospice DNR and comfort measures 4.  CKD stage II 5.  COPD with chronic respiratory failure on 4 L nasal cannula 6.  Diabetes type 2 7.  CHF 8.  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  OSA: With chronic respiratory failure on chronic oxygen  at 4 L via nasal cannula at home  Plan: 1.  Per chart review it appears this patient is DNR and on comfort measures with plans to go home with hospice.  There are no signs of acute GI bleed since admission.  Will confirm with the hospitalist team for goals of care, but likely would not pursue EGD and colonoscopy. 2.  Continue to monitor for signs of acute GI bleeding and monitor hemoglobin while here with transfusion as needed less than 7.  Could consider iron infusion. 3.  Continue Pantoprazole  40 mg IV twice daily 4. Called and spoke with patient's daughter [name] and she is going to discuss the idea of procedures with the other sisters to make sure they are ok with not pursuing.  Thank you for your kind consultation, pending family wishes we may sign off.  Delon Gibson Lemmon  01/18/2024, 8:51 AM  Addendum: 01/18/24 12:42  Spoke with patients daughter [name] via phone, she discussed possibility of EGD/Colon with the other sisters and they have all agreed not to pursue any procedures right now.  They do not want their father to undergo anesthesia.  Would keep patient on Pantoprazole  40mg  BID at discharge and give dose of IV iron here.  We will sign off today.  Please call us  back if we can be of further assistance.  Delon Failing, PA-C     Attending Physician's Attestation   I have taken an interval history, reviewed the chart and examined the patient.   This  is a patient that the GI service is asked to evaluate in the setting of heme positive stool and anemia in the setting of a patient with multiple medical comorbidities who is on hospice with potential transition to comfort measures with do not resuscitation orders in place.  He actually presented to the hospital with issues of recurring falls.  Overt GI bleeding has not been noted by the patient (though he is a poor historian).  While in-house, he has had formed stool that has been heme positive.  History difficult to obtain, but patient denies significant GI bleeding.  He himself is not interested in procedures, but he defers all of his care to his 4 daughters.  Patient on exam is chronically ill-appearing and denies any significant abdominal pain.  Very significantly protuberant abdomen.  He has just been noted to have urination all over his bed which she is being cleaned up by his nursing team.  No stool has been noted today.  Patient remains on oxygen  and is quite high risk from a endoscopic anesthesia standpoint.  In most cases, it would be reasonable to consider the role of EGD +/- colonoscopy for individuals with heme positive stool and anemia.  In this particular case however, patient is nearing the end of  his life with comfort measures and DNR status remaining.  After discussion by my team with the patient's daughters, they would like to hold on endoscopic therapies/evaluations and I think that that is reasonable based on his current clinical status.  If the patient were to have transfusion dependent anemia develop, then 1 could consider beginning with an upper endoscopy before putting him through a colonoscopy, again I am not sure he would be able to even take the preparation without an NG tube in place.  He would be high risk for complications during procedure.  Please reach back out if the situation changes.  Remain on PPI and dose him with IV iron if needed before discharge.  Time will tell, but from our  standpoint inpatient GI will sign off with plan for no endoscopic therapies per patient's family desires and overall clinical status.  I agree with the Advanced Practitioner's note, impression, and recommendations with updates and my documentation as noted above.  The majority of the medical decision making/process, formulation of the impression/plan of action for the patient were performed by me with substantive portion of this encounter (>50% time spent including complete performance of at least one of the key components of MDM, History, and/or Exam).   Aloha Finner, MD [facility]oscopy Office # [phone]

## 2024-01-18 NOTE — Progress Notes (Signed)
 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  Patient seen in the ED few days prior to admission noted to have a fractured patella and sent home with outpatient follow-up with orthopedics.  Patient presented back as unable to tolerate his brace and unable to take care of himself and bear weight.  Also noted was concerns for possible melanotic stools and diarrhea.  Patient also with some concerns of some hemoptysis.  Chest x-ray done concerning for pulmonary mass.  FOBT noted to be positive.  CT chest done for further evaluation of pulmonary mass.  Orthopedics, GI, palliative care consulted.   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 - Imaging done consistent with nondisplaced left patella fracture with effusion. - Patient noted to be unable to bear weight at home. - Patient seen in consultation by orthopedics, Dr. Beverley who has ordered a CT scan of the knee, for now recommending conservative management keeping his leg extended and in a knee immobilizer and orthopedics recommending reimaging it later on this week and if showing interval displacement may consider ORIF. - Bledsoe locked in extension ordered per orthopedics. - PT/OT. - WBAT when locked in extension with PT. - Per orthopedics.  2.  Lung mass -Noted on chest x-ray. - Patient has a history of hemoptysis, ongoing tobacco use, mass concerning for cancer. - CT chest with 8.3 x 6.5 x 7.2 cm left upper lobe mass partially abutting the mildly displacing the minor fissure and also the lateral and cardiac pleural margins.  Mass associated with truncation obstruction of the anterior segment bronchus of the left upper lobe.  Adjacent postobstructive atelectasis or pneumonitis.  Top differential primary lung malignancy.  Enlarged left hilar and left infrahilar lymph nodes.  Suspected gallstone in the neck of gallbladder 2.8 cm diameter.  Airway thickening suggesting bronchitis or reactive airways disease.  -Patient seen in consultation by palliative care will discuss goals of care with the family and decision made to transition to full comfort measures.   - Patient noted to be on hospice prior to admission  and would likely be discharged with hospice.   - Symptom management as per palliative care.   3.  Normocytic anemia/??UGIB -FOBT noted to be positive. Patient noted with hemoptysis and black stools, may be swallowing-hemoptysis. -Anemia panel with iron level of 20, TIBC of 302, ferritin of 71. - Hemoglobin currently stable at 9.4. - Advance to regular diet. -  Follow H&H. - Continue IV PPI every 12 hours. - GI consulted and patient seen in consultation by GI who feel patient at this point in time is not a candidate for any endoscopic procedures due to his end-stage COPD.  Patient also being transition to comfort measures.   - GI recommending IV iron possibly in transfusion as needed.   - Follow H&H  4.  CKD stage II - Patient noted with a creatinine of 1.5 on presentation. -Urinalysis leukocytes negative, nitrite negative, protein of 100. - Renal function improved creatinine 1.04. - Continue gentle hydration.  5.  End-stage COPD/chronic respiratory failure on 4 L nasal cannula/probable bronchitis -Patient noted to be on hospice for end-stage COPD. - Currently on baseline home O2. - Continue scheduled DuoNeb. - Patient with ongoing tobacco. - Continue Breo Ellipta . - Continue Z-Pak. - Morphine  as needed. - Palliative care consulted for goals of care and I will follow her and decision made to transition to full comfort measures..  6.  Controlled type 2 diabetes mellitus - Hemoglobin A1c 5.8 - Continue to hold oral hypoglycemic agents. - SSI and CBGs discontinued.    7.  Hypertension - Continue to hold lisinopril , Lasix, metolazone.   - Hydralazine  as needed.  8.  Chronic diastolic CHF -2D echo from 2021 with normal EF, grade 1 DD. - Patient not volume overloaded on examination. - Continue to hold Lasix and metolazone. - Continue to hold lisinopril .  9.  Hypothyroidism status post 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 -TSH noted at 6.350. - T4 at 1.09. - Synthroid .   10.  Hyperlipidemia - No longer on statin.  11.  Memory loss  - Patient with no formal history or diagnosis of dementia per family but had significant short-term memory loss. - Continue delirium precautions. - TOC consulted for placement.  12.  OSA -Patient noncompliant with CPAP. - Patient noted to have refused CPAP nightly.  13.  Tobacco use -Patient with no  desire for tobacco cessation. - Continue nicotine  patch.    #14 dislocated left shoulder -Patient currently in sling. - Orthopedics following.  DVT prophylaxis: SCDs Code Status: DNR Family Communication: Updated patient, no family at bedside. Disposition: Patient with unsafe disposition.  TBD  Status is: Inpatient Remains inpatient appropriate because: Severity of illness   Consultants:  Orthopedics: Dr. Beverley 01/17/2023 Palliative care: Dr. Clayton 01/17/2024 GI: Dr. Wilhelmenia 01/18/2024  Procedures:  Chest x-ray 01/16/2024 Painful to the left femur 01/16/2024 Plain films of the left knee 01/16/2024 Plain films of the pelvis 01/16/2024 CT chest 01/17/2024   Antimicrobials:  Anti-infectives (From admission, onward)    Start     Dose/Rate Route Frequency Ordered Stop   01/18/24 1000  azithromycin  (ZITHROMAX ) tablet 250 mg       Placed in Followed by Linked Group   250 mg Oral Daily 01/17/24 0838 01/22/24 0959   01/17/24 1000  azithromycin  (ZITHROMAX ) tablet 500 mg       Placed in Followed by Linked Group   500 mg Oral Daily 01/17/24 0838 01/17/24 0934         Subjective: Patient sitting up in bed.  Sleeping.  Sling noted.  Events overnight noted.  Patient arousable.  Initially denies any shortness of breath and then subsequently does endorse shortness of breath.  Denies any chest pain.  No abdominal pain.  Patient noted overnight to have been sitting on the floor of the bed and may have fallen leading to probable left shoulder dislocation.  Objective: Vitals:   01/17/24 2014 01/17/24 2041 01/18/24 0426 01/18/24 0907  BP:  (!) 122/59 (!) 159/69   Pulse:  (!) 102 99   Resp:  16 16   Temp:  98.4 F (36.9 C) 97.6 F (36.4 C)   TempSrc:  Oral Oral   SpO2: 94% (!) 89% 95% 94%  Weight:      Height:        Intake/Output Summary (Last 24 hours) at 01/18/2024 1014 Last data filed at 01/18/2024 0446 Gross per 24 hour  Intake 727.58 ml  Output 1000 ml  Net -272.42 ml    Filed Weights   01/16/24 1032 01/16/24 2001  Weight: 98.9 kg 105.5 kg    Examination:  General exam: NAD. Respiratory system: Decreased breath sounds in the bases.  No significant wheezing.  Fair air movement.  Speaking in full sentences.  Normal respiratory effort.  Cardiovascular system: Regular rate rhythm no murmurs rubs or gallops.  No JVD.  No pitting lower extremity edema.  Gastrointestinal system: Abdomen is soft, obese, nontender to palpation, positive bowel sounds.  No rebound.  No guarding. Central nervous system: Alert and oriented.  Moving extremities spontaneously.  No focal neurological deficits. Extremities: Left lower extremity in knee immobilizer.  Left upper extremity shoulder dislocation. Skin: No rashes, lesions or ulcers Psychiatry: Judgement and insight appear poor. Mood & affect appropriate.     Data Reviewed: I have personally reviewed following labs and imaging studies  CBC: Recent Labs  Lab 01/16/24 1044 01/16/24 1715 01/16/24 2336 01/17/24 0400 01/18/24 0853  WBC 10.9* 11.7* 12.1* 11.2* 11.3*  NEUTROABS 8.8*  --   --   --  8.9*  HGB 9.5* 9.2* 8.9* 9.1* 9.4*  HCT 31.6* 31.0* 30.0* 29.5* 30.9*  MCV 94.9 96.0 95.2 95.5 94.8  PLT 471* 484* 472* 449* 458*    Basic Metabolic Panel: Recent Labs  Lab 01/16/24 1044 01/17/24 0625 01/18/24 0853  NA 137 134* 133*  K 3.5 3.7 3.6  CL 91* 90* 91*  CO2 34* 29 31  GLUCOSE 138* 127* 134*  BUN 34* 29* 28*  CREATININE 1.51* 1.21 1.04  CALCIUM  7.5* 7.6* 7.8*    GFR: Estimated Creatinine Clearance: 72.4 mL/min (by C-G formula based on SCr of 1.04 mg/dL).  Liver Function Tests: Recent Labs  Lab 01/16/24 1044  AST 25  ALT 16  ALKPHOS 110  BILITOT 0.2  PROT 6.5  ALBUMIN 3.1*    CBG: Recent Labs  Lab 01/16/24 2002 01/17/24 0733 01/17/24 1308 01/17/24 1628 01/17/24 2054  GLUCAP 167* 135* 168* 128* 149*     No results found for this or any previous visit (from the past 240 hours).        Radiology Studies: CT CHEST W CONTRAST Result Date: 01/17/2024 CLINICAL DATA:  Left lung mass on radiography, for further imaging workup EXAM: CT CHEST WITH CONTRAST TECHNIQUE: Multidetector CT imaging of the chest was performed during intravenous contrast administration. RADIATION DOSE REDUCTION: This exam was performed according to the departmental dose-optimization program which includes automated exposure control, adjustment of the mA and/or kV according to patient size and/or use of iterative reconstruction technique. CONTRAST:  75mL OMNIPAQUE  IOHEXOL  300 MG/ML  SOLN COMPARISON:  01/16/2024 FINDINGS: Cardiovascular: Coronary, aortic arch, and branch vessel atherosclerotic vascular disease. Mild cardiomegaly. Mediastinum/Nodes: Left hilar node 1.1 cm in short axis, image 20 series 3. Left infrahilar node 1.2 cm in short axis, image 27 series 3. Right and left paratracheal lymph nodes are not pathologically enlarged by size criteria. Lungs/Pleura: 8.3 by 6.5 by 7.2 cm left upper lobe mass partially abutting and mildly displacing the minor fissure and also the lateral and cardiac pleural margins. This mass is associated with truncation and obstruction of the anterior segmental bronchus of the left upper lobe. Adjacent postobstructive atelectasis or pneumonitis. The top differential diagnostic consideration is primary lung malignancy. Emphysema noted. Dependent atelectasis or scarring in both lower lobes with a bulla in the posterior basal segment right lower lobe. Airway thickening is present, suggesting bronchitis or reactive airways disease. Mild secondary pulmonary lobular interstitial accentuation in the lung apices, this can be encountered in the setting of mild interstitial edema but is technically nonspecific. Upper Abdomen: Fullness of both adrenal glands without a well-defined adrenal mass. Mild lobularity of the liver, nonspecific. Suspected gallstone in the neck of the gallbladder 2.8 cm  diameter. Musculoskeletal: Thoracic spondylosis. IMPRESSION: 1. 8.3 by 6.5 by 7.2 cm left upper lobe mass partially abutting and mildly displacing the minor fissure and also the lateral and cardiac pleural margins. This mass is associated with truncation and obstruction of the anterior segmental bronchus of the left upper lobe. Adjacent postobstructive atelectasis or pneumonitis. The top differential diagnostic consideration is primary lung malignancy. Pulmonary and/or oncology consultation recommended. 2. Mildly enlarged left hilar and left infrahilar lymph nodes. 3. Fullness of both adrenal glands without a well-defined adrenal mass. 4. Mild lobularity of the liver, nonspecific. 5. Suspected gallstone in the neck of the gallbladder 2.8 cm diameter. 6. Airway thickening is present, suggesting bronchitis or reactive airways disease. 7. Mild secondary pulmonary lobular interstitial accentuation in the lung apices, this can be encountered in the setting of mild interstitial edema but is technically nonspecific. 8.  Aortic Atherosclerosis (ICD10-I70.0). Electronically Signed   By: Ryan Salvage M.D.   On: 01/17/2024 15:33   CT Cervical Spine Wo Contrast Result Date: 01/16/2024 CLINICAL DATA:  78 year old male with recurrent falls. EXAM: CT CERVICAL SPINE WITHOUT CONTRAST TECHNIQUE: Multidetector CT imaging of the cervical spine was performed without intravenous contrast. Multiplanar CT image reconstructions were also generated. RADIATION DOSE REDUCTION: This exam was performed according to the departmental dose-optimization program which includes automated exposure control, adjustment of the mA and/or kV according to patient size and/or use of iterative reconstruction technique. COMPARISON:  Head CT today.  Cervical spine CT 01/14/2024. FINDINGS: Alignment: Stable reversal of cervical lordosis. Cervicothoracic junction alignment is within normal limits. Bilateral posterior element alignment is within normal  limits. Skull base and vertebrae: Visualized skull base is intact. No atlanto-occipital dissociation. C1 and C2 appear intact and aligned. No acute osseous abnormality identified. Soft tissues and spinal canal: Calcified cervical carotid atherosclerosis. Otherwise negative visible noncontrast neck soft tissues. Disc levels: Chronic cervical spine degeneration, with developing degenerative facet ankylosis on the left at C3-C4. Chronic disc and endplate degeneration maximal at C5-C6. Upper chest: Visible upper thoracic levels appear intact. Generalized pulmonary septal thickening in the lung apices appears stable, nonspecific. And somewhat similar to 2021 neck CTA appearance. IMPRESSION: 1. No acute traumatic injury identified in the cervical spine. 2. Chronic cervical spine degeneration. 3. 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 Electronically Signed   By: VEAR Hurst M.D.   On: 01/16/2024 12:14   CT Head Wo Contrast Result Date: 01/16/2024 CLINICAL DATA:  78 year old male with recurrent falls. EXAM: CT HEAD WITHOUT CONTRAST TECHNIQUE: Contiguous axial images were obtained from the base of the skull through the vertex without intravenous contrast. RADIATION DOSE REDUCTION: This exam was performed according to the departmental dose-optimization program which includes automated exposure control, adjustment of the mA and/or kV according to patient size and/or use of iterative reconstruction technique. COMPARISON:  Brain MRI 12/05/2019.  Head CT 01/14/2024. FINDINGS: Brain: No midline shift, ventriculomegaly, mass effect, evidence of mass lesion, intracranial hemorrhage or evidence of cortically based acute infarction. Confluent bilateral cerebral white matter hypodensity is stable. Vascular: Calcified atherosclerosis at the skull base. No suspicious intracranial vascular hyperdensity. Skull: Stable, no acute fracture identified. Nonspecific multiple small  but circumscribed lucent areas at the skull vertex, but these appears stable since at least 2021 and benign. Sinuses/Orbits: Visualized paranasal sinuses and mastoids are stable and well aerated. Other: No acute orbit or scalp soft tissue injury identified. IMPRESSION: 1. No acute intracranial abnormality or acute traumatic injury identified. 2. Stable chronic white matter disease. Electronically Signed   By: VEAR Hurst M.D.   On: 01/16/2024 12:11   DG Knee Complete 4 Views Left Result Date: 01/16/2024 CLINICAL DATA:  Patellar fracture EXAM: LEFT KNEE - COMPLETE 4+ VIEW COMPARISON:  01/14/2024 FINDINGS: Stable transverse patellar fracture and knee effusion. Prepatellar soft tissue swelling. No new fracture identified. IMPRESSION: 1. Stable transverse patellar fracture and knee effusion. Prepatellar soft tissue swelling. Electronically Signed   By: Ryan Salvage M.D.   On: 01/16/2024 12:07   DG Femur Min 2 Views Left Result Date: 01/16/2024 CLINICAL DATA:  Fall, hip and knee pain EXAM: LEFT FEMUR 2 VIEWS COMPARISON:  None Available. FINDINGS: Atheromatous vascular calcifications. Knee effusion observed with a transverse patellar fracture. Cross-table lateral view of the proximal femur severely suboptimal related to patient mobility and body habitus issues. IMPRESSION: 1. Transverse patellar fracture with knee effusion. 2. Atheromatous vascular calcifications. 3. Severely suboptimal cross-table lateral view of the proximal femur related to patient mobility and body habitus issues. Electronically Signed   By: Ryan Salvage M.D.   On: 01/16/2024 12:02   DG Pelvis Portable Result Date: 01/16/2024 CLINICAL DATA:  Fall, left hip pain EXAM: PORTABLE PELVIS 1-2 VIEWS COMPARISON:  None Available. FINDINGS: Limited assessment due to patient body habitus and difficulty positioning related to mobility and pain. In particular, there is poor definition of portions of the left iliac bone and left hemipelvis. The  pelvis is mildly rotated to the left. No overt cortical discontinuity characteristic fracture is identified. IMPRESSION: 1. Limited assessment due to patient body habitus and difficulty positioning related to mobility and pain. In particular, there is poor definition of portions of the left iliac bone and left hemipelvis. No overt cortical discontinuity characteristic of fracture is identified. Electronically Signed   By: Ryan Salvage M.D.   On: 01/16/2024 12:00   DG Chest Portable 1 View Result Date: 01/16/2024 CLINICAL DATA:  Fall during transport to bedside commode. History of patellar fracture due to a prior fall. EXAM: PORTABLE CHEST 1 VIEW COMPARISON:  12/05/2019 FINDINGS: New masslike density projecting over the lingula measuring proximally 9.6 by 7.7 cm. Malignancy is not excluded and chest CT (with contrast if feasible) is recommended for further characterization. Emphysema noted. Mild enlargement of the cardiopericardial silhouette. Aortic atherosclerosis noted. Airway thickening is present,  suggesting bronchitis or reactive airways disease. IMPRESSION: 1. New masslike density projecting over the lingula measuring 9.6 x 7.7 cm. Malignancy such as lung cancer is not excluded and chest CT (with contrast if feasible) is recommended for further characterization. 2. Airway thickening is present, suggesting bronchitis or reactive airways disease. 3. Mild enlargement of the cardiopericardial silhouette. 4. Aortic Atherosclerosis (ICD10-I70.0) and Emphysema (ICD10-J43.9). Electronically Signed   By: Ryan Salvage M.D.   On: 01/16/2024 11:59        Scheduled Meds:  amLODipine   5 mg Oral Daily   azithromycin   250 mg Oral Daily   bacitracin    Topical BID   fluticasone  furoate-vilanterol  1 puff Inhalation Daily   ipratropium-albuterol   3 mL Nebulization TID   levothyroxine   200 mcg Oral QAC breakfast   nicotine   14 mg Transdermal Daily   pantoprazole  (PROTONIX ) IV  40 mg Intravenous Q12H    senna-docusate  1 tablet Oral BID   Continuous Infusions:     LOS: 2 days    Time spent: 40 minutes    Toribio Hummer, MD [facility]   To contact the attending provider between 7A-7P or the covering provider during after hours 7P-7A, please log into the web site [email or web address] and access using universal [place] password for that web site. If you do not have the password, please call the hospital operator.  01/18/2024, 10:14 AM

## 2024-01-18 NOTE — Plan of Care (Signed)
 Matthew [name] continues to be impulsive and have severely impaired memory.  He continually squirms, wiggles, tries to sit on the side of the bed, etc., none of which affords him any comfort and all of which contributes to him needing to be repositioned to the head of the bed, causing him further pain and discomfort.  He continues to forget what his Primafit is for and pulls it off, then urinates all over himself and the bed.  His memory impairment makes reorientation apparently ineffective.  Per Dr. Beverley, will go to OR tomorrow for repair of his left patella and revision of his left shoulder.

## 2024-01-18 NOTE — Progress Notes (Signed)
   01/18/24 1232  Spiritual Encounters  Type of Visit Initial  Care provided to: Patient  Referral source Physician  Reason for visit  (Palliative)   Per referral from Dr. Jeryl, pallliative care, I attempted to offer spiritual care support to Mr. [employer].  At time of visit Mr. Gentle was sitting up in bed and finishing lunch. He had been asking RN for arm massage and asked this chaplain to continue same. He was restless and seemed uncomfortable.  I was unable to engage Mr. Elie in conversation but offered empathic presence for his discomfort. Spiritual care team will continue to follow and connect with daughters who may need support.  Anav Lammert L. Delores HERO.Div [phone], page

## 2024-01-19 ENCOUNTER — Encounter (HOSPITAL_COMMUNITY): Payer: Self-pay | Admitting: Anesthesiology

## 2024-01-19 ENCOUNTER — Encounter (HOSPITAL_COMMUNITY): Payer: Self-pay | Admitting: Family Medicine

## 2024-01-19 ENCOUNTER — Encounter (HOSPITAL_COMMUNITY)
Admission: EM | Disposition: A | Discharge status: Hospice | Payer: Self-pay | Source: Home / Self Care | Attending: Internal Medicine

## 2024-01-19 DIAGNOSIS — J449 Chronic obstructive pulmonary disease, unspecified: Secondary | ICD-10-CM | POA: Diagnosis not present

## 2024-01-19 DIAGNOSIS — S82002K Unspecified fracture of left patella, subsequent encounter for closed fracture with nonunion: Secondary | ICD-10-CM | POA: Diagnosis not present

## 2024-01-19 DIAGNOSIS — K922 Gastrointestinal hemorrhage, unspecified: Secondary | ICD-10-CM | POA: Diagnosis not present

## 2024-01-19 DIAGNOSIS — S82002D Unspecified fracture of left patella, subsequent encounter for closed fracture with routine healing: Secondary | ICD-10-CM | POA: Diagnosis not present

## 2024-01-19 DIAGNOSIS — E785 Hyperlipidemia, unspecified: Secondary | ICD-10-CM | POA: Diagnosis not present

## 2024-01-19 DIAGNOSIS — Z515 Encounter for palliative care: Secondary | ICD-10-CM

## 2024-01-19 DIAGNOSIS — Z7189 Other specified counseling: Secondary | ICD-10-CM | POA: Diagnosis not present

## 2024-01-19 DIAGNOSIS — R918 Other nonspecific abnormal finding of lung field: Secondary | ICD-10-CM | POA: Diagnosis not present

## 2024-01-19 DIAGNOSIS — R531 Weakness: Secondary | ICD-10-CM

## 2024-01-19 LAB — BASIC METABOLIC PANEL WITH GFR
Anion gap: 17 — ABNORMAL HIGH (ref 5–15)
BUN: 25 mg/dL — ABNORMAL HIGH (ref 8–23)
CO2: 27 mmol/L (ref 22–32)
Calcium: 8.1 mg/dL — ABNORMAL LOW (ref 8.9–10.3)
Chloride: 89 mmol/L — ABNORMAL LOW (ref 98–111)
Creatinine, Ser: 1.22 mg/dL (ref 0.61–1.24)
GFR, Estimated: >60 mL/min (ref >60)
Glucose, Bld: 134 mg/dL — ABNORMAL HIGH (ref 70–99)
Potassium: 3.7 mmol/L (ref 3.5–5.1)
Sodium: 133 mmol/L — ABNORMAL LOW (ref 135–145)

## 2024-01-19 LAB — CBC
HCT: 31.5 % — ABNORMAL LOW (ref 39.0–52.0)
Hemoglobin: 9.3 g/dL — ABNORMAL LOW (ref 13.0–17.0)
MCH: 27.8 pg (ref 26.0–34.0)
MCHC: 29.5 g/dL — ABNORMAL LOW (ref 30.0–36.0)
MCV: 94.3 fL (ref 80.0–100.0)
Platelets: 529 K/uL — ABNORMAL HIGH (ref 150–400)
RBC: 3.34 MIL/uL — ABNORMAL LOW (ref 4.22–5.81)
RDW: 16 % — ABNORMAL HIGH (ref 11.5–15.5)
WBC: 12 K/uL — ABNORMAL HIGH (ref 4.0–10.5)
nRBC: 0 % (ref 0.0–0.2)

## 2024-01-19 SURGERY — OPEN REDUCTION INTERNAL FIXATION (ORIF) PATELLA
Anesthesia: Choice | Site: Shoulder | Laterality: Left

## 2024-01-19 MED ORDER — DEXAMETHASONE SODIUM PHOSPHATE 10 MG/ML IJ SOLN
INTRAMUSCULAR | Status: AC
  Filled 2024-01-19: qty 3

## 2024-01-19 MED ORDER — PROPOFOL 10 MG/ML IV BOLUS
INTRAVENOUS | Status: AC
Start: 2024-01-19 — End: 2024-01-19
  Filled 2024-01-19: qty 20

## 2024-01-19 MED ORDER — PANTOPRAZOLE SODIUM 40 MG PO TBEC
40.0000 mg | DELAYED_RELEASE_TABLET | Freq: Two times a day (BID) | ORAL | Status: DC
  Administered 2024-01-19: 40 mg via ORAL
  Filled 2024-01-19: qty 1

## 2024-01-19 MED ORDER — LIDOCAINE HCL 1 % IJ SOLN
INTRAMUSCULAR | Status: AC
  Filled 2024-01-19: qty 20

## 2024-01-19 MED ORDER — PROPOFOL 10 MG/ML IV BOLUS
INTRAVENOUS | Status: AC
  Filled 2024-01-19: qty 20

## 2024-01-19 MED ORDER — LIDOCAINE HCL (PF) 2 % IJ SOLN
INTRAMUSCULAR | Status: AC
  Filled 2024-01-19: qty 15

## 2024-01-19 MED ORDER — HALOPERIDOL 2 MG PO TABS: 1.0000 mg | ORAL_TABLET | ORAL | Status: DC | PRN

## 2024-01-19 MED ORDER — HALOPERIDOL LACTATE 5 MG/ML IJ SOLN
1.0000 mg | INTRAMUSCULAR | Status: DC | PRN
  Administered 2024-01-19 – 2024-01-20 (×5): 1 mg via INTRAVENOUS
  Filled 2024-01-19 (×5): qty 1

## 2024-01-19 MED ORDER — HALOPERIDOL LACTATE 2 MG/ML PO CONC: 1.0000 mg | ORAL | Status: DC | PRN

## 2024-01-19 MED ORDER — ONDANSETRON HCL 4 MG/2ML IJ SOLN
INTRAMUSCULAR | Status: AC
  Filled 2024-01-19: qty 6

## 2024-01-19 NOTE — Progress Notes (Signed)
 Matthew [name] [age] [facility] Patient   [name] is a current hospice patient followed at home for terminal diagnosis of Emphysema.  Patient experienced an unwitnessed fall at his home and was brought to [facility] ED for further evaluation.  Patient was admitted on 9.13.25 with diagnosis of Left Patella fracture and upper GI bleed.  Per Dr. Odella Pepper, hospice physician, this is a related hospitalization.  Visited patient at bedside.  Assisted nurse tech with repositioning the patient.  He appears uncomfortable and agitated.  RN reports agitation is not managed and redirection is not effective.  Medication changes have been requested.  Family is looking at different facilities for placement as he is unable to return home. Physician was unable to set patient's dislocated shoulder and will re-attempt this afternoon.  Patient is appropriate for GIP level of care requiring IV medications to manage pain in left patella due to fracture after a fall.   Vital Signs: 106/16  142/63      93% on 4 lpm [place]   I/O 550 output Abnormal Labs: 01/19/24  Sodium: 133 (L) Chloride: 89 (L) BUN: 25 (H) Calcium : 8.1 (L) WBC: 12 (H) Hemoglobin: 9.3 (L) HCT: 31.5 (L) Platelets: 529 (H)   IV Meds/PRN: Protonix  40mg  IV BID Morphine  3mg  IV every 3 hours prn x5 Oxycodone  5mg  PO x1 Haldol  0.5mg  IV x4   Diagnostics: No new imaging on 01/19/24  Assessment and plan per Dr. 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 [place]   Controlled type 2 diabetes mellitus without complication, without [name]-term current use of insulin  (HCC)   Essential hypertension   Chronic diastolic CHF (congestive heart failure) (HCC)   Hypothyroidism s/p thyroidectomy due to thyroid   cancer   Hyperlipidemia   Memory loss   OSA (obstructive sleep apnea)   Tobacco use   Palliative care encounter   ACP (advance care planning)   DNR (do not resuscitate)   Shortness of breath   Pain   Medication management   High risk medication use   #1 left transverse patella fracture -Secondary to mechanical fall and suffering a left displaced patella fracture on 01/14/2024 who fell again at home on his knees and unable to bear weight or perform ADLs and as such brought back to the ED. - Imaging done consistent with nondisplaced left patella fracture with effusion. - Patient noted to be unable to bear weight at home. - Patient seen in consultation by orthopedics, Dr. Beverley who has ordered a CT scan of the knee, for now recommending conservative management keeping his leg extended and in a knee immobilizer and orthopedics recommending reimaging it later on this week and if showing interval displacement may consider ORIF. - Bledsoe locked in extension ordered per orthopedics. - PT/OT. - WBAT when locked in extension with PT. - Per orthopedics.   2.  Lung mass -Noted on chest x-ray. - Patient has a history of hemoptysis, ongoing tobacco use, mass concerning for cancer. - CT chest with 8.3 x 6.5 x 7.2 cm left upper lobe mass partially abutting the mildly displacing the minor fissure and also the lateral and cardiac pleural margins.  Mass associated with truncation obstruction of the anterior segment bronchus of the left upper lobe.  Adjacent postobstructive atelectasis or pneumonitis.  Top differential primary lung malignancy.  Enlarged left hilar and left infrahilar lymph nodes.  Suspected gallstone in the neck of gallbladder 2.8 cm diameter.  Airway thickening suggesting bronchitis or reactive airways disease.  -Patient seen in consultation by palliative care will discuss goals of care with the family and decision made to transition to full comfort measures.   - Patient noted to be on  hospice prior to admission and would likely be discharged with hospice.   - Symptom management as per palliative care.    3.  Normocytic anemia/??UGIB -FOBT noted to be positive. Patient noted with hemoptysis and black stools, may be swallowing-hemoptysis. -Anemia panel with iron level of 20, TIBC of 302, ferritin of 71. - Hemoglobin currently stable at 9.4. - Advance to regular diet. - Follow H&H. - Continue IV PPI every 12 hours. - GI consulted and patient seen in consultation by GI who feel patient at this point in time is not a candidate for any endoscopic procedures due to his end-stage COPD.  Patient also being transition to comfort measures.   - GI recommending IV iron possibly in transfusion as needed.   - Follow H&H   4.  CKD stage II - Patient noted with a creatinine of 1.5 on presentation. -Urinalysis leukocytes negative, nitrite negative, protein of 100. - Renal function improved creatinine 1.04. - Continue gentle hydration.   5.  End-stage COPD/chronic respiratory failure on 4 L nasal cannula/probable bronchitis -Patient noted to be on hospice for end-stage COPD. - Currently on baseline home O2. - Continue scheduled DuoNeb. - Patient with ongoing tobacco. - Continue Breo Ellipta . - Continue Z-Pak. - Morphine  as needed. - Palliative care consulted for goals of care and I will follow her and decision made to transition to full comfort measures..   6.  Controlled type 2 diabetes mellitus - Hemoglobin A1c 5.8 - Continue to hold oral hypoglycemic agents. - SSI and CBGs discontinued.     7.  Hypertension - Continue to hold lisinopril , Lasix, metolazone.   - Hydralazine  as needed.   8.  Chronic diastolic CHF -2D echo from 2021 with normal EF, grade 1 DD. - Patient not volume overloaded on examination. - Continue to hold Lasix and metolazone. - Continue to hold lisinopril .   9.  Hypothyroidism status post 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 -TSH noted at 6.350. -  T4 at 1.09. - Synthroid .    10.  Hyperlipidemia - No longer on statin.   11.  Memory loss  - Patient with no formal history or diagnosis of dementia per family but had significant short-term memory loss. - Continue delirium precautions. - TOC consulted for placement.   12.  OSA -Patient noncompliant with CPAP. - Patient noted to have refused CPAP nightly.   13.  Tobacco use -Patient with no desire for tobacco cessation. - Continue nicotine  patch.     #14 dislocated left shoulder -Patient currently in sling. - Orthopedics following.   Discharge Planning- ongoing.  Family is deciding on placement Goals of Care- clear, patient is DNR IDT- updated Family Contact:  spoke with daughter [name] by phone   Hospice Detail Summary Report set for upload into patient's records on 9.13.25 by Nat Babe, [facility] Nurse Liaison   Please do not hesitate to call with any hospice related questions or concerns. When patient is ready for discharge, please use [facility] as we contract this service for our patients.    Inocente Jacobs, BSN [facility]  Nurse Liaison [phone]

## 2024-01-19 NOTE — TOC Progression Note (Signed)
 Transition of Care [facility]) - Progression Note    Patient Details  Name: Matthew [name] MRN: [id number] Date of Birth: [birth year]  [facility] CM/SW Contact  Tawni CHRISTELLA Eva, LCSW Phone Number: 01/19/2024, 12:40 PM  Clinical Narrative:     CSW spoke with the pt's daughter, [name], to discuss the process of changing hospice agencies. [name] reported that she is still in discussions with the pt's family. CSW is awaiting a call back regarding whether she would like a referral made to a different hospice agency. IP care management to follow.    Expected Discharge Plan: Home w Hospice Care Barriers to Discharge: Continued Medical Work up               Expected Discharge Plan and Services                                               Social Drivers of Health (SDOH) Interventions SDOH Screenings   Food Insecurity: No Food Insecurity (01/16/2024)  Housing: Low Risk  (01/16/2024)  Transportation Needs: No Transportation Needs (01/16/2024)  Utilities: Not At Risk (01/16/2024)  Social Connections: Socially Isolated (01/16/2024)  Tobacco Use: High Risk (01/19/2024)    Readmission Risk Interventions     No data to display

## 2024-01-19 NOTE — Progress Notes (Signed)
 Patient continues to be agitated, irritable and restless despite nursing efforts to reduce agitation and decrease stimulation. He is not redirectable and continues to try and climb out of bed and be verbally aggressive to staff. MD was made aware, see new orders.

## 2024-01-19 NOTE — Plan of Care (Signed)
   Problem: Education: Goal: Ability to describe self-care measures that may prevent or decrease complications (Diabetes Survival Skills Education) will improve Outcome: Progressing Goal: Individualized Educational Video(s) Outcome: Progressing   Problem: Coping: Goal: Ability to adjust to condition or change in health will improve Outcome: Progressing

## 2024-01-19 NOTE — Progress Notes (Signed)
 Orthopedic Tech Progress Note Patient Details:  Matthew [name] August 28, 1945 [id number]  Ortho Devices Type of Ortho Device: Shoulder immobilizer Ortho Device/Splint Location: LUE/ Delivered to room. Nurse states she will apply sling Ortho Device/Splint Interventions: Ordered   Post Interventions Patient Tolerated: [name] 01/19/2024, 11:06 AM

## 2024-01-19 NOTE — Progress Notes (Signed)
 PROGRESS NOTE    Matthew [name]  FMW:[id number] DOB: Jun 23, 1945 DOA: 01/16/2024 PCP: [name], MD   Chief Complaint  Patient presents with   Fall    Brief Narrative:  Patient 78 year old gentleman history of type 2 diabetes, hypertension, hyperlipidemia, end-stage COPD/chronic respiratory failure on home O2, CKD stage II, history of thyroid  cancer, OSA noncompliant with CPAP, history of ICH due to hypertension presenting to the ED after a fall with inability to bear weight or ambulate.  Patient seen in the ED few days prior to admission noted to have a fractured patella and sent home with outpatient follow-up with orthopedics.  Patient presented back as unable to tolerate his brace and unable to take care of himself and bear weight.  Also noted was concerns for possible melanotic stools and diarrhea.  Patient also with some concerns of some hemoptysis.  Chest x-ray done concerning for pulmonary mass.  FOBT noted to be positive.  CT chest done for further evaluation of pulmonary mass.  Orthopedics, GI, palliative care consulted. Palliative care met with family and decision made to transition to full comfort measures.   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 [place]   Controlled type 2 diabetes mellitus without complication, without long-term current use of insulin  (HCC)   Essential hypertension   Chronic diastolic CHF (congestive heart failure) (HCC)   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   Hyperlipidemia   Memory loss   OSA (obstructive sleep apnea)   Tobacco use   Palliative care encounter   ACP (advance care planning)   DNR (do not resuscitate)   Shortness of breath   Pain   Medication management   High risk medication use  #1 left transverse patella  fracture -Secondary to mechanical fall and suffering a left displaced patella fracture on 01/14/2024 who fell again at home on his knees and unable to bear weight or perform ADLs and as such brought back to the ED. - Imaging done consistent with nondisplaced left patella fracture with effusion. - Patient noted to be unable to bear weight at home. - Patient seen in consultation by orthopedics, Dr. Beverley who has ordered a CT scan of the knee, for now recommending conservative management keeping his leg extended and in a knee immobilizer and orthopedics recommending reimaging it later on this week and if showing interval displacement may consider ORIF. -Per orthopedics CT scan no longer needed and as such we will discontinue CT scan of the knee. - Bledsoe locked in extension ordered per orthopedics. - PT/OT. - WBAT when locked in extension with PT. - Per orthopedics.  2.  Lung mass -Noted on chest x-ray. - Patient has a history of hemoptysis, ongoing tobacco use, mass concerning for cancer. - CT chest with 8.3 x 6.5 x 7.2 cm left upper lobe mass partially abutting the mildly displacing the minor fissure and also the lateral and cardiac pleural margins.  Mass associated with truncation obstruction of the anterior segment bronchus of the left upper lobe.  Adjacent postobstructive atelectasis or pneumonitis.  Top differential primary lung malignancy.  Enlarged left hilar and left infrahilar lymph nodes.  Suspected gallstone in the neck of gallbladder 2.8 cm diameter.  Airway thickening suggesting bronchitis or reactive airways disease.  -Patient seen in consultation  by palliative care will discuss goals of care with the family and decision made to transition to full comfort measures.   - Patient noted to be on hospice prior to admission and would likely be discharged with hospice.   - Symptom management as per palliative care.   3.  Normocytic anemia/??UGIB -FOBT noted to be positive. Patient noted  with hemoptysis and black stools, may be swallowing-hemoptysis. -Anemia panel with iron level of 20, TIBC of 302, ferritin of 71. - Hemoglobin currently stable at 9.3. - Tolerating regular diet. - Follow H&H. - Change IV PPI to oral PPI twice daily.  - GI consulted and patient seen in consultation by GI who feel patient at this point in time is not a candidate for any endoscopic procedures due to his end-stage COPD.  Patient also being transition to comfort measures.   - GI recommending IV iron possibly and transfusion as needed.   - Follow H&H  4.  CKD stage II - Patient noted with a creatinine of 1.5 on presentation. -Urinalysis leukocytes negative, nitrite negative, protein of 100. - Renal function improved creatinine 1.22. - Saline lock IV fluids.    5.  End-stage COPD/chronic respiratory failure on 4 L nasal cannula/probable bronchitis -Patient noted to be on hospice for end-stage COPD. - Currently on baseline home O2. - Continue scheduled DuoNeb. - Patient with ongoing tobacco. - Continue Breo Ellipta . - Continue Z-Pak. - Morphine  as needed. - Palliative care consulted for goals of care and met with family and decision made to transition to full comfort measures.    6.  Controlled type 2 diabetes mellitus - Hemoglobin A1c 5.8 - Continue to hold oral hypoglycemic agents. - SSI and CBGs discontinued.    7.  Hypertension - BP currently controlled.   -Continue to hold lisinopril , Lasix, metolazone.   - Hydralazine  as needed.  8.  Chronic diastolic CHF -2D echo from 2021 with normal EF, grade 1 DD. - Patient not volume overloaded on examination. - Continue to hold Lasix and metolazone and lisinopril .  9.  Hypothyroidism status post 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 -TSH noted at 6.350. - T4 at 1.09. - Continue Synthroid .   10.  Hyperlipidemia - No longer on statin.  11.  Memory loss  - Patient with no formal history or diagnosis of dementia per family but had  significant short-term memory loss. - Continue delirium precautions. - TOC consulted for placement.  12.  OSA -Patient noncompliant with CPAP. - Patient noted to have refused CPAP nightly.  13.  Tobacco use -Patient with no desire for tobacco cessation. - Nicotine  patch.    #14 dislocated left shoulder -Patient currently in sling. - Orthopedics attempted to reduce dislocation however unsuccessful per RN.  -Shoulder immobilizer ordered per orthopedics.  DVT prophylaxis: SCDs Code Status: DNR Family Communication: Updated patient, no family at bedside. Disposition: Patient with unsafe disposition.  ??  Home with hospice.  TBD  Status is: Inpatient Remains inpatient appropriate because: Severity of illness   Consultants:  Orthopedics: Dr. Beverley 01/17/2023 Palliative care: Dr. Clayton 01/17/2024 GI: Dr. Wilhelmenia 01/18/2024  Procedures:  Chest x-ray 01/16/2024 Painful to the left femur 01/16/2024 Plain films of the left knee 01/16/2024 Plain films of the pelvis 01/16/2024 CT chest 01/17/2024   Antimicrobials:  Anti-infectives (From admission, onward)    Start     Dose/Rate Route Frequency Ordered Stop   01/18/24 1000  azithromycin  (ZITHROMAX ) tablet 250 mg       Placed in Followed by Linked  Group   250 mg Oral Daily 01/17/24 0838 01/22/24 0959   01/17/24 1000  azithromycin  (ZITHROMAX ) tablet 500 mg       Placed in Followed by Linked Group   500 mg Oral Daily 01/17/24 0838 01/17/24 0934         Subjective: Sitting up in bed.  Complains of left shoulder pain.  Some complaints of left knee pain but feels doing fine for now.  Denies any significant shortness of breath.  No chest pain.  Getting ready to eat lunch.  It is noted by RN that orthopedics attempted to reduce left shoulder dislocation however unsuccessful.  Objective: Vitals:   01/18/24 1352 01/18/24 2145 01/19/24 0757 01/19/24 0848  BP: 131/69 132/68  (!) 142/63  Pulse: (!) 109 (!) 103  (!) 106  Resp: 18 20     Temp: 98.3 F (36.8 C) 98.4 F (36.9 C)    TempSrc:  Oral    SpO2: (!) 84% 96% 97% 93%  Weight:      Height:        Intake/Output Summary (Last 24 hours) at 01/19/2024 1258 Last data filed at 01/19/2024 0800 Gross per 24 hour  Intake --  Output 550 ml  Net -550 ml   Filed Weights   01/16/24 1032 01/16/24 2001  Weight: 98.9 kg 105.5 kg    Examination:  General exam: NAD. Respiratory system: CTAB anterior lung fields.  No wheezes, no crackles, no rhonchi.  Fair air movement.  Speaking in full sentences.  No use of accessory muscles of respiration.  Cardiovascular system: RRR no murmurs rubs or gallops.  No JVD.  No pitting lower extremity edema.  Gastrointestinal system: Abdomen is soft, obese, nontender to palpation, positive bowel sounds.  No rebound.  No guarding.  Central nervous system: Alert and oriented.  Moving extremities spontaneously.  No focal neurological deficits. Extremities: Left lower extremity in knee immobilizer.  Left upper extremity shoulder dislocation. Skin: No rashes, lesions or ulcers Psychiatry: Judgement and insight appear poor. Mood & affect appropriate.     Data Reviewed: I have personally reviewed following labs and imaging studies  CBC: Recent Labs  Lab 01/16/24 1044 01/16/24 1715 01/16/24 2336 01/17/24 0400 01/18/24 0853 01/19/24 0352  WBC 10.9* 11.7* 12.1* 11.2* 11.3* 12.0*  NEUTROABS 8.8*  --   --   --  8.9*  --   HGB 9.5* 9.2* 8.9* 9.1* 9.4* 9.3*  HCT 31.6* 31.0* 30.0* 29.5* 30.9* 31.5*  MCV 94.9 96.0 95.2 95.5 94.8 94.3  PLT 471* 484* 472* 449* 458* 529*    Basic Metabolic Panel: Recent Labs  Lab 01/16/24 1044 01/17/24 0625 01/18/24 0853 01/19/24 0352  NA 137 134* 133* 133*  K 3.5 3.7 3.6 3.7  CL 91* 90* 91* 89*  CO2 34* 29 31 27   GLUCOSE 138* 127* 134* 134*  BUN 34* 29* 28* 25*  CREATININE 1.51* 1.21 1.04 1.22  CALCIUM  7.5* 7.6* 7.8* 8.1*    GFR: Estimated Creatinine Clearance: 61.7 mL/min (by C-G formula based  on SCr of 1.22 mg/dL).  Liver Function Tests: Recent Labs  Lab 01/16/24 1044  AST 25  ALT 16  ALKPHOS 110  BILITOT 0.2  PROT 6.5  ALBUMIN 3.1*    CBG: Recent Labs  Lab 01/16/24 2002 01/17/24 0733 01/17/24 1308 01/17/24 1628 01/17/24 2054  GLUCAP 167* 135* 168* 128* 149*     No results found for this or any previous visit (from the past 240 hours).  Radiology Studies: DG Knee 1-2 Views Left Result Date: 01/18/2024 CLINICAL DATA:  Patellar fracture. EXAM: LEFT KNEE - 1-2 VIEW COMPARISON:  01/16/2024. FINDINGS: Similar alignment of a transverse patellar fracture. Knee joint effusion. Prepatellar soft tissue swelling. External brace in place. IMPRESSION: Similar alignment of a transverse patellar fracture with knee joint effusion. Electronically Signed   By: Harrietta Sherry M.D.   On: 01/18/2024 11:24   DG Shoulder Left Result Date: 01/18/2024 CLINICAL DATA:  Dislocation. EXAM: LEFT SHOULDER - 2+ VIEW COMPARISON:  None Available. FINDINGS: Anterior dislocation of the left humeral head relative to the glenoid. Mild contour flattening of the superolateral humeral head could reflect a Hill-Sachs defect. The acromioclavicular joint is anatomically aligned with mild-to-moderate degenerative arthropathy. IMPRESSION: 1. Anterior dislocation of the left glenohumeral joint. Mild contour flattening of the superolateral humeral head could reflect a Hill-Sachs defect. 2. Mild-to-moderate degenerative arthropathy of the acromioclavicular joint. Electronically Signed   By: Harrietta Sherry M.D.   On: 01/18/2024 11:21        Scheduled Meds:  amLODipine   5 mg Oral Daily   azithromycin   250 mg Oral Daily   bacitracin    Topical BID   fluticasone  furoate-vilanterol  1 puff Inhalation Daily   ipratropium-albuterol   3 mL Nebulization TID   levothyroxine   200 mcg Oral QAC breakfast   nicotine   14 mg Transdermal Daily   pantoprazole  (PROTONIX ) IV  40 mg Intravenous Q12H    senna-docusate  1 tablet Oral BID   Continuous Infusions:     LOS: 3 days    Time spent: 35 minutes    Toribio Hummer, MD [facility]   To contact the attending provider between 7A-7P or the covering provider during after hours 7P-7A, please log into the web site [email or web address] and access using universal [place] password for that web site. If you do not have the password, please call the hospital operator.  01/19/2024, 12:58 PM

## 2024-01-19 NOTE — Progress Notes (Signed)
 Daily Progress Note   Patient Name: Matthew [name]       Date: 01/19/2024 DOB: [birth year]  Age: 78 y.o. MRN#: [id number] Attending Physician: [name], MD Primary Care Physician: [name], MD Admit Date: 01/16/2024  Reason for Consultation/Follow-up: Establishing goals of care  Subjective: Resting in bed, appears restless, appears uncomfortable.  Call placed and discussed with daughter [name].   Length of Stay: 3  Current Medications: Scheduled Meds:   amLODipine   5 mg Oral Daily   azithromycin   250 mg Oral Daily   bacitracin    Topical BID   fluticasone  furoate-vilanterol  1 puff Inhalation Daily   ipratropium-albuterol   3 mL Nebulization TID   levothyroxine   200 mcg Oral QAC breakfast   nicotine   14 mg Transdermal Daily   pantoprazole  (PROTONIX ) IV  40 mg Intravenous Q12H   senna-docusate  1 tablet Oral BID    Continuous Infusions:   PRN Meds: acetaminophen  **OR** acetaminophen , antiseptic oral rinse, artificial tears, glycopyrrolate  **OR** glycopyrrolate  **OR** glycopyrrolate , haloperidol  **OR** haloperidol  **OR** haloperidol  lactate, hydrALAZINE , ipratropium-albuterol , morphine  injection, ondansetron  **OR** ondansetron  (ZOFRAN ) IV, oxyCODONE   Physical Exam         Elderly appearing gentleman resting in bed Awake alert answers a few questions Appears tachycardic Appears to have increased work of breathing Is on supplemental oxygen  via nasal cannula Chronically ill-appearing Appears restless Abdomen is obese but soft  Vital Signs: BP (!) 142/63   Pulse (!) 106   Temp 98.4 F (36.9 C) (Oral)   Resp 20   Ht 5' 10 (1.778 m)   Wt 105.5 kg   SpO2 93%   BMI 33.37 kg/m  SpO2: SpO2: 93 % O2 Device: O2 Device: Nasal Cannula O2 Flow Rate: O2 Flow  Rate (L/min): 4 L/min  Intake/output summary:  Intake/Output Summary (Last 24 hours) at 01/19/2024 1312 Last data filed at 01/19/2024 0800 Gross per 24 hour  Intake --  Output 550 ml  Net -550 ml   LBM: Last BM Date : 01/17/24 Baseline Weight: Weight: 98.9 kg Most recent weight: Weight: 105.5 kg       Palliative Assessment/Data:      Patient Active Problem List   Diagnosis Date Noted   Palliative care encounter 01/17/2024   ACP (advance care planning) 01/17/2024   DNR (do not resuscitate) 01/17/2024  Shortness of breath 01/17/2024   Pain 01/17/2024   Medication management 01/17/2024   High risk medication use 01/17/2024   Essential hypertension 01/16/2024   Memory loss 01/16/2024   left transverse patellar fracture 01/16/2024   Lung mass 01/16/2024   Normocytic anemia 01/16/2024   OSA (obstructive sleep apnea) 01/16/2024   Upper GI bleed 01/16/2024   Chronic diastolic CHF (congestive heart failure) (HCC) 01/16/2024   Body mass index (BMI) 34.0-34.9, adult 09/24/2021   History of thyroidectomy 09/24/2021   Personal history of malignant neoplasm of thyroid  09/24/2021   Type 2 diabetes mellitus with hyperglycemia (HCC) 09/24/2021   Poor sleep hygiene 06/19/2020   Panlobular emphysema (HCC) 06/19/2020   Tobacco use 06/19/2020   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) 06/19/2020   At risk for sleep apnea 06/19/2020   OSA and COPD overlap syndrome (HCC) 06/19/2020   Chronic intermittent hypoxia with obstructive sleep apnea 06/19/2020   Hypertensive emergency 12/07/2019   Hyperlipidemia 12/07/2019   Controlled type 2 diabetes mellitus without complication, without long-term current use of insulin  (HCC) 12/07/2019   Cognitive decline 12/07/2019   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 [place] 12/07/2019   CKD (chronic kidney disease), stage II 12/07/2019   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 12/07/2019   Polycythemia  secondary to smoking 12/07/2019   ICH (intracerebral hemorrhage) (HCC) - L parietal hypertensive hmg 12/05/2019   Encephalopathy acute 10/19/2018    Palliative Care Assessment & Plan   Patient Profile: 78 year old gentleman with history of cognitive impairment diabetes hypertension hyperlipidemia end-stage COPD receiving [facility] hospice support at home, stage II chronic kidney disease history of thyroid  cancer OSA not on CPAP history of ICH due to hypertension, history of chronic respiratory failure on supplemental oxygen  via nasal cannula Home hospice patient Admitted after a fall Found to have a left transverse patellar fracture Possible concern for upper GI bleed Found to have newly diagnosed lung mass on imaging  Assessment: Functional decline Shortness of breath Hospice patient   Recommendations/Plan: Initial palliative care consult note reviewed, agree with DNR/comfort measures.  Patient already followed by [facility] hospice Medication history reviewed, around 27 mg IV Morphine  required in the past 24 hours.  Call placed and discussed with daughter [name] - family prefers long term care with hospice at this point, they wish to continue current mode of care. PMT to follow along.  Code Status:    Code Status Orders  (From admission, onward)           Start     Ordered   01/17/24 1607  Do not attempt resuscitation (DNR) - Comfort care  Continuous       Question Answer Comment  If patient has no pulse and is not breathing Do Not Attempt Resuscitation   In Pre-Arrest Conditions (Patient Is Breathing and Has a Pulse) Provide comfort measures. Relieve any mechanical airway obstruction. Avoid transfer unless required for comfort.   Consent: Discussion documented in EHR or advanced directives reviewed      01/17/24 1606           Code Status History     Date Active Date Inactive Code Status Order ID Comments User Context   01/16/2024 1828 01/17/2024 1606 Limited: Do not attempt  resuscitation (DNR) -DNR-LIMITED -Do Not Intubate/DNI  [id number]  Waddell Rake, MD ED   12/05/2019 1732 12/07/2019 1738 Full Code [id number]  Michaela Aisha SQUIBB, MD ED   10/19/2018 1841 10/24/2018 1731 Full Code [id number]  Loreta Levels,  MD ED      Advance Directive Documentation    Flowsheet Row Most Recent Value  Type of Advance Directive Living will  Pre-existing out of facility DNR order (yellow form or pink MOST form) --  MOST Form in Place? --    Prognosis:  Likely 2-4 weeks.   Discharge Planning: Long term care with hospice.   Care plan was discussed with patient, various members of the interdisciplinary team including RN and [facility] MD as well as discussed with the daughter on the phone.   Thank you for allowing the Palliative Medicine Team to assist in the care of this patient.high MDM     Greater than 50%  of this time was spent counseling and coordinating care related to the above assessment and plan.  Lonia Serve, MD  Please contact Palliative Medicine Team phone at [phone] for questions and concerns.

## 2024-01-19 NOTE — Plan of Care (Signed)
  Problem: Education: Goal: Ability to describe self-care measures that may prevent or decrease complications (Diabetes Survival Skills Education) will improve Outcome: Progressing Goal: Individualized Educational Video(s) Outcome: Progressing   Problem: Metabolic: Goal: Ability to maintain appropriate glucose levels will improve Outcome: Progressing   Problem: Nutritional: Goal: Maintenance of adequate nutrition will improve Outcome: Progressing   Problem: Education: Goal: Knowledge of General Education information will improve Description: Including pain rating scale, medication(s)/side effects and non-pharmacologic comfort measures Outcome: Progressing   Problem: Health Behavior/Discharge Planning: Goal: Ability to manage health-related needs will improve Outcome: Progressing

## 2024-01-20 DIAGNOSIS — Z515 Encounter for palliative care: Secondary | ICD-10-CM | POA: Diagnosis not present

## 2024-01-20 DIAGNOSIS — R918 Other nonspecific abnormal finding of lung field: Secondary | ICD-10-CM | POA: Diagnosis not present

## 2024-01-20 MED ORDER — HALOPERIDOL LACTATE 5 MG/ML IJ SOLN
1.0000 mg | Freq: Once | INTRAMUSCULAR | Status: AC
  Administered 2024-01-20: 1 mg via INTRAVENOUS
  Filled 2024-01-20: qty 1

## 2024-01-20 MED ORDER — PANTOPRAZOLE SODIUM 40 MG PO TBEC: 40.0000 mg | DELAYED_RELEASE_TABLET | Freq: Two times a day (BID) | ORAL | Status: AC

## 2024-01-20 MED ORDER — HALOPERIDOL LACTATE 5 MG/ML IJ SOLN: 2.0000 mg | Freq: Once | INTRAMUSCULAR | Status: DC

## 2024-01-20 MED ORDER — HALOPERIDOL LACTATE 2 MG/ML PO CONC: 1.0000 mg | ORAL | Status: AC | PRN

## 2024-01-20 MED ORDER — AMLODIPINE BESYLATE 5 MG PO TABS: 5.0000 mg | ORAL_TABLET | Freq: Every day | ORAL | Status: AC

## 2024-01-20 MED ORDER — ACETAMINOPHEN 325 MG PO TABS: 650.0000 mg | ORAL_TABLET | Freq: Four times a day (QID) | ORAL | Status: AC | PRN

## 2024-01-20 MED ORDER — CHLORHEXIDINE GLUCONATE CLOTH 2 % EX PADS
6.0000 | MEDICATED_PAD | Freq: Every day | CUTANEOUS | Status: DC
  Administered 2024-01-20: 6 via TOPICAL

## 2024-01-20 NOTE — Progress Notes (Signed)
 This nurse in room with patient at this time

## 2024-01-20 NOTE — TOC Transition Note (Signed)
 Transition of Care [facility]) - Discharge Note   Patient Details  Name: CLEMENCE STILLINGS MRN: [id number] Date of Birth: 04/14/1946  [facility]) CM/SW Contact:  Tawni CHRISTELLA Eva, LCSW Phone Number: 01/20/2024, 1:39 PM   Clinical Narrative:    Pt to d/c to [facility]. [facility] will arrange transportation. Discharge packet left at nurse station. IP care management sig off.    Final next level of care: Home w Hospice Care Barriers to Discharge: No Barriers Identified   Patient Goals and CMS Choice Patient states their goals for this hospitalization and ongoing recovery are:: [facility]          Discharge Placement                    Patient and family notified of of transfer: 01/20/24  Discharge Plan and Services Additional resources added to the After Visit Summary for                                       Social Drivers of Health (SDOH) Interventions SDOH Screenings   Food Insecurity: No Food Insecurity (01/16/2024)  Housing: Low Risk  (01/16/2024)  Transportation Needs: No Transportation Needs (01/16/2024)  Utilities: Not At Risk (01/16/2024)  Social Connections: Socially Isolated (01/16/2024)  Tobacco Use: High Risk (01/19/2024)     Readmission Risk Interventions     No data to display

## 2024-01-20 NOTE — Plan of Care (Signed)

## 2024-01-20 NOTE — Progress Notes (Signed)
 Daily Progress Note   Patient Name: Matthew [name]       Date: 01/20/2024 DOB: [birth year]  Age: 78 y.o. MRN#: [id number] Attending Physician: Dorinda Drue DASEN, MD Primary Care Physician: [name], MD Admit Date: 01/16/2024  Reason for Consultation/Follow-up: Establishing goals of care  Subjective: Resting in bed, appears restless, appears uncomfortable.  Discussed with son and grand daughter at bedside.   Length of Stay: 4  Current Medications: Scheduled Meds:   amLODipine   5 mg Oral Daily   azithromycin   250 mg Oral Daily   bacitracin    Topical BID   Chlorhexidine  Gluconate Cloth  6 each Topical Daily   fluticasone  furoate-vilanterol  1 puff Inhalation Daily   ipratropium-albuterol   3 mL Nebulization TID   levothyroxine   200 mcg Oral QAC breakfast   nicotine   14 mg Transdermal Daily   pantoprazole   40 mg Oral BID AC   senna-docusate  1 tablet Oral BID    Continuous Infusions:   PRN Meds: acetaminophen  **OR** acetaminophen , antiseptic oral rinse, artificial tears, glycopyrrolate  **OR** glycopyrrolate  **OR** glycopyrrolate , haloperidol  **OR** haloperidol  **OR** haloperidol  lactate, hydrALAZINE , ipratropium-albuterol , morphine  injection, ondansetron  **OR** ondansetron  (ZOFRAN ) IV, oxyCODONE   Physical Exam         Elderly appearing gentleman resting in bed Awake alert answers a few questions Appears tachycardic Appears to have increased work of breathing Is on supplemental oxygen  via nasal cannula Chronically ill-appearing Appears restless Abdomen is obese but soft  Vital Signs: BP 135/62 (BP Location: Right Arm)   Pulse (!) 112   Temp 97.9 F (36.6 C) (Oral)   Resp 18   Ht 5' 10 (1.778 m)   Wt 105.5 kg   SpO2 92%   BMI 33.37 kg/m  SpO2: SpO2: 92 % O2  Device: O2 Device: High Flow Nasal Cannula O2 Flow Rate: O2 Flow Rate (L/min): 11 L/min  Intake/output summary:  Intake/Output Summary (Last 24 hours) at 01/20/2024 1348 Last data filed at 01/20/2024 9380 Gross per 24 hour  Intake 220 ml  Output 1050 ml  Net -830 ml   LBM: Last BM Date : 01/17/24 Baseline Weight: Weight: 98.9 kg Most recent weight: Weight: 105.5 kg       Palliative Assessment/Data:      Patient Active Problem List   Diagnosis Date Noted   Palliative care encounter  01/17/2024   ACP (advance care planning) 01/17/2024   DNR (do not resuscitate) 01/17/2024   Shortness of breath 01/17/2024   Pain 01/17/2024   Medication management 01/17/2024   High risk medication use 01/17/2024   Essential hypertension 01/16/2024   Memory loss 01/16/2024   left transverse patellar fracture 01/16/2024   Lung mass 01/16/2024   Normocytic anemia 01/16/2024   OSA (obstructive sleep apnea) 01/16/2024   Upper GI bleed 01/16/2024   Chronic diastolic CHF (congestive heart failure) (HCC) 01/16/2024   Body mass index (BMI) 34.0-34.9, adult 09/24/2021   History of thyroidectomy 09/24/2021   Personal history of malignant neoplasm of thyroid  09/24/2021   Type 2 diabetes mellitus with hyperglycemia (HCC) 09/24/2021   Poor sleep hygiene 06/19/2020   Panlobular emphysema (HCC) 06/19/2020   Tobacco use 06/19/2020   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) 06/19/2020   At risk for sleep apnea 06/19/2020   OSA and COPD overlap syndrome (HCC) 06/19/2020   Chronic intermittent hypoxia with obstructive sleep apnea 06/19/2020   Hypertensive emergency 12/07/2019   Hyperlipidemia 12/07/2019   Controlled type 2 diabetes mellitus without complication, without long-term current use of insulin  (HCC) 12/07/2019   Cognitive decline 12/07/2019   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 [place] 12/07/2019   CKD (chronic kidney disease), stage II 12/07/2019   Hypothyroidism s/p  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 12/07/2019   Polycythemia secondary to smoking 12/07/2019   ICH (intracerebral hemorrhage) (HCC) - L parietal hypertensive hmg 12/05/2019   Encephalopathy acute 10/19/2018    Palliative Care Assessment & Plan   Patient Profile: 78 year old gentleman with history of cognitive impairment diabetes hypertension hyperlipidemia end-stage COPD receiving [facility] hospice support at home, stage II chronic kidney disease history of thyroid  cancer OSA not on CPAP history of ICH due to hypertension, history of chronic respiratory failure on supplemental oxygen  via nasal cannula Home hospice patient Admitted after a fall Found to have a left transverse patellar fracture Possible concern for upper GI bleed Found to have newly diagnosed lung mass on imaging  Assessment: Functional decline Shortness of breath Hospice patient   Recommendations/Plan: Initial palliative care consult note reviewed, agree with DNR/comfort measures.  Patient already followed by [facility] hospice Medication history reviewed  Chart reviewed, discharge summary noted, patient to go to [facility].  Code Status:    Code Status Orders  (From admission, onward)           Start     Ordered   01/17/24 1607  Do not attempt resuscitation (DNR) - Comfort care  Continuous       Question Answer Comment  If patient has no pulse and is not breathing Do Not Attempt Resuscitation   In Pre-Arrest Conditions (Patient Is Breathing and Has a Pulse) Provide comfort measures. Relieve any mechanical airway obstruction. Avoid transfer unless required for comfort.   Consent: Discussion documented in EHR or advanced directives reviewed      01/17/24 1606           Code Status History     Date Active Date Inactive Code Status Order ID Comments User Context   01/16/2024 1828 01/17/2024 1606 Limited: Do not attempt resuscitation (DNR) -DNR-LIMITED -Do Not Intubate/DNI  [id number]  Waddell Rake, MD ED    12/05/2019 1732 12/07/2019 1738 Full Code [id number]  Michaela Aisha SQUIBB, MD ED   10/19/2018 1841 10/24/2018 1731 Full Code [id number]  Loreta Levels, MD ED      Advance Directive Documentation  Flowsheet Row Most Recent Value  Type of Advance Directive Living will  Pre-existing out of facility DNR order (yellow form or pink MOST form) --  MOST Form in Place? --    Prognosis:  Likely 2 weeks.   Discharge Planning:   Hospice facility.    Care plan was discussed with patient, son and grand daughter at bedside.   Thank you for allowing the Palliative Medicine Team to assist in the care of this patient.high MDM     Greater than 50%  of this time was spent counseling and coordinating care related to the above assessment and plan.  Lonia Serve, MD  Please contact Palliative Medicine Team phone at [phone] for questions and concerns.

## 2024-01-20 NOTE — Discharge Summary (Signed)
 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  Patient seen in the ED few days prior to admission noted to have a fractured patella and sent home with outpatient follow-up with orthopedics.  Patient presented back as unable to tolerate his brace and unable to take care of himself and bear weight.  Also noted was concerns for possible melanotic stools and diarrhea.  Patient also with some concerns of some hemoptysis.  Chest x-ray done concerning for pulmonary mass.  FOBT noted to be positive.  CT chest done for further evaluation of pulmonary mass.  Orthopedics, GI, palliative care  consulted. Palliative care met with family and decision made to transition to full comfort measures.  I discussed with patient's daughter and she is in agreement for discharge today to hospice facility for symptom management.    Consultants: Hospice and palliative Procedures performed: None Disposition: Hospice care Diet recommendation:  As recommended by hospice team DISCHARGE MEDICATION: Allergies as of 01/20/2024   No Known Allergies      Medication List     STOP taking these medications    Ozempic (0.25 or 0.5 MG/DOSE) 2 MG/1.5ML Sopn Generic drug: Semaglutide(0.25 or 0.5MG /DOS)   traMADol  50 MG tablet Commonly known as: ULTRAM        TAKE these medications    acetaminophen  325 MG tablet Commonly known as: TYLENOL  Take 2 tablets (650 mg total) by mouth every 6 (six) hours as needed for mild pain (pain score 1-3) or fever (or Fever >/= 101). What changed:  medication strength how much to take when to take this reasons to take this   albuterol  108 (90 Base) MCG/ACT inhaler Commonly known as: VENTOLIN  HFA Inhale 2 puffs into the lungs every 4 (four) hours as needed for wheezing or shortness of breath.   amLODipine  5 MG tablet Commonly known as: NORVASC  Take 1 tablet (5 mg total) by mouth daily. Start taking on: January 21, 2024   atorvastatin  40 MG tablet Commonly known as: LIPITOR TAKE 1 TABLET BY MOUTH DAILY   furosemide 40 MG tablet Commonly known as: LASIX Take 40 mg by mouth 2 (two) times daily.  haloperidol  2 MG/ML solution Commonly known as: HALDOL  Place 0.5 mLs (1 mg total) under the tongue every 4 (four) hours as needed for agitation (or delirium).   ipratropium-albuterol  0.5-2.5 (3) MG/3ML Soln Commonly known as: DUONEB Take 3 mLs by nebulization every 6 (six) hours as needed (wheezing/sob).   Jardiance 10 MG Tabs tablet Generic drug: empagliflozin Take 10 mg by mouth daily.   levothyroxine  200 MCG tablet Commonly known as:  SYNTHROID  Take 200 mcg by mouth daily before breakfast.   lisinopril  40 MG tablet Commonly known as: ZESTRIL  Take 1 tablet (40 mg total) by mouth daily. What changed:  how much to take when to take this   metFORMIN  750 MG 24 hr tablet Commonly known as: GLUCOPHAGE -XR Take 750 mg by mouth in the morning and at bedtime.   metolazone 2.5 MG tablet Commonly known as: ZAROXOLYN Take 2.5 mg by mouth 2 (two) times a week. Take 2.5 mg by mouth twice a day on Monday and Thursday   morphine  CONCENTRATE 10 mg / 0.5 ml concentrated solution TAKE 5 MG (5MG =0.25ML) BY MOUTH EVERY 4 HOURS AS NEEDED   pantoprazole  40 MG tablet Commonly known as: PROTONIX  Take 1 tablet (40 mg total) by mouth 2 (two) times daily before a meal.   Symbicort 160-4.5 MCG/ACT inhaler Generic drug: budesonide-formoterol SMARTSIG:2 Puff(s) By Mouth Every 12 Hours        Discharge Exam: Filed Weights   01/16/24 1032 01/16/24 2001  Weight: 98.9 kg 105.5 kg   General exam: NAD. Respiratory system: Decreased air entry bibasilarly Cardiovascular system: RRR no murmurs rubs or gallops.  No JVD.  No pitting lower extremity edema.  Gastrointestinal system: Abdomen is soft, obese, nontender to palpation, positive bowel sounds.  No rebound.  No guarding.  Central nervous system: Patient only minimally conversant confused Extremities: Left lower extremity in knee immobilizer.  Left upper extremity shoulder dislocation. Skin: No rashes, lesions or ulcers Psychiatry: Judgement and insight appear poor. Mood & affect appropriate.   Condition at discharge: good  The results of significant diagnostics from this hospitalization (including imaging, microbiology, ancillary and laboratory) are listed below for reference.   Imaging Studies: DG Knee 1-2 Views Left Result Date: 01/18/2024 CLINICAL DATA:  Patellar fracture. EXAM: LEFT KNEE - 1-2 VIEW COMPARISON:  01/16/2024. FINDINGS: Similar alignment of a transverse patellar  fracture. Knee joint effusion. Prepatellar soft tissue swelling. External brace in place. IMPRESSION: Similar alignment of a transverse patellar fracture with knee joint effusion. Electronically Signed   By: Harrietta Sherry M.D.   On: 01/18/2024 11:24   DG Shoulder Left Result Date: 01/18/2024 CLINICAL DATA:  Dislocation. EXAM: LEFT SHOULDER - 2+ VIEW COMPARISON:  None Available. FINDINGS: Anterior dislocation of the left humeral head relative to the glenoid. Mild contour flattening of the superolateral humeral head could reflect a Hill-Sachs defect. The acromioclavicular joint is anatomically aligned with mild-to-moderate degenerative arthropathy. IMPRESSION: 1. Anterior dislocation of the left glenohumeral joint. Mild contour flattening of the superolateral humeral head could reflect a Hill-Sachs defect. 2. Mild-to-moderate degenerative arthropathy of the acromioclavicular joint. Electronically Signed   By: Harrietta Sherry M.D.   On: 01/18/2024 11:21   CT CHEST W CONTRAST Result Date: 01/17/2024 CLINICAL DATA:  Left lung mass on radiography, for further imaging workup EXAM: CT CHEST WITH CONTRAST TECHNIQUE: Multidetector CT imaging of the chest was performed during intravenous contrast administration. RADIATION DOSE REDUCTION: This exam was performed according to the departmental dose-optimization program which includes automated exposure control, adjustment of the  mA and/or kV according to patient size and/or use of iterative reconstruction technique. CONTRAST:  75mL OMNIPAQUE  IOHEXOL  300 MG/ML  SOLN COMPARISON:  01/16/2024 FINDINGS: Cardiovascular: Coronary, aortic arch, and branch vessel atherosclerotic vascular disease. Mild cardiomegaly. Mediastinum/Nodes: Left hilar node 1.1 cm in short axis, image 20 series 3. Left infrahilar node 1.2 cm in short axis, image 27 series 3. Right and left paratracheal lymph nodes are not pathologically enlarged by size criteria. Lungs/Pleura: 8.3 by 6.5 by 7.2 cm left  upper lobe mass partially abutting and mildly displacing the minor fissure and also the lateral and cardiac pleural margins. This mass is associated with truncation and obstruction of the anterior segmental bronchus of the left upper lobe. Adjacent postobstructive atelectasis or pneumonitis. The top differential diagnostic consideration is primary lung malignancy. Emphysema noted. Dependent atelectasis or scarring in both lower lobes with a bulla in the posterior basal segment right lower lobe. Airway thickening is present, suggesting bronchitis or reactive airways disease. Mild secondary pulmonary lobular interstitial accentuation in the lung apices, this can be encountered in the setting of mild interstitial edema but is technically nonspecific. Upper Abdomen: Fullness of both adrenal glands without a well-defined adrenal mass. Mild lobularity of the liver, nonspecific. Suspected gallstone in the neck of the gallbladder 2.8 cm diameter. Musculoskeletal: Thoracic spondylosis. IMPRESSION: 1. 8.3 by 6.5 by 7.2 cm left upper lobe mass partially abutting and mildly displacing the minor fissure and also the lateral and cardiac pleural margins. This mass is associated with truncation and obstruction of the anterior segmental bronchus of the left upper lobe. Adjacent postobstructive atelectasis or pneumonitis. The top differential diagnostic consideration is primary lung malignancy. Pulmonary and/or oncology consultation recommended. 2. Mildly enlarged left hilar and left infrahilar lymph nodes. 3. Fullness of both adrenal glands without a well-defined adrenal mass. 4. Mild lobularity of the liver, nonspecific. 5. Suspected gallstone in the neck of the gallbladder 2.8 cm diameter. 6. Airway thickening is present, suggesting bronchitis or reactive airways disease. 7. Mild secondary pulmonary lobular interstitial accentuation in the lung apices, this can be encountered in the setting of mild interstitial edema but is  technically nonspecific. 8.  Aortic Atherosclerosis (ICD10-I70.0). Electronically Signed   By: Ryan Salvage M.D.   On: 01/17/2024 15:33   CT Cervical Spine Wo Contrast Result Date: 01/16/2024 CLINICAL DATA:  78 year old male with recurrent falls. EXAM: CT CERVICAL SPINE WITHOUT CONTRAST TECHNIQUE: Multidetector CT imaging of the cervical spine was performed without intravenous contrast. Multiplanar CT image reconstructions were also generated. RADIATION DOSE REDUCTION: This exam was performed according to the departmental dose-optimization program which includes automated exposure control, adjustment of the mA and/or kV according to patient size and/or use of iterative reconstruction technique. COMPARISON:  Head CT today.  Cervical spine CT 01/14/2024. FINDINGS: Alignment: Stable reversal of cervical lordosis. Cervicothoracic junction alignment is within normal limits. Bilateral posterior element alignment is within normal limits. Skull base and vertebrae: Visualized skull base is intact. No atlanto-occipital dissociation. C1 and C2 appear intact and aligned. No acute osseous abnormality identified. Soft tissues and spinal canal: Calcified cervical carotid atherosclerosis. Otherwise negative visible noncontrast neck soft tissues. Disc levels: Chronic cervical spine degeneration, with developing degenerative facet ankylosis on the left at C3-C4. Chronic disc and endplate degeneration maximal at C5-C6. Upper chest: Visible upper thoracic levels appear intact. Generalized pulmonary septal thickening in the lung apices appears stable, nonspecific. And somewhat similar to 2021 neck CTA appearance. IMPRESSION: 1. No acute traumatic injury identified in the cervical spine. 2. Chronic  cervical spine degeneration. 3. 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 Electronically Signed   By: VEAR Hurst M.D.   On: 01/16/2024 12:14   CT Head Wo Contrast Result  Date: 01/16/2024 CLINICAL DATA:  78 year old male with recurrent falls. EXAM: CT HEAD WITHOUT CONTRAST TECHNIQUE: Contiguous axial images were obtained from the base of the skull through the vertex without intravenous contrast. RADIATION DOSE REDUCTION: This exam was performed according to the departmental dose-optimization program which includes automated exposure control, adjustment of the mA and/or kV according to patient size and/or use of iterative reconstruction technique. COMPARISON:  Brain MRI 12/05/2019.  Head CT 01/14/2024. FINDINGS: Brain: No midline shift, ventriculomegaly, mass effect, evidence of mass lesion, intracranial hemorrhage or evidence of cortically based acute infarction. Confluent bilateral cerebral white matter hypodensity is stable. Vascular: Calcified atherosclerosis at the skull base. No suspicious intracranial vascular hyperdensity. Skull: Stable, no acute fracture identified. Nonspecific multiple small but circumscribed lucent areas at the skull vertex, but these appears stable since at least 2021 and benign. Sinuses/Orbits: Visualized paranasal sinuses and mastoids are stable and well aerated. Other: No acute orbit or scalp soft tissue injury identified. IMPRESSION: 1. No acute intracranial abnormality or acute traumatic injury identified. 2. Stable chronic white matter disease. Electronically Signed   By: VEAR Hurst M.D.   On: 01/16/2024 12:11   DG Knee Complete 4 Views Left Result Date: 01/16/2024 CLINICAL DATA:  Patellar fracture EXAM: LEFT KNEE - COMPLETE 4+ VIEW COMPARISON:  01/14/2024 FINDINGS: Stable transverse patellar fracture and knee effusion. Prepatellar soft tissue swelling. No new fracture identified. IMPRESSION: 1. Stable transverse patellar fracture and knee effusion. Prepatellar soft tissue swelling. Electronically Signed   By: Ryan Salvage M.D.   On: 01/16/2024 12:07   DG Femur Min 2 Views Left Result Date: 01/16/2024 CLINICAL DATA:  Fall, hip and knee pain  EXAM: LEFT FEMUR 2 VIEWS COMPARISON:  None Available. FINDINGS: Atheromatous vascular calcifications. Knee effusion observed with a transverse patellar fracture. Cross-table lateral view of the proximal femur severely suboptimal related to patient mobility and body habitus issues. IMPRESSION: 1. Transverse patellar fracture with knee effusion. 2. Atheromatous vascular calcifications. 3. Severely suboptimal cross-table lateral view of the proximal femur related to patient mobility and body habitus issues. Electronically Signed   By: Ryan Salvage M.D.   On: 01/16/2024 12:02   DG Pelvis Portable Result Date: 01/16/2024 CLINICAL DATA:  Fall, left hip pain EXAM: PORTABLE PELVIS 1-2 VIEWS COMPARISON:  None Available. FINDINGS: Limited assessment due to patient body habitus and difficulty positioning related to mobility and pain. In particular, there is poor definition of portions of the left iliac bone and left hemipelvis. The pelvis is mildly rotated to the left. No overt cortical discontinuity characteristic fracture is identified. IMPRESSION: 1. Limited assessment due to patient body habitus and difficulty positioning related to mobility and pain. In particular, there is poor definition of portions of the left iliac bone and left hemipelvis. No overt cortical discontinuity characteristic of fracture is identified. Electronically Signed   By: Ryan Salvage M.D.   On: 01/16/2024 12:00   DG Chest Portable 1 View Result Date: 01/16/2024 CLINICAL DATA:  Fall during transport to bedside commode. History of patellar fracture due to a prior fall. EXAM: PORTABLE CHEST 1 VIEW COMPARISON:  12/05/2019 FINDINGS: New masslike density projecting over the lingula measuring proximally 9.6 by 7.7 cm. Malignancy is not excluded and chest CT (with contrast if feasible) is recommended for further characterization.  Emphysema noted. Mild enlargement of the cardiopericardial silhouette. Aortic atherosclerosis noted. Airway  thickening is present, suggesting bronchitis or reactive airways disease. IMPRESSION: 1. New masslike density projecting over the lingula measuring 9.6 x 7.7 cm. Malignancy such as lung cancer is not excluded and chest CT (with contrast if feasible) is recommended for further characterization. 2. Airway thickening is present, suggesting bronchitis or reactive airways disease. 3. Mild enlargement of the cardiopericardial silhouette. 4. Aortic Atherosclerosis (ICD10-I70.0) and Emphysema (ICD10-J43.9). Electronically Signed   By: Ryan Salvage M.D.   On: 01/16/2024 11:59   DG Foot 2 Views Left Result Date: 01/14/2024 CLINICAL DATA:  Fall EXAM: LEFT FOOT - 2 VIEW COMPARISON:  None Available. FINDINGS: No acute bony abnormality. Specifically, no fracture, subluxation, or dislocation. Plantar calcaneal spur. Soft tissues are intact. IMPRESSION: No acute bony abnormality. Electronically Signed   By: Franky Crease M.D.   On: 01/14/2024 00:56   DG Knee Complete 4 Views Left Result Date: 01/14/2024 CLINICAL DATA:  Fall, pain EXAM: LEFT KNEE - COMPLETE 4+ VIEW COMPARISON:  None Available. FINDINGS: There is a mid patellar fracture with mild displacement. Overlying soft tissue swelling. Small joint effusion. No subluxation or dislocation. IMPRESSION: Displaced patellar fracture.  Associated joint effusion. Electronically Signed   By: Franky Crease M.D.   On: 01/14/2024 00:55   CT Cervical Spine Wo Contrast Result Date: 01/14/2024 CLINICAL DATA:  Neck trauma (Age >= 65y).  Fall. EXAM: CT CERVICAL SPINE WITHOUT CONTRAST TECHNIQUE: Multidetector CT imaging of the cervical spine was performed without intravenous contrast. Multiplanar CT image reconstructions were also generated. RADIATION DOSE REDUCTION: This exam was performed according to the departmental dose-optimization program which includes automated exposure control, adjustment of the mA and/or kV according to patient size and/or use of iterative reconstruction  technique. COMPARISON:  None Available. FINDINGS: Alignment: No subluxation. Skull base and vertebrae: No acute fracture. No primary bone lesion or focal pathologic process. Soft tissues and spinal canal: No prevertebral fluid or swelling. No visible canal hematoma. Disc levels: Disc space narrowing and anterior spurring at C4-5 and C5-6. Bilateral degenerative facet disease, left greater than right. Upper chest: No acute findings Other: None IMPRESSION: Degenerative disc and facet disease. No acute bony abnormality. Electronically Signed   By: Franky Crease M.D.   On: 01/14/2024 00:55   CT Head Wo Contrast Result Date: 01/14/2024 CLINICAL DATA:  Head trauma, minor (Age >= 65y).  Fall. EXAM: CT HEAD WITHOUT CONTRAST TECHNIQUE: Contiguous axial images were obtained from the base of the skull through the vertex without intravenous contrast. RADIATION DOSE REDUCTION: This exam was performed according to the departmental dose-optimization program which includes automated exposure control, adjustment of the mA and/or kV according to patient size and/or use of iterative reconstruction technique. COMPARISON:  12/06/2019 FINDINGS: Brain: There is atrophy and chronic small vessel disease changes. No acute intracranial abnormality. Specifically, no hemorrhage, hydrocephalus, mass lesion, acute infarction, or significant intracranial injury. Old left basal ganglia lacunar infarct, stable. Vascular: No hyperdense vessel or unexpected calcification. Skull: No acute calvarial abnormality. Sinuses/Orbits: No acute findings Other: None IMPRESSION: Atrophy, chronic microvascular disease. No acute intracranial abnormality. Electronically Signed   By: Franky Crease M.D.   On: 01/14/2024 00:53    Microbiology: Results for orders placed or performed during the hospital encounter of 11/01/20  Culture, blood (routine x 2)     Status: None   Collection Time: 11/01/20 11:30 AM   Specimen: BLOOD  Result Value Ref Range Status    Specimen Description  Final    BLOOD RIGHT ANTECUBITAL Performed at [facility], [address] [place], [place] [address]    Special Requests   Final    BOTTLES DRAWN AEROBIC AND ANAEROBIC Blood Culture results may not be optimal due to an inadequate volume of blood received in culture bottles Performed at [facility], [address], [place], [place] [address]    Culture   Final    NO GROWTH 5 DAYS Performed at [facility], [address] [place], [place] [address]    Report Status 11/06/2020 FINAL  Final    Labs: CBC: Recent Labs  Lab 01/16/24 1044 01/16/24 1715 01/16/24 2336 01/17/24 0400 01/18/24 0853 01/19/24 0352  WBC 10.9* 11.7* 12.1* 11.2* 11.3* 12.0*  NEUTROABS 8.8*  --   --   --  8.9*  --   HGB 9.5* 9.2* 8.9* 9.1* 9.4* 9.3*  HCT 31.6* 31.0* 30.0* 29.5* 30.9* 31.5*  MCV 94.9 96.0 95.2 95.5 94.8 94.3  PLT 471* 484* 472* 449* 458* 529*   Basic Metabolic Panel: Recent Labs  Lab 01/16/24 1044 01/17/24 0625 01/18/24 0853 01/19/24 0352  NA 137 134* 133* 133*  K 3.5 3.7 3.6 3.7  CL 91* 90* 91* 89*  CO2 34* 29 31 27   GLUCOSE 138* 127* 134* 134*  BUN 34* 29* 28* 25*  CREATININE 1.51* 1.21 1.04 1.22  CALCIUM  7.5* 7.6* 7.8* 8.1*   Liver Function Tests: Recent Labs  Lab 01/16/24 1044  AST 25  ALT 16  ALKPHOS 110  BILITOT 0.2  PROT 6.5  ALBUMIN 3.1*   CBG: Recent Labs  Lab 01/16/24 2002 01/17/24 0733 01/17/24 1308 01/17/24 1628 01/17/24 2054  GLUCAP 167* 135* 168* 128* 149*    Discharge time spent:  35 minutes.  Signed: Drue ONEIDA Potter, MD [facility] 01/20/2024

## 2024-01-20 NOTE — Progress Notes (Signed)
 I do long discussion with patient and his daughters.  We elected to perform an attempted closed reduction of his left shoulder dislocation he did not wish to undergo lidocaine  injection.  Attempted reduction was performed with traction and countertraction patient tolerated this mildly well he did not wish to undergo any further manipulation and elected to leave the shoulder dislocated I did discuss the risks of contracture with him and his daughter  Unstable shoulder was unable to be reduced close patient and family elected to cease manipulation and hold off on any further attempt.  He can do sling for comfort but is not necessary if he is more comfortable out of the sling   Evalene JONETTA Chancy

## 2024-01-20 NOTE — Progress Notes (Signed)
 Chaplain visited with pt Matthew [name], who appeared uncomfortable. Sitter bedside. Matthew [name] was unable to converse much at this time. The Sitter kindly confirmed to me that she would reach out to the nurse to contact us  when family arrives, if they would welcome a visit. We also remain available as other needs arise.

## 2024-02-03 DEATH — deceased
